# Patient Record
Sex: Male | Born: 1955 | Race: White | Hispanic: No | Marital: Married | Smoking: Never smoker
Health system: Southern US, Community
[De-identification: ages and names within clinical notes are randomized; demographics above are authoritative.]

## PROBLEM LIST (undated history)

## (undated) ENCOUNTER — Encounter: Attending: Adult Health | Primary: Adult Health

## (undated) ENCOUNTER — Ambulatory Visit

## (undated) ENCOUNTER — Ambulatory Visit: Payer: Medicare (Managed Care) | Attending: Adult Health | Primary: Adult Health

## (undated) ENCOUNTER — Ambulatory Visit: Payer: Medicare (Managed Care)

## (undated) ENCOUNTER — Encounter

## (undated) ENCOUNTER — Other Ambulatory Visit

## (undated) ENCOUNTER — Telehealth

## (undated) ENCOUNTER — Encounter: Attending: Physician Assistant | Primary: Physician Assistant

## (undated) ENCOUNTER — Ambulatory Visit: Payer: Medicare (Managed Care) | Attending: Hematology & Oncology | Primary: Hematology & Oncology

## (undated) ENCOUNTER — Encounter: Attending: Hematology & Oncology | Primary: Hematology & Oncology

## (undated) ENCOUNTER — Ambulatory Visit: Attending: Adult Health | Primary: Adult Health

## (undated) ENCOUNTER — Ambulatory Visit: Payer: PRIVATE HEALTH INSURANCE

## (undated) ENCOUNTER — Telehealth: Attending: Emergency Medicine | Primary: Emergency Medicine

## (undated) ENCOUNTER — Telehealth: Attending: Adult Health | Primary: Adult Health

## (undated) ENCOUNTER — Ambulatory Visit: Attending: Family Medicine | Primary: Family Medicine

## (undated) ENCOUNTER — Ambulatory Visit: Payer: PRIVATE HEALTH INSURANCE | Attending: Adult Health | Primary: Adult Health

## (undated) ENCOUNTER — Telehealth: Attending: Family Medicine | Primary: Family Medicine

## (undated) ENCOUNTER — Ambulatory Visit
Payer: Medicare (Managed Care) | Attending: Student in an Organized Health Care Education/Training Program | Primary: Student in an Organized Health Care Education/Training Program

## (undated) ENCOUNTER — Other Ambulatory Visit: Attending: Pharmacist | Primary: Pharmacist

## (undated) ENCOUNTER — Other Ambulatory Visit: Attending: Hematology & Oncology | Primary: Hematology & Oncology

## (undated) ENCOUNTER — Ambulatory Visit: Payer: MEDICARE | Attending: Adult Health | Primary: Adult Health

## (undated) ENCOUNTER — Ambulatory Visit: Payer: BLUE CROSS/BLUE SHIELD | Attending: Hematology & Oncology | Primary: Hematology & Oncology

## (undated) DIAGNOSIS — M1990 Unspecified osteoarthritis, unspecified site: Secondary | ICD-10-CM

## (undated) DIAGNOSIS — I10 Essential (primary) hypertension: Secondary | ICD-10-CM

## (undated) DIAGNOSIS — R7989 Other specified abnormal findings of blood chemistry: Secondary | ICD-10-CM

## (undated) DIAGNOSIS — E119 Type 2 diabetes mellitus without complications: Secondary | ICD-10-CM

## (undated) DIAGNOSIS — Z87442 Personal history of urinary calculi: Secondary | ICD-10-CM

## (undated) DIAGNOSIS — Z9989 Dependence on other enabling machines and devices: Secondary | ICD-10-CM

## (undated) DIAGNOSIS — G4733 Obstructive sleep apnea (adult) (pediatric): Secondary | ICD-10-CM

## (undated) HISTORY — DX: Dependence on other enabling machines and devices: Z99.89

## (undated) HISTORY — PX: COLONOSCOPY: SHX174

## (undated) HISTORY — PX: NOSE SURGERY: SHX723

## (undated) HISTORY — DX: Type 2 diabetes mellitus without complications: E11.9

## (undated) HISTORY — DX: Obstructive sleep apnea (adult) (pediatric): G47.33

## (undated) HISTORY — PX: LITHOTRIPSY: SUR834

## (undated) HISTORY — PX: HERNIA REPAIR: SHX51

---

## 2004-07-06 ENCOUNTER — Ambulatory Visit: Payer: Self-pay | Admitting: Internal Medicine

## 2004-07-06 ENCOUNTER — Ambulatory Visit (HOSPITAL_BASED_OUTPATIENT_CLINIC_OR_DEPARTMENT_OTHER): Admission: RE | Admit: 2004-07-06 | Discharge: 2004-07-06 | Payer: Self-pay | Admitting: Family Medicine

## 2004-07-07 ENCOUNTER — Ambulatory Visit (HOSPITAL_COMMUNITY): Admission: RE | Admit: 2004-07-07 | Discharge: 2004-07-07 | Payer: Self-pay | Admitting: Family Medicine

## 2004-12-19 ENCOUNTER — Other Ambulatory Visit: Payer: Self-pay

## 2004-12-19 ENCOUNTER — Emergency Department: Payer: Self-pay | Admitting: Internal Medicine

## 2004-12-22 ENCOUNTER — Ambulatory Visit: Payer: Self-pay | Admitting: Internal Medicine

## 2006-03-07 HISTORY — PX: KNEE ARTHROCENTESIS: SUR44

## 2007-10-04 ENCOUNTER — Ambulatory Visit (HOSPITAL_BASED_OUTPATIENT_CLINIC_OR_DEPARTMENT_OTHER): Admission: RE | Admit: 2007-10-04 | Discharge: 2007-10-04 | Payer: Self-pay | Admitting: Orthopedic Surgery

## 2007-11-30 ENCOUNTER — Ambulatory Visit: Payer: Self-pay | Admitting: Urology

## 2007-12-21 ENCOUNTER — Ambulatory Visit: Payer: Self-pay | Admitting: Urology

## 2007-12-27 ENCOUNTER — Ambulatory Visit: Payer: Self-pay | Admitting: Urology

## 2008-01-02 ENCOUNTER — Ambulatory Visit: Payer: Self-pay | Admitting: Urology

## 2008-01-09 ENCOUNTER — Ambulatory Visit: Payer: Self-pay | Admitting: Urology

## 2008-04-11 ENCOUNTER — Ambulatory Visit: Payer: Self-pay | Admitting: Urology

## 2009-01-14 ENCOUNTER — Ambulatory Visit: Payer: Self-pay | Admitting: Urology

## 2009-02-19 ENCOUNTER — Ambulatory Visit: Payer: Self-pay | Admitting: Urology

## 2009-07-01 IMAGING — CR DG ABDOMEN 1V
1 series · 2 of 2 positions shown · non-contrast
Comparison: none

REASON FOR EXAM: nephrolithiasis pt need films
COMMENTS:

[Series 1: view not recorded · 0.17mm/px · 2 of 2 slices shown]
[im 1/2]
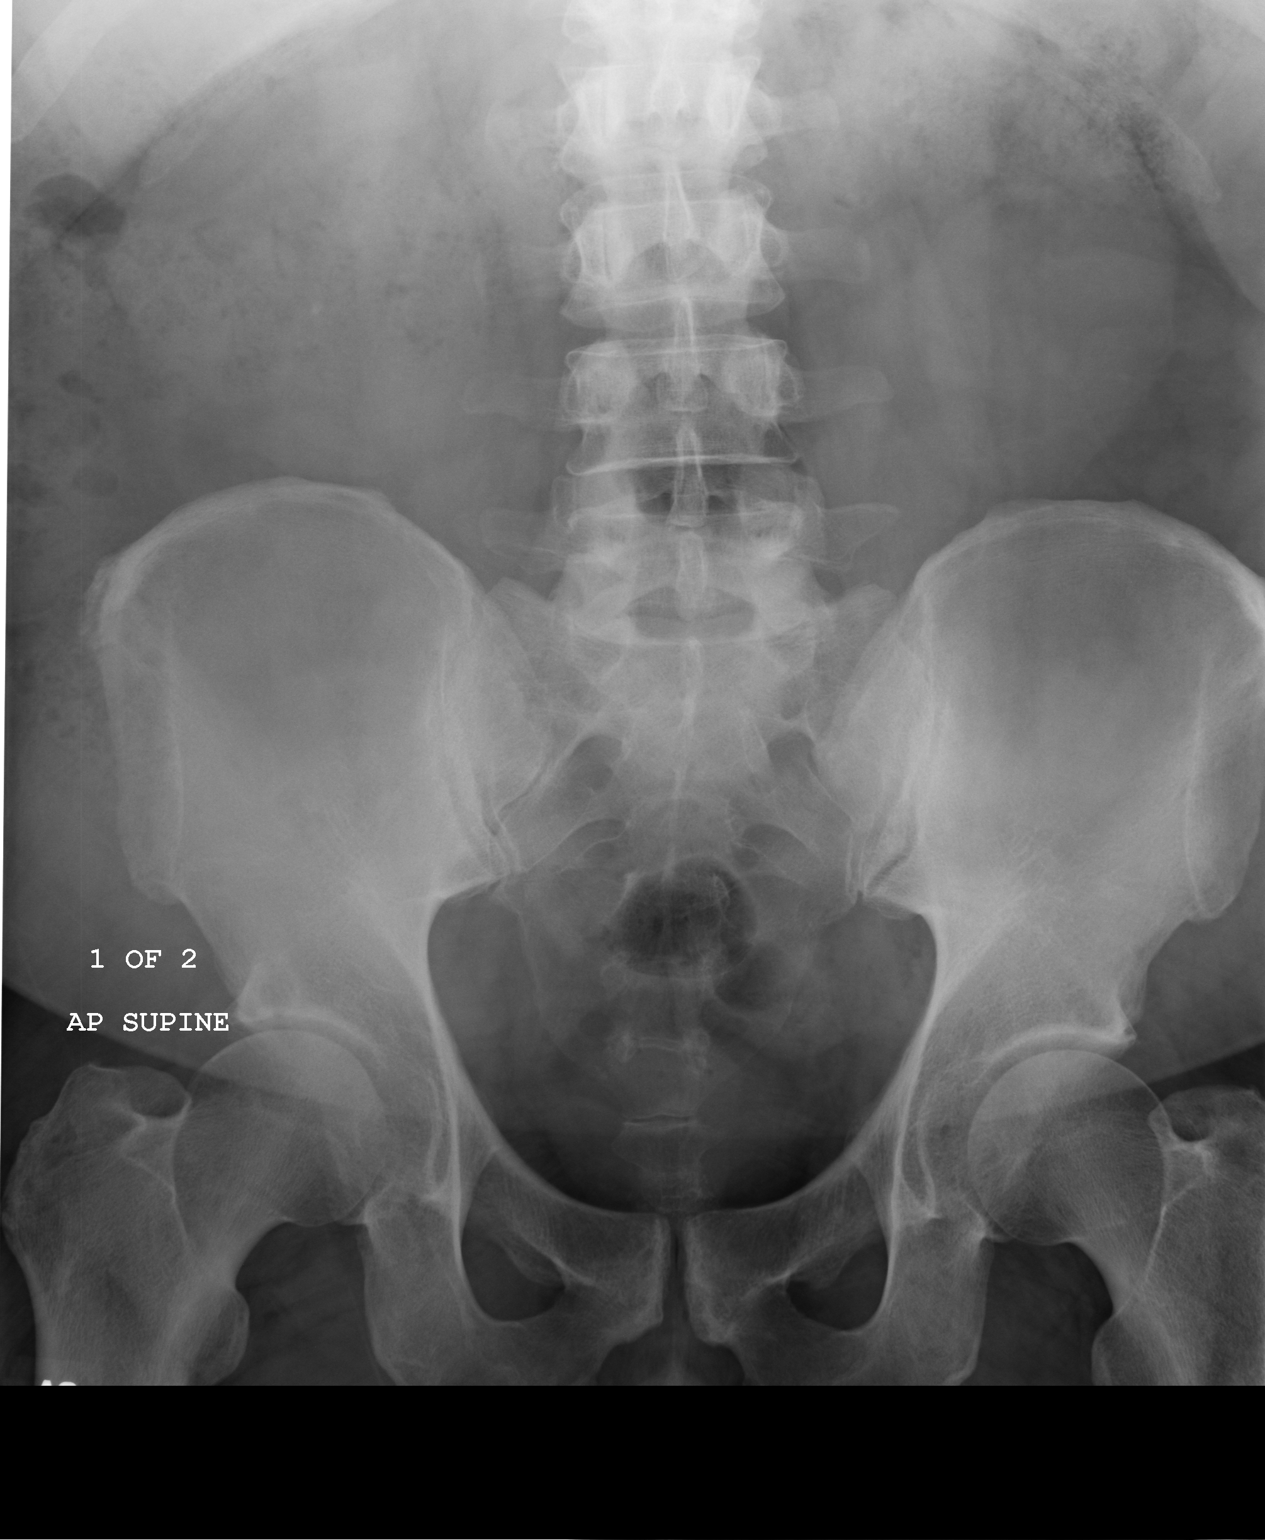
[im 2/2]
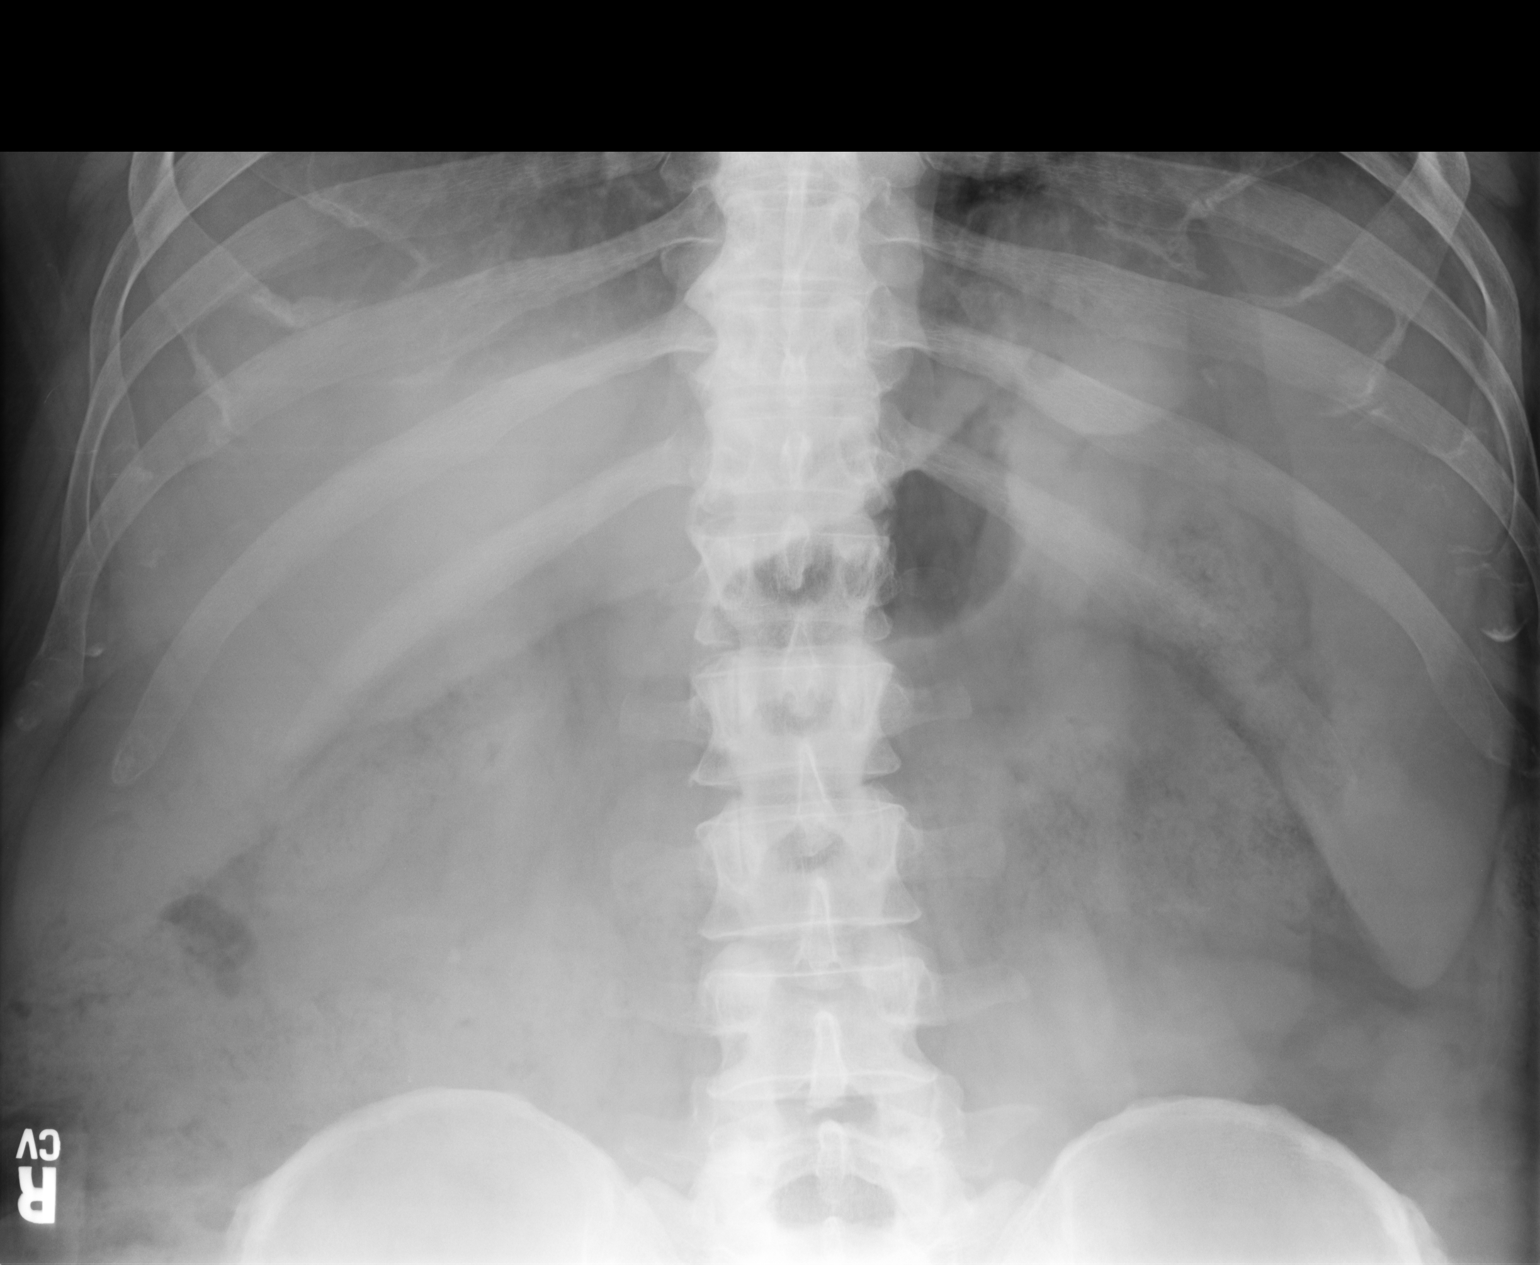

[2 of 2 positions shown; findings below may reference images not displayed]

PROCEDURE:     DXR - DXR KIDNEY URETER BLADDER  - January 02, 2008 [DATE]

RESULT:     There is a 5 mm calcification projected over the lower mid pole
region of the RIGHT kidney and consistent with a RIGHT renal stone. The
calcification observed medial to the RIGHT kidney on the exam of 12-27-2007
is no longer seen. No LEFT renal calcifications are identified. On the
current exam no definite ureteral calcifications are seen.
IMPRESSION: 1.     RIGHT nephrolithiasis.

## 2009-07-08 IMAGING — CR DG ABDOMEN 1V
1 series · 2 of 2 positions shown · non-contrast
Comparison: none

REASON FOR EXAM: NEPHROLITHIASIS PT NEED FILMS
COMMENTS:

[Series 1: view not recorded · 0.17mm/px · 2 of 2 slices shown]
[im 1/2]
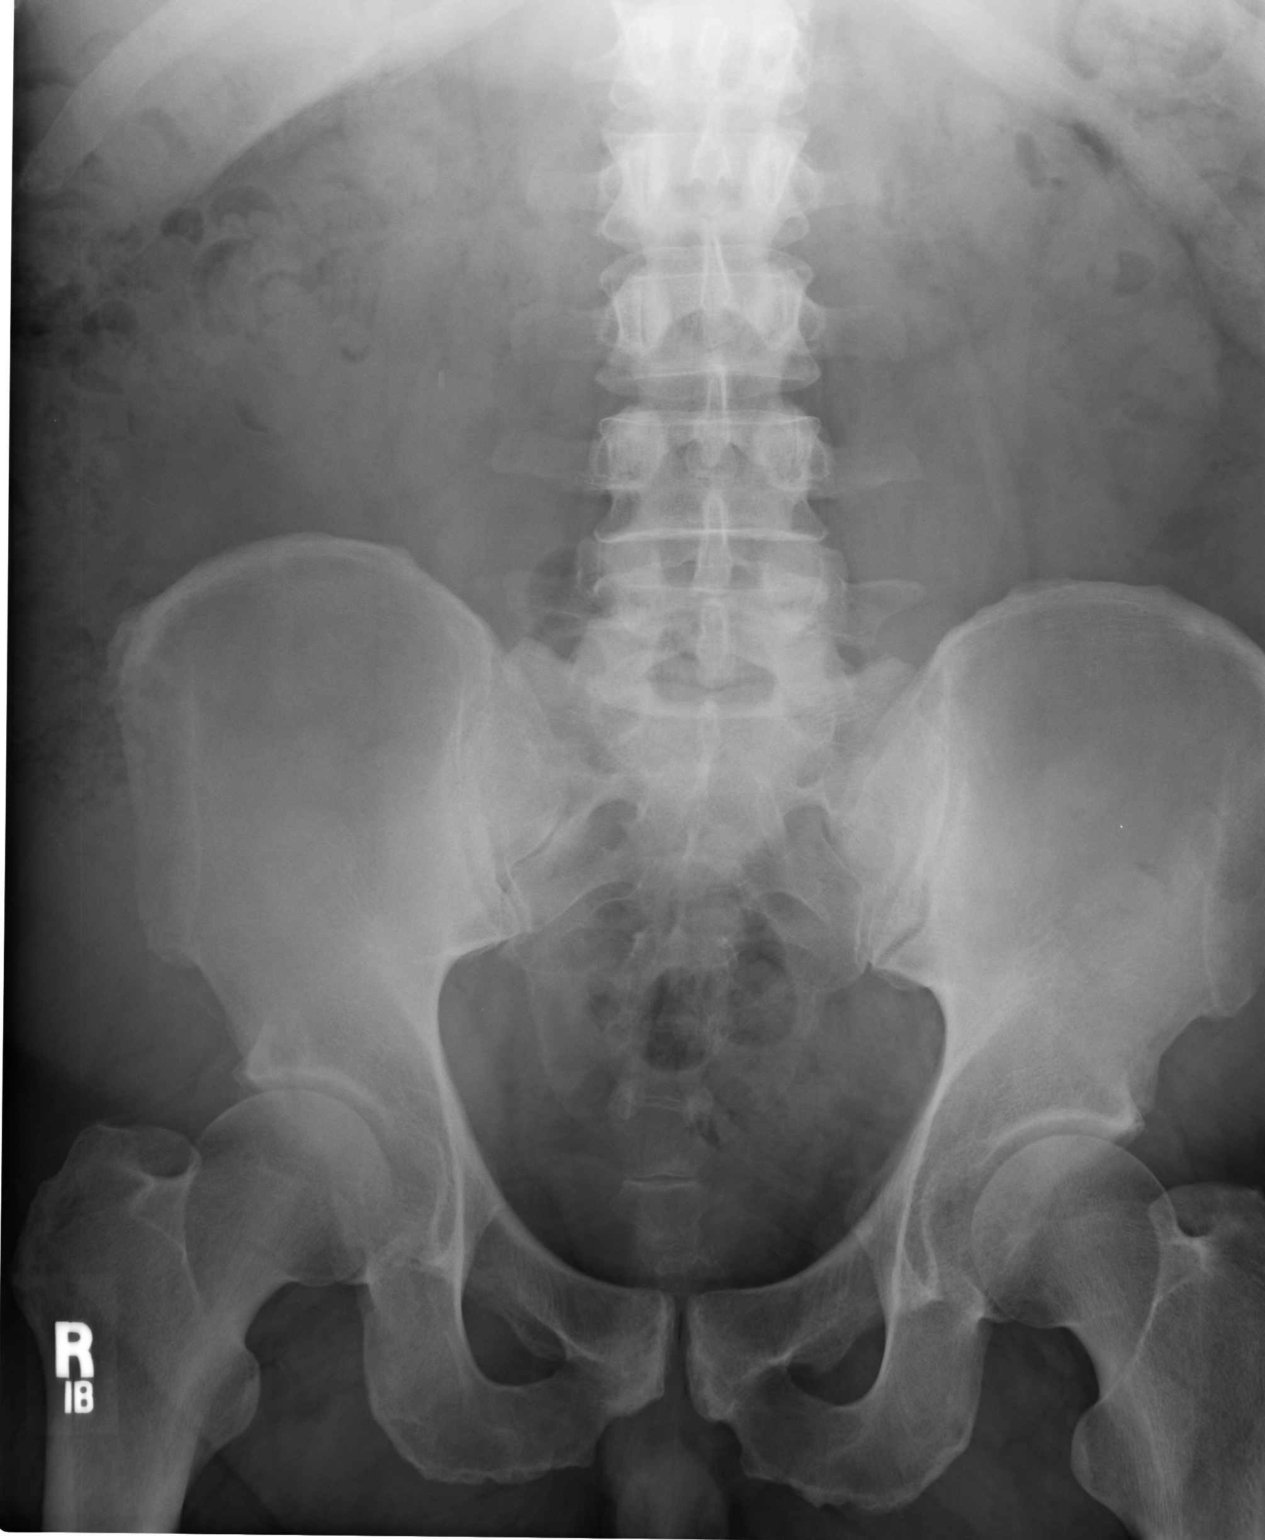
[im 2/2]
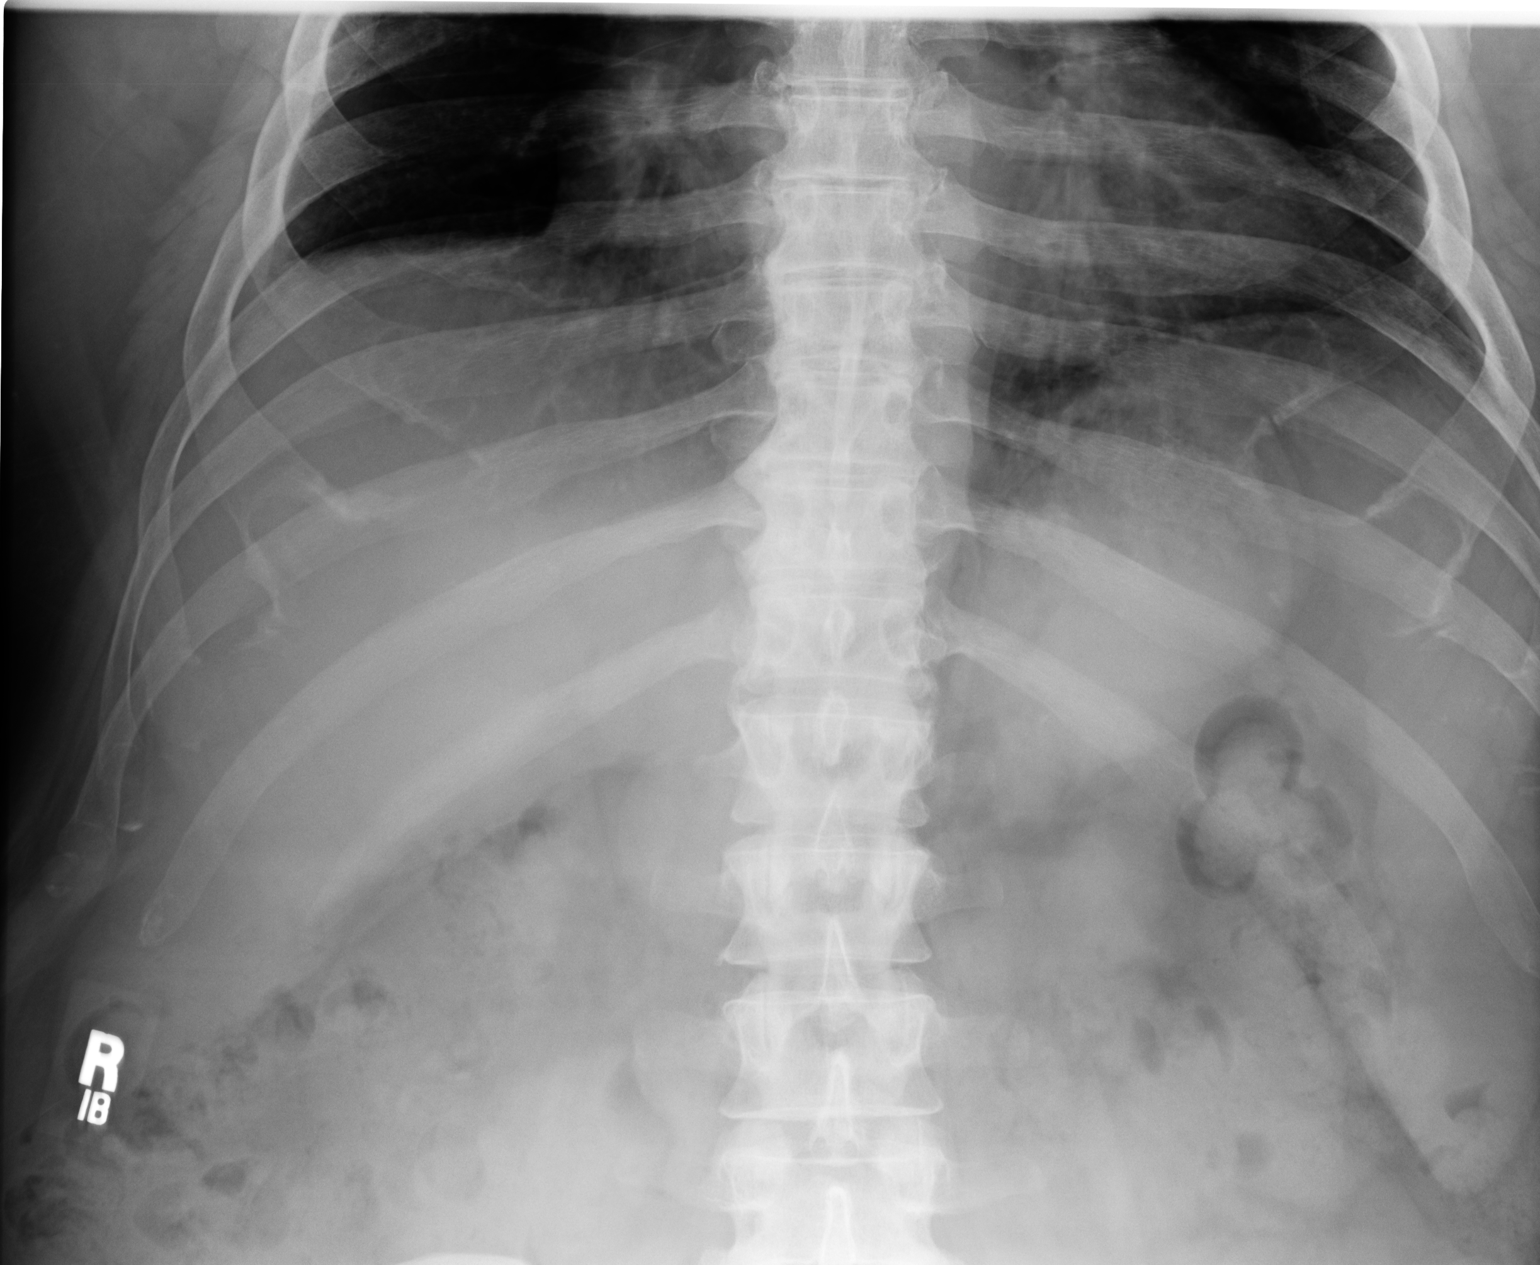

[2 of 2 positions shown; findings below may reference images not displayed]

PROCEDURE:     DXR - DXR KIDNEY URETER BLADDER  - January 09, 2008  [DATE]

RESULT:     Comparison is made to the previous examination of 12/27/2007 and
to a study dated 01/02/2008. Images demonstrate mild degenerative changes in
the spine. There is some linear calcific density projected to the RIGHT at
the L3-L4 level which is nonspecific. No definite ureteral calculus is seen
distally. No definite nephrolithiasis is evident on the LEFT.
IMPRESSION: Cannot exclude a RIGHT renal calculus. No definite ureteral calculi are
evident at this time.

## 2010-01-20 ENCOUNTER — Ambulatory Visit: Payer: Self-pay | Admitting: Urology

## 2010-06-18 ENCOUNTER — Other Ambulatory Visit: Payer: Self-pay | Admitting: Family Medicine

## 2010-06-18 DIAGNOSIS — N5089 Other specified disorders of the male genital organs: Secondary | ICD-10-CM

## 2010-06-23 ENCOUNTER — Ambulatory Visit
Admission: RE | Admit: 2010-06-23 | Discharge: 2010-06-23 | Disposition: A | Discharge status: Home / Self Care | Payer: BC Managed Care – PPO | Source: Ambulatory Visit | Attending: Family Medicine | Admitting: Family Medicine

## 2010-06-23 DIAGNOSIS — N5089 Other specified disorders of the male genital organs: Secondary | ICD-10-CM

## 2010-07-19 ENCOUNTER — Ambulatory Visit
Admission: RE | Admit: 2010-07-19 | Discharge: 2010-07-19 | Disposition: A | Discharge status: Home / Self Care | Payer: BC Managed Care – PPO | Source: Ambulatory Visit | Attending: Family Medicine | Admitting: Family Medicine

## 2010-07-19 ENCOUNTER — Other Ambulatory Visit: Payer: Self-pay | Admitting: Family Medicine

## 2010-07-20 NOTE — Op Note (Signed)
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   OPERATIVE PROCEDURE:  Left knee arthroscopy with partial medial  meniscectomy and patellofemoral chondroplasty.   ANESTHESIA:  General.   ESTIMATED BLOOD LOSS:  Minimal.   PREOPERATIVE INDICATIONS:  Mr. Lee Adkins is a 55 year old man who  complained of left medial-sided knee pain as well as some generalized  knee pain.  He elected to undergo the above-named procedures.  He failed  conservative treatment including injection therapy.  The risks,  benefits, and alternatives were discussed with him, and he is willing to  proceed.   OPERATIVE FINDINGS:  The patellofemoral joint had grade 2 changes on the  femoral trochlea, especially medially.  Laterally, it was more normal.  There was also some grade 2 changes on the undersurface of the patella.  The lateral compartment was essentially normal.  The lateral meniscus  had some slight weakening of the meniscus on the undersurface, but no  true full-thickness tear.  The ACL and PCL were intact.  The fat pad was  extremely hypertrophic.  The medial compartment had grade 2 changes on  the femur as well as grade 2 changes on the tibia with some small great  areas of grade 3 changes.  The posterior horn of the medial meniscus was  actually relatively preserved, and there was a very complex large tear  in the midportion of the body that extended around slightly  posteriorly.  The anterior horn was intact.   OPERATIVE PROCEDURE:  The patient was brought to the operating room and  placed in a supine position.  General anesthesia was administered.  We  tried to do his operation under regional, but he did not tolerate this.  Diagnostic arthroscopy was carried out with the above-named findings,  and the medial meniscus was debrided using a shaver.  The shaver was  also utilized to debride the patellofemoral joint.  The portals were  switched and in order to allow access to the more anterior portion of  the tear within the body.  Once complete debridement was carried out  and all free pieces of cartilage were removed, we irrigated the knee and  closed the wounds with Monocryl and Steri-Strips.  The patient was  awakened and returned to PACU in stable and satisfactory condition.  There were no complications.  The patient tolerated the procedure well.      Eulas Post, MD  Electronically Signed     JPL/MEDQ  D:  10/04/2007  T:  10/04/2007  Job:  [id number]

## 2010-07-23 NOTE — Procedures (Signed)
NAME:  Lee Adkins, Lee Adkins             ACCOUNT NO.:  [id number]   MEDICAL RECORD NO.:  [id number]          PATIENT TYPE:  OUT   LOCATION:  SLEEP CENTER                 FACILITY:  [facility]   PHYSICIAN:  Clinton D. Maple Hudson, M.D. DATE OF BIRTH:  [birth year]   DATE OF STUDY:  07/06/2004                              NOCTURNAL POLYSOMNOGRAM   STUDY DATE:  Jul 06, 2004   REFERRING PHYSICIAN:  Nolene Ebbs   INDICATION FOR STUDY:  Hypersomnia with obstructive sleep apnea.  Epworth  Sleepiness Score 22/24, BMI 36, weight 240 pounds.   SLEEP ARCHITECTURE:  Total sleep time 335 minutes with sleep efficiency 77%.  Stage I was 8%, stage II 76%, stages III and IV were absent, REM was 16% of  total sleep time.  Sleep latency 48 minutes, REM latency 64 minutes, awake  after sleep onset 50 minutes, arousal index increased at 66.  No sleep  medications were taken.   RESPIRATORY DATA:  Split-study protocol.  Respiratory disturbance index  (RDI, AHI) 101.2 obstructive events per hour indicating very severe  obstructive sleep apnea/hypopnea syndrome before CPAP.  This included 226  obstructive apneas, 3 mixed apneas and 13 hypopneas before CPAP.  Most  events, with most sleep, were recorded while supine but significant numbers  were also recorded while sleeping on left side.  REM RDI 42.  CPAP was  titrated to 19 CWP, RDI 5.3 per hour.  A medium Respironics Comfort2 Full  Face Mask was used with heated humidifier.   OXYGEN DATA:  Loud snoring with oxygen desaturation to a nadir of 69% before  CPAP.  After CPAP control saturation held 96-98% on room air.   CARDIAC DATA:  Occasional PAC otherwise, normal sinus rhythm.   MOVEMENT/PARASOMNIA:  A total of 64 limb jerks were recorded of which 22  were associated with arousal or awakening for a periodic limb movement with  arousal index of 3.9 per hour which is mildly increased.   IMPRESSION/RECOMMENDATION:  1.  Severe obstructive sleep apnea/hypopnea  syndrome, respiratory      disturbance index 101.2 per hour with loud snoring and oxygen      desaturation to 69%.  2.  Successful continuous positive airway pressure titration to 19 CWP,      respiratory disturbance index 5.3 per      hour, using a medium Respironics Comfort2 Full Face Mask with heated      humidifier.  3.  Mild periodic limb movement with arousal, 3.9 per hour.      CDY/MEDQ  D:  07/11/2004 09:10:23  T:  07/11/2004 09:41:47  Job:  [id number]

## 2010-09-14 ENCOUNTER — Ambulatory Visit: Payer: Self-pay | Admitting: Urology

## 2010-12-03 LAB — BASIC METABOLIC PANEL
CO2: 29
Chloride: 104
GFR calc Af Amer: >60
Glucose, Bld: 127 — ABNORMAL HIGH
Potassium: 3.8

## 2011-02-02 ENCOUNTER — Ambulatory Visit: Payer: Self-pay | Admitting: Urology

## 2011-06-23 ENCOUNTER — Other Ambulatory Visit: Payer: Self-pay | Admitting: Family Medicine

## 2011-06-23 DIAGNOSIS — R911 Solitary pulmonary nodule: Secondary | ICD-10-CM

## 2011-06-28 ENCOUNTER — Ambulatory Visit
Admission: RE | Admit: 2011-06-28 | Discharge: 2011-06-28 | Disposition: A | Discharge status: Home / Self Care | Payer: BC Managed Care – PPO | Source: Ambulatory Visit | Attending: Family Medicine | Admitting: Family Medicine

## 2011-06-28 DIAGNOSIS — R911 Solitary pulmonary nodule: Secondary | ICD-10-CM

## 2012-09-05 ENCOUNTER — Ambulatory Visit (INDEPENDENT_AMBULATORY_CARE_PROVIDER_SITE_OTHER): Payer: BC Managed Care – PPO | Admitting: Neurology

## 2012-09-05 ENCOUNTER — Ambulatory Visit (INDEPENDENT_AMBULATORY_CARE_PROVIDER_SITE_OTHER): Payer: BC Managed Care – PPO

## 2012-09-05 DIAGNOSIS — G544 Lumbosacral root disorders, not elsewhere classified: Secondary | ICD-10-CM

## 2012-09-05 DIAGNOSIS — Z0289 Encounter for other administrative examinations: Secondary | ICD-10-CM

## 2012-09-05 DIAGNOSIS — R209 Unspecified disturbances of skin sensation: Secondary | ICD-10-CM

## 2012-09-05 NOTE — Procedures (Signed)
  HISTORY:  Mr. Lee Adkins is a 57 year old gentleman with a history of obesity and diabetes who reports a one-year history of intermittent pain in the right hip with radiation down the right leg to the foot. The patient indicates that most of the time he is asymptomatic, but he may get a bout of pain down the leg once a week on average. The patient is being evaluated for a possible neuropathy or a lumbosacral radiculopathy.  NERVE CONDUCTION STUDIES:  Nerve conduction studies were performed on both lower extremities. The distal motor latencies and motor amplitudes for the peroneal and posterior tibial nerves were within normal limits. The nerve conduction velocities for these nerves were also normal. The H reflex latencies were normal. The sensory latencies for the peroneal nerves were within normal limits.    EMG STUDIES:  EMG study was performed on the right lower extremity:  The tibialis anterior muscle reveals 2 to 4K motor units with full recruitment. No fibrillations or positive waves were seen. The peroneus tertius muscle reveals 2 to 4K motor units with full recruitment. No fibrillations or positive waves were seen. The medial gastrocnemius muscle reveals 1 to 3K motor units with full recruitment. No fibrillations or positive waves were seen. The vastus lateralis muscle reveals 2 to 4K motor units with full recruitment. No fibrillations or positive waves were seen. The iliopsoas muscle reveals 2 to 4K motor units with full recruitment. No fibrillations or positive waves were seen. The biceps femoris muscle (long head) reveals 2 to 4K motor units with full recruitment. One plus fibrillations and positive waves were seen. The lumbosacral paraspinal muscles were tested at 3 levels, and revealed no abnormalities of insertional activity at the middle and lower levels tested. One plus fibrillations and positive waves were seen at the upper level There was fair  relaxation.    IMPRESSION:  Nerve conduction studies done on both lower extremities were within normal limits. There is no evidence of a peripheral neuropathy. EMG evaluation of the right lower extremity was essentially normal, but very minimal nonreproducible denervation was seen in the upper lumbosacral paraspinal muscles, and in the right biceps femoris muscle. These findings could be consistent with a very low-grade L5 or S1 radiculopathy, but this diagnosis cannot be confirmed on this study. Clinical correlation is required.  Lee Palau MD 09/05/2012 10:33 AM  [facility] [address] [place], [place] [address]  Phone [phone] Fax [phone]

## 2012-11-01 ENCOUNTER — Other Ambulatory Visit: Payer: Self-pay | Admitting: Podiatrist

## 2012-11-01 DIAGNOSIS — M79671 Pain in right foot: Secondary | ICD-10-CM

## 2012-11-20 ENCOUNTER — Ambulatory Visit
Admission: RE | Admit: 2012-11-20 | Discharge: 2012-11-20 | Disposition: A | Discharge status: Home / Self Care | Payer: BC Managed Care – PPO | Source: Ambulatory Visit | Attending: Podiatrist | Admitting: Podiatrist

## 2012-11-20 DIAGNOSIS — M79671 Pain in right foot: Secondary | ICD-10-CM

## 2012-11-20 MED ORDER — GADOBENATE DIMEGLUMINE 529 MG/ML IV SOLN
20.0000 mL | Freq: Once | INTRAVENOUS | Status: AC | PRN
  Administered 2012-11-20: 20 mL via INTRAVENOUS

## 2013-07-11 ENCOUNTER — Encounter (INDEPENDENT_AMBULATORY_CARE_PROVIDER_SITE_OTHER): Payer: Self-pay | Admitting: General Surgery

## 2013-07-11 ENCOUNTER — Ambulatory Visit (INDEPENDENT_AMBULATORY_CARE_PROVIDER_SITE_OTHER): Payer: BC Managed Care – PPO | Admitting: General Surgery

## 2013-07-11 VITALS — BP 122/76 | HR 82 | Temp 97.6°F | Resp 16 | Ht 68.0 in | Wt 238.6 lb

## 2013-07-11 DIAGNOSIS — K429 Umbilical hernia without obstruction or gangrene: Secondary | ICD-10-CM

## 2013-07-11 NOTE — Patient Instructions (Signed)
Hernia Repair with Laparoscope A hernia occurs when an internal organ pushes out through a weak spot in the belly (abdominal) wall muscles. Hernias most commonly occur in the groin and around the navel. Hernias can also occur through a cut by the surgeon (incision) after an abdominal operation. A hernia may be caused by:  Lifting heavy objects.  Prolonged coughing.  Straining to move your bowels. Hernias can often be pushed back into place (reduced). Most hernias tend to get worse over time. Problems occur when abdominal contents get stuck in the opening and the blood supply is blocked or impaired (incarcerated hernia). Because of these risks, you require surgery to repair the hernia. Your hernia will be repaired using a laparoscope. Laparoscopic surgery is a type of minimally invasive surgery. It does not involve making a typical surgical cut (incision) in the skin. A laparoscope is a telescope-like rod and lens system. It is usually connected to a video camera and a light source so your caregiver can clearly see the operative area. The instruments are inserted through  to  inch (5 mm or 10 mm) openings in the skin at specific locations. A working and viewing space is created by blowing a small amount of carbon dioxide gas into the abdominal cavity. The abdomen is essentially blown up like a balloon (insufflated). This elevates the abdominal wall above the internal organs like a dome. The carbon dioxide gas is common to the human body and can be absorbed by tissue and removed by the respiratory system. Once the repair is completed, the small incisions will be closed with either stitches (sutures) or staples (just like a paper stapler only this staple holds the skin together). LET YOUR CAREGIVERS KNOW ABOUT:  Allergies.  Medications taken including herbs, eye drops, over the counter medications, and creams.  Use of steroids (by mouth or creams).  Previous problems with anesthetics or  Novocaine.  Possibility of pregnancy, if this applies.  History of blood clots (thrombophlebitis).  History of bleeding or blood problems.  Previous surgery.  Other health problems. BEFORE THE PROCEDURE  Laparoscopy can be done either in a hospital or out-patient clinic. You may be given a mild sedative to help you relax before the procedure. Once in the operating room, you will be given a general anesthesia to make you sleep (unless you and your caregiver choose a different anesthetic).  AFTER THE PROCEDURE  After the procedure you will be watched in a recovery area. Depending on what type of hernia was repaired, you might be admitted to the hospital or you might go home the same day. With this procedure you may have less pain and scarring. This usually results in a quicker recovery and less risk of infection. HOME CARE INSTRUCTIONS   Bed rest is not required. You may continue your normal activities but avoid heavy lifting (more than 10 pounds) or straining.  Cough gently. If you are a smoker it is best to stop, as even the best hernia repair can break down with the continual strain of coughing.  Avoid driving until given the OK by your surgeon.  There are no dietary restrictions unless given otherwise.  TAKE ALL MEDICATIONS AS DIRECTED.  Only take over-the-counter or prescription medicines for pain, discomfort, or fever as directed by your caregiver. SEEK MEDICAL CARE IF:   There is increasing abdominal pain or pain in your incisions.  There is more bleeding from incisions, other than minimal spotting.  You feel light headed or faint.  You   develop an unexplained fever, chills, and/or an oral temperature above 102 F (38.9 C).  You have redness, swelling, or increasing pain in the wound.  Pus coming from wound.  A foul smell coming from the wound or dressings. SEEK IMMEDIATE MEDICAL CARE IF:   You develop a rash.  You have difficulty breathing.  You have any  allergic problems. MAKE SURE YOU:   Understand these instructions.  Will watch your condition.  Will get help right away if you are not doing well or get worse. Document Released: 02/21/2005 Document Revised: 05/16/2011 Document Reviewed: 01/21/2009 Exit[employer] Patient Information 2014 [employer].  

## 2013-07-11 NOTE — Progress Notes (Signed)
Patient ID: Lee Adkins, male   DOB: April 09, 1955, 58 y.o.   MRN: [id number]  Chief Complaint  Patient presents with  . Umbilical Hernia    HPI Lee Adkins is a 58 y.o. male.   HPI 58 year old Caucasian male comes in to discuss his umbilical hernia. He initially saw Dr. Michaell CowingGross in 2012. At that time it wasn't bothering him so he deferred surgery. He states over the past year or 2 he thinks the areas that were larger. His knowledge didn't surgery. He denies any pain or discomfort for the most part. He has bowel movements every 2-3 days which is his normal baseline. He denies any fevers or chills. He denies any prior abdominal surgery. He has some mild discomfort in the umbilical area if he gets bumped. He does have sleep apnea and uses CPAP. He has lost about 20 pounds since he was seen in 2012 Past Medical History  Diagnosis Date  . Diabetes mellitus without complication   . OSA on CPAP     Past Surgical History  Procedure Laterality Date  . Knee arthrocentesis  2008  . Nose surgery  1978 and 1984  . Colonoscopy      History reviewed. No pertinent family history.  Social History History  Substance Use Topics  . Smoking status: Never Smoker   . Smokeless tobacco: Not on file  . Alcohol Use: Yes    Allergies  Allergen Reactions  . Amoxapine And Related     Current Outpatient Prescriptions  Medication Sig Dispense Refill  . metFORMIN (GLUCOPHAGE) 500 MG tablet Take by mouth 2 (two) times daily with a meal.      . tamsulosin (FLOMAX) 0.4 MG CAPS capsule Take 0.4 mg by mouth.      . triamterene-hydrochlorothiazide (DYAZIDE) 37.5-25 MG per capsule Take 1 capsule by mouth daily.       No current facility-administered medications for this visit.    Review of Systems Review of Systems  Constitutional: Negative for fever, chills, appetite change and unexpected weight change.  HENT: Negative for congestion and trouble swallowing.   Eyes: Negative for visual disturbance.    Respiratory: Negative for chest tightness and shortness of breath.        +OSA on CPAP  Cardiovascular: Negative for chest pain and leg swelling.       No PND, no orthopnea, no DOE  Gastrointestinal:       See HPI  Genitourinary: Negative for dysuria and hematuria.  Musculoskeletal: Negative.   Skin: Negative for rash.  Neurological: Negative for seizures and speech difficulty.  Hematological: Does not bruise/bleed easily.  Psychiatric/Behavioral: Negative for behavioral problems and confusion.    Blood pressure 122/76, pulse 82, temperature 97.6 F (36.4 C), resp. rate 16, height 5\' 8"  (1.727 m), weight 238 lb 9.6 oz (108.228 kg).  Physical Exam Physical Exam  Vitals reviewed. Constitutional: He is oriented to person, place, and time. He appears well-developed and well-nourished. No distress.  obese  HENT:  Head: Normocephalic and atraumatic.  Right Ear: External ear normal.  Left Ear: External ear normal.  Eyes: Conjunctivae are normal. No scleral icterus.  Neck: Normal range of motion. Neck supple. No tracheal deviation present. No thyromegaly present.  Cardiovascular: Normal rate, normal heart sounds and intact distal pulses.   Pulmonary/Chest: Effort normal and breath sounds normal. No respiratory distress. He has no wheezes.  Abdominal: Soft. He exhibits no distension. There is no tenderness. There is no rebound and no guarding.    Partially reducible  umbilical hernia. Skin thinned over umbilicus. Nontender. Fascial defect about 2 cm. Upper midline diastasis  Musculoskeletal: Normal range of motion. He exhibits no edema and no tenderness.  Lymphadenopathy:    He has no cervical adenopathy.  Neurological: He is alert and oriented to person, place, and time. He exhibits normal muscle tone.  Skin: Skin is warm and dry. No rash noted. He is not diaphoretic. No erythema. No pallor.  Psychiatric: He has a normal mood and affect. His behavior is normal. Judgment and thought  content normal.    Data Reviewed Dr Michaell CowingGross' note  Assessment    Umbilical hernia Obesity Diastasis recti DM2 OSA on CPAP     Plan    We discussed the etiology of ventral hernias. We discussed the signs and symptoms of incarceration and strangulation. The patient was given educational material. I also drew diagrams.  We discussed nonoperative and operative management. With respect to operative management, we discussed both open repair and laparoscopic repair. We discussed the pros and cons of each approach. I discussed the typical aftercare with each procedure and how each procedure differs.  The patient has elected to laparoscopic assisted umbilical hernia repair with mesh. I explained that I would try to bring the fascia muscle back together laparoscopically and place a mesh underlay  We discussed the risk and benefits of surgery including but not limited to bleeding, infection, injury to surrounding structures, hernia recurrence, mesh complications, hematoma/seroma formation, need to convert to an open procedure, blood clot formation, urinary retention, post operative ileus, general anesthesia risk, long-term abdominal pain. We discussed that this procedure can be quite uncomfortable and difficult to recover from based on how the mesh is secured to the abdominal wall. We discussed the importance of avoiding heavy lifting and straining for a period of 4- 6 weeks.  He is going to meet with the surgery scheduler today and schedule surgery  Mary Sellaric M. Andrey CampanileWilson, MD, FACS General, Bariatric, & Minimally Invasive Surgery [facility] [place]          Atilano Inaric M Hanya Guerin 07/11/2013, 3:05 PM

## 2013-08-01 ENCOUNTER — Ambulatory Visit: Payer: Self-pay | Admitting: Urology

## 2013-09-20 ENCOUNTER — Ambulatory Visit: Admit: 2013-09-20 | Payer: Self-pay | Admitting: General Surgery

## 2013-09-20 SURGERY — REPAIR, HERNIA, UMBILICAL, LAPAROSCOPIC
Anesthesia: General

## 2013-09-27 ENCOUNTER — Other Ambulatory Visit: Payer: Self-pay | Admitting: Family Medicine

## 2013-09-27 ENCOUNTER — Ambulatory Visit
Admission: RE | Admit: 2013-09-27 | Discharge: 2013-09-27 | Disposition: A | Discharge status: Home / Self Care | Payer: BC Managed Care – PPO | Source: Ambulatory Visit | Attending: Family Medicine | Admitting: Family Medicine

## 2013-09-27 DIAGNOSIS — M542 Cervicalgia: Secondary | ICD-10-CM

## 2013-10-02 ENCOUNTER — Encounter (INDEPENDENT_AMBULATORY_CARE_PROVIDER_SITE_OTHER): Payer: Self-pay | Admitting: General Surgery

## 2013-10-02 ENCOUNTER — Ambulatory Visit (INDEPENDENT_AMBULATORY_CARE_PROVIDER_SITE_OTHER): Payer: BC Managed Care – PPO | Admitting: General Surgery

## 2013-10-02 VITALS — BP 130/76 | HR 85 | Temp 97.3°F | Resp 16 | Ht 68.0 in | Wt 232.0 lb

## 2013-10-02 DIAGNOSIS — K429 Umbilical hernia without obstruction or gangrene: Secondary | ICD-10-CM

## 2013-10-02 NOTE — Patient Instructions (Signed)
Continue to work on diet and exercise

## 2013-10-04 NOTE — Progress Notes (Signed)
Patient ID: Lee Adkins, male   DOB: 12-13-1955, 58 y.o.   MRN: [id number]  Chief Complaint  Patient presents with  . Follow-up    HPI Lee Adkins is a 58 y.o. male.   HPI 58 year old Caucasian male comes in for followup. I initially saw him back in May for an umbilical hernia. Currently he has been scheduled for laparoscopic-assisted umbilical hernia repair with mesh in mid August. He denies any changes since he was seen in May. He has continued to try to loose weight. He states he had a comprehensive physical last week and was given a good report other than encouragement for ongoing weight loss. Past Medical History  Diagnosis Date  . Diabetes mellitus without complication   . OSA on CPAP     Past Surgical History  Procedure Laterality Date  . Knee arthrocentesis  2008  . Nose surgery  1978 and 1984  . Colonoscopy      History reviewed. No pertinent family history.  Social History History  Substance Use Topics  . Smoking status: Never Smoker   . Smokeless tobacco: Not on file  . Alcohol Use: Yes    Allergies  Allergen Reactions  . Amoxapine And Related     Current Outpatient Prescriptions  Medication Sig Dispense Refill  . metFORMIN (GLUCOPHAGE) 500 MG tablet Take by mouth 2 (two) times daily with a meal.      . tamsulosin (FLOMAX) 0.4 MG CAPS capsule Take 0.4 mg by mouth.      . triamterene-hydrochlorothiazide (DYAZIDE) 37.5-25 MG per capsule Take 1 capsule by mouth daily.       No current facility-administered medications for this visit.    Review of Systems Review of Systems  Constitutional: Negative for fever, chills, appetite change and unexpected weight change.  HENT: Negative for congestion and trouble swallowing.   Eyes: Negative for visual disturbance.  Respiratory: Negative for chest tightness and shortness of breath.   Cardiovascular: Negative for chest pain and leg swelling.       No PND, no orthopnea, no DOE  Gastrointestinal:   See HPI  Genitourinary: Negative for dysuria and hematuria.  Musculoskeletal: Negative.   Skin: Negative for rash.  Neurological: Negative for seizures and speech difficulty.  Hematological: Does not bruise/bleed easily.  Psychiatric/Behavioral: Negative for behavioral problems and confusion.    Blood pressure 130/76, pulse 85, temperature 97.3 F (36.3 C), temperature source Temporal, resp. rate 16, height 5\' 8"  (1.727 m), weight 232 lb (105.235 kg).  Physical Exam Physical Exam  Vitals reviewed. Constitutional: He is oriented to person, place, and time. He appears well-developed and well-nourished. No distress.  obese  HENT:  Head: Normocephalic and atraumatic.  Right Ear: External ear normal.  Left Ear: External ear normal.  Eyes: Conjunctivae are normal. No scleral icterus.  Neck: Normal range of motion. Neck supple. No tracheal deviation present. No thyromegaly present.  Cardiovascular: Normal rate, normal heart sounds and intact distal pulses.   Pulmonary/Chest: Effort normal and breath sounds normal. No respiratory distress. He has no wheezes.  Abdominal:    Upper midline diastasis; +umbilical hernia with fat - not reducible. Defect about 2 cm. Soft, nt, nd, thin umbilical skin  Musculoskeletal: Normal range of motion. He exhibits no edema and no tenderness.  Lymphadenopathy:    He has no cervical adenopathy.  Neurological: He is alert and oriented to person, place, and time. He exhibits normal muscle tone.  Skin: Skin is warm and dry. No rash noted. He is  not diaphoretic. No erythema. No pallor.  Psychiatric: He has a normal mood and affect. His behavior is normal. Judgment and thought content normal.    Data Reviewed My note  Assessment    Umbilical hernia  Obesity  Diastasis recti  DM2  OSA on CPAP    Plan    He is currently scheduled for laparoscopic-assisted umbilical hernia repair with mesh on August 20. We rediscussed the operative procedure. We  rediscussed a typical postoperative course as well as the restrictions. All of his questions were asked and answered.  Note: This dictation was prepared with Dragon/digital dictation along with [employer] technology. Any transcriptional errors that result from this process are unintentional.  Mary SellaEric M. Andrey CampanileWilson, MD, FACS General, Bariatric, & Minimally Invasive Surgery [facility] [place]         [facility] Center[facility]ILSON,Tamberlyn Midgley M 10/04/2013, 12:03 AM

## 2013-10-16 ENCOUNTER — Encounter (HOSPITAL_COMMUNITY): Payer: Self-pay | Admitting: Pharmacy Technician

## 2013-10-18 NOTE — Progress Notes (Signed)
Dr. Andrey CampanileWilson - Gershon MusselMichael Hammers [facility]  Monday  [facility]/17 for preop - please enter preop orders in epic.  Thanks.

## 2013-10-21 ENCOUNTER — Encounter (HOSPITAL_COMMUNITY): Payer: Self-pay

## 2013-10-21 ENCOUNTER — Encounter (HOSPITAL_COMMUNITY)
Admission: RE | Admit: 2013-10-21 | Discharge: 2013-10-21 | Disposition: A | Discharge status: Home / Self Care | Payer: BC Managed Care – PPO | Source: Ambulatory Visit | Attending: General Surgery | Admitting: General Surgery

## 2013-10-21 ENCOUNTER — Other Ambulatory Visit (INDEPENDENT_AMBULATORY_CARE_PROVIDER_SITE_OTHER): Payer: Self-pay | Admitting: General Surgery

## 2013-10-21 ENCOUNTER — Ambulatory Visit (HOSPITAL_COMMUNITY)
Admission: RE | Admit: 2013-10-21 | Discharge: 2013-10-21 | Disposition: A | Discharge status: Home / Self Care | Payer: BC Managed Care – PPO | Source: Ambulatory Visit | Attending: Anesthesiology | Admitting: Anesthesiology

## 2013-10-21 DIAGNOSIS — K449 Diaphragmatic hernia without obstruction or gangrene: Secondary | ICD-10-CM | POA: Diagnosis not present

## 2013-10-21 DIAGNOSIS — Z01818 Encounter for other preprocedural examination: Secondary | ICD-10-CM | POA: Insufficient documentation

## 2013-10-21 HISTORY — DX: Personal history of urinary calculi: Z87.442

## 2013-10-21 HISTORY — DX: Unspecified osteoarthritis, unspecified site: M19.90

## 2013-10-21 LAB — BASIC METABOLIC PANEL
ANION GAP: 13 (ref 5–15)
BUN: 13 mg/dL (ref 6–23)
CALCIUM: 9.6 mg/dL (ref 8.4–10.5)
CO2: 27 mEq/L (ref 19–32)
CREATININE: 0.77 mg/dL (ref 0.50–1.35)
Chloride: 97 mEq/L (ref 96–112)
Glucose, Bld: 355 mg/dL — ABNORMAL HIGH (ref 70–99)
Potassium: 4.2 mEq/L (ref 3.7–5.3)
Sodium: 137 mEq/L (ref 137–147)

## 2013-10-21 LAB — CBC WITH DIFFERENTIAL/PLATELET
BASOS ABS: 0 10*3/uL (ref 0.0–0.1)
BASOS PCT: 1 % (ref 0–1)
Eosinophils Absolute: 0.1 10*3/uL (ref 0.0–0.7)
Eosinophils Relative: 3 % (ref 0–5)
HCT: 42.2 % (ref 39.0–52.0)
Hemoglobin: 14.5 g/dL (ref 13.0–17.0)
Lymphocytes Relative: 24 % (ref 12–46)
Lymphs Abs: 1.2 10*3/uL (ref 0.7–4.0)
MCH: 28.4 pg (ref 26.0–34.0)
MCHC: 34.4 g/dL (ref 30.0–36.0)
MCV: 82.7 fL (ref 78.0–100.0)
Monocytes Absolute: 0.4 10*3/uL (ref 0.1–1.0)
Monocytes Relative: 7 % (ref 3–12)
NEUTROS ABS: 3.2 10*3/uL (ref 1.7–7.7)
NEUTROS PCT: 65 % (ref 43–77)
PLATELETS: 124 10*3/uL — AB (ref 150–400)
RBC: 5.1 MIL/uL (ref 4.22–5.81)
RDW: 13.1 % (ref 11.5–15.5)
WBC: 4.9 10*3/uL (ref 4.0–10.5)

## 2013-10-21 NOTE — Pre-Procedure Instructions (Signed)
EKG AND CXR WERE DONE TODAY [facility] AT [facility]. PREOP CBC, DIFF, BMET REPORTS ROUTED TO DR. Tawana ScaleWILSON'S IN BASKET FOR REVIEW - BLOOD SUGAR 355 AND PLATELETS 124,000.

## 2013-10-21 NOTE — Patient Instructions (Signed)
YOUR SURGERY IS SCHEDUL[facility] AT [facility]  ON:  Thursday  8/20  REPORT TO  SHORT STAY CENTER AT: 5:30 AM   PLEASE COME IN [facility] ENTRANCE AND FOLLOW SIGNS TO SHORT STAY CENTER.  DO NOT EAT OR DRINK ANYTHING AFTER MIDNIGHT THE NIGHT BEFORE YOUR SURGERY.  YOU MAY BRUSH YOUR TEETH, RINSE OUT YOUR MOUTH--BUT NO WATER, NO FOOD, NO CHEWING GUM, NO MINTS, NO CANDIES, NO CHEWING TOBACCO.  PLEASE TAKE THE FOLLOWING MEDICATIONS THE AM OF YOUR SURGERY WITH A FEW SIPS OF WATER:  NO MEDICATIONS TO TAKE   IF YOU ARE DIABETIC:  DO NOT TAKE ANY DIABETIC MEDICATIONS THE AM OF YOUR SURGERY.     IF YOU HAVE SLEEP APNEA AND USE CPAP OR BIPAP--PLEASE BRING THE MASK AND THE TUBING.  DO NOT BRING YOUR MACHINE.  DO NOT BRING VALUABLES, MONEY, CREDIT CARDS.  DO NOT WEAR JEWELRY, MAKE-UP, NAIL POLISH AND NO METAL PINS OR CLIPS IN YOUR HAIR. CONTACT LENS, DENTURES / PARTIALS, GLASSES SHOULD NOT BE WORN TO SURGERY AND IN MOST CASES-HEARING AIDS WILL NEED TO BE REMOVED.  BRING YOUR GLASSES CASE, ANY EQUIPMENT NEEDED FOR YOUR CONTACT LENS. FOR PATIENTS ADMITTED TO THE HOSPITAL--CHECK OUT TIME THE DAY OF DISCHARGE IS 11:00 AM.  ALL INPATIENT ROOMS ARE PRIVATE - WITH BATHROOM, TELEPHONE, TELEVISION AND WIFI INTERNET.  IF YOU ARE BEING DISCHARGED THE SAME DAY OF YOUR SURGERY--YOU CAN NOT DRIVE YOURSELF HOME--AND SHOULD NOT GO HOME ALONE BY TAXI OR BUS.  NO DRIVING OR OPERATING MACHINERY, OR MAKING LEGAL DECISIONS FOR 24 HOURS FOLLOWING ANESTHESIA / PAIN MEDICATIONS.  PLEASE MAKE ARRANGEMENTS FOR SOMEONE TO BE WITH YOU AT HOME THE FIRST 24 HOURS AFTER SURGERY. RESPONSIBLE DRIVER'S NAME / PHONE    WIFE   HEIDI  Sao  [phone]                                                   PLEASE READ OVER ANY  FACT SHEETS THAT YOU WERE GIVEN: MRSA INFORMATION, BLOOD TRANSFUSION INFORMATION, INCENTIVE SPIROMETER INFORMATION.  PLEASE BE AWARE THAT YOU MAY NEED ADDITIONAL BLOOD DRAWN DAY OF YOUR  SURGERY  _______________________________________________________________________   [facility] - Preparing for [facility] Before surgery, you can play an important role.  Because skin is not sterile, your skin needs to be as free of germs as possible.  You can reduce the number of germs on your skin by washing with CHG (chlorahexidine gluconate) soap before surgery.  CHG is an antiseptic cleaner which kills germs and bonds with the skin to continue killing germs even after washing. Please DO NOT use if you have an allergy to CHG or antibacterial soaps.  If your skin becomes reddened/irritated stop using the CHG and inform your nurse when you arrive at Short Stay. Do not shave (including legs and underarms) for at least 48 hours prior to the first CHG shower.  You may shave your face/neck. Please follow these instructions carefully:  1.  Shower with CHG Soap the night before surgery and the  morning of Surgery.  2.  If you choose to wash your hair, wash your hair first as usual with your  normal  shampoo.  3.  After you shampoo, rinse your hair and body thoroughly to remove the  shampoo.  4.  Use CHG as you would any other liquid soap.  You can apply chg directly  to the skin and wash                       Gently with a scrungie or clean washcloth.  5.  Apply the CHG Soap to your body ONLY FROM THE NECK DOWN.   Do not use on face/ open                           Wound or open sores. Avoid contact with eyes, ears mouth and genitals (private parts).                       Wash face,  Genitals (private parts) with your normal soap.             6.  Wash thoroughly, paying special attention to the area where your surgery  will be performed.  7.  Thoroughly rinse your body with warm water from the neck down.  8.  DO NOT shower/wash with your normal soap after using and rinsing off  the CHG Soap.                9.  Pat yourself dry with a clean towel.            10.  Wear clean  pajamas.            11.  Place clean sheets on your bed the night of your first shower and do not  sleep with pets. Day of Surgery : Do not apply any lotions/deodorants the morning of surgery.  Please wear clean clothes to the hospital/surgery center.  FAILURE TO FOLLOW THESE INSTRUCTIONS MAY RESULT IN THE CANCELLATION OF YOUR SURGERY PATIENT SIGNATURE_________________________________  NURSE SIGNATURE__________________________________  ________________________________________________________________________

## 2013-10-22 NOTE — Pre-Procedure Instructions (Signed)
DR. Andrey CampanileWILSON HAS REVIEWED IN EPIC PT'S ABNORMAL LABS

## 2013-10-24 ENCOUNTER — Encounter (HOSPITAL_COMMUNITY): Payer: Self-pay | Admitting: *Deleted

## 2013-10-24 ENCOUNTER — Encounter (HOSPITAL_COMMUNITY)
Admission: RE | Disposition: A | Discharge status: Home / Self Care | Payer: Self-pay | Source: Ambulatory Visit | Attending: General Surgery

## 2013-10-24 ENCOUNTER — Ambulatory Visit (HOSPITAL_COMMUNITY): Payer: BC Managed Care – PPO | Admitting: Certified Registered Nurse Anesthetist

## 2013-10-24 ENCOUNTER — Encounter (HOSPITAL_COMMUNITY): Payer: BC Managed Care – PPO | Admitting: Certified Registered Nurse Anesthetist

## 2013-10-24 ENCOUNTER — Ambulatory Visit (HOSPITAL_COMMUNITY)
Admission: RE | Admit: 2013-10-24 | Discharge: 2013-10-25 | Disposition: A | Discharge status: Home / Self Care | Payer: BC Managed Care – PPO | Source: Ambulatory Visit | Attending: General Surgery | Admitting: General Surgery

## 2013-10-24 DIAGNOSIS — IMO0001 Reserved for inherently not codable concepts without codable children: Secondary | ICD-10-CM | POA: Diagnosis present

## 2013-10-24 DIAGNOSIS — E1165 Type 2 diabetes mellitus with hyperglycemia: Secondary | ICD-10-CM

## 2013-10-24 DIAGNOSIS — G4733 Obstructive sleep apnea (adult) (pediatric): Secondary | ICD-10-CM | POA: Diagnosis present

## 2013-10-24 DIAGNOSIS — Z79899 Other long term (current) drug therapy: Secondary | ICD-10-CM | POA: Diagnosis not present

## 2013-10-24 DIAGNOSIS — E119 Type 2 diabetes mellitus without complications: Secondary | ICD-10-CM | POA: Insufficient documentation

## 2013-10-24 DIAGNOSIS — Z9989 Dependence on other enabling machines and devices: Secondary | ICD-10-CM

## 2013-10-24 DIAGNOSIS — K429 Umbilical hernia without obstruction or gangrene: Secondary | ICD-10-CM | POA: Insufficient documentation

## 2013-10-24 DIAGNOSIS — E669 Obesity, unspecified: Secondary | ICD-10-CM | POA: Diagnosis not present

## 2013-10-24 HISTORY — PX: UMBILICAL HERNIA REPAIR: SHX196

## 2013-10-24 HISTORY — PX: INSERTION OF MESH: SHX5868

## 2013-10-24 LAB — GLUCOSE, CAPILLARY
Glucose-Capillary: 179 mg/dL — ABNORMAL HIGH (ref 70–99)
Glucose-Capillary: 222 mg/dL — ABNORMAL HIGH (ref 70–99)
Glucose-Capillary: 250 mg/dL — ABNORMAL HIGH (ref 70–99)
Glucose-Capillary: 297 mg/dL — ABNORMAL HIGH (ref 70–99)
Glucose-Capillary: 318 mg/dL — ABNORMAL HIGH (ref 70–99)
Glucose-Capillary: 345 mg/dL — ABNORMAL HIGH (ref 70–99)

## 2013-10-24 SURGERY — REPAIR, HERNIA, UMBILICAL, LAPAROSCOPIC
Anesthesia: General

## 2013-10-24 MED ORDER — INSULIN ASPART 100 UNIT/ML ~~LOC~~ SOLN
0.0000 [IU] | SUBCUTANEOUS | Status: DC
  Administered 2013-10-24: 15 [IU] via SUBCUTANEOUS
  Administered 2013-10-24: 7 [IU] via SUBCUTANEOUS
  Administered 2013-10-24: 11 [IU] via SUBCUTANEOUS
  Administered 2013-10-25: 7 [IU] via SUBCUTANEOUS
  Administered 2013-10-25: 11 [IU] via SUBCUTANEOUS
  Administered 2013-10-25: 7 [IU] via SUBCUTANEOUS

## 2013-10-24 MED ORDER — DEXAMETHASONE SODIUM PHOSPHATE 10 MG/ML IJ SOLN: INTRAMUSCULAR | Status: DC | PRN

## 2013-10-24 MED ORDER — PANTOPRAZOLE SODIUM 40 MG IV SOLR
40.0000 mg | INTRAVENOUS | Status: DC
  Administered 2013-10-24: 40 mg via INTRAVENOUS
  Filled 2013-10-24 (×2): qty 40

## 2013-10-24 MED ORDER — MEPERIDINE HCL 50 MG/ML IJ SOLN: 6.2500 mg | INTRAMUSCULAR | Status: DC | PRN

## 2013-10-24 MED ORDER — FENTANYL CITRATE 0.05 MG/ML IJ SOLN
INTRAMUSCULAR | Status: DC | PRN
  Administered 2013-10-24 (×2): 50 ug via INTRAVENOUS
  Administered 2013-10-24: 100 ug via INTRAVENOUS
  Administered 2013-10-24: 50 ug via INTRAVENOUS

## 2013-10-24 MED ORDER — MORPHINE SULFATE 2 MG/ML IJ SOLN
1.0000 mg | INTRAMUSCULAR | Status: DC | PRN
  Administered 2013-10-24: 1 mg via INTRAVENOUS
  Filled 2013-10-24 (×2): qty 1

## 2013-10-24 MED ORDER — INSULIN ASPART 100 UNIT/ML ~~LOC~~ SOLN
20.0000 [IU] | Freq: Once | SUBCUTANEOUS | Status: AC
  Administered 2013-10-24: 20 [IU] via SUBCUTANEOUS
  Filled 2013-10-24: qty 1

## 2013-10-24 MED ORDER — SUCCINYLCHOLINE CHLORIDE 20 MG/ML IJ SOLN
INTRAMUSCULAR | Status: DC | PRN
  Administered 2013-10-24: 100 mg via INTRAVENOUS

## 2013-10-24 MED ORDER — ACETAMINOPHEN 325 MG PO TABS: 650.0000 mg | ORAL_TABLET | ORAL | Status: DC | PRN

## 2013-10-24 MED ORDER — ONDANSETRON HCL 4 MG PO TABS: 4.0000 mg | ORAL_TABLET | Freq: Four times a day (QID) | ORAL | Status: DC | PRN

## 2013-10-24 MED ORDER — PROPOFOL 10 MG/ML IV BOLUS
INTRAVENOUS | Status: DC | PRN
  Administered 2013-10-24: 200 mg via INTRAVENOUS

## 2013-10-24 MED ORDER — INSULIN ASPART 100 UNIT/ML ~~LOC~~ SOLN
SUBCUTANEOUS | Status: AC
  Filled 2013-10-24: qty 1

## 2013-10-24 MED ORDER — POTASSIUM CHLORIDE IN NACL 20-0.45 MEQ/L-% IV SOLN
INTRAVENOUS | Status: DC
  Administered 2013-10-24: 11:00:00 via INTRAVENOUS
  Administered 2013-10-25: 50 mL via INTRAVENOUS
  Filled 2013-10-24 (×4): qty 1000

## 2013-10-24 MED ORDER — MIDAZOLAM HCL 5 MG/5ML IJ SOLN
INTRAMUSCULAR | Status: DC | PRN
  Administered 2013-10-24: 2 mg via INTRAVENOUS

## 2013-10-24 MED ORDER — LIDOCAINE HCL (CARDIAC) 20 MG/ML IV SOLN
INTRAVENOUS | Status: DC | PRN
  Administered 2013-10-24: 100 mg via INTRAVENOUS

## 2013-10-24 MED ORDER — LACTATED RINGERS IV SOLN
INTRAVENOUS | Status: DC | PRN
  Administered 2013-10-24 (×2): via INTRAVENOUS

## 2013-10-24 MED ORDER — ENOXAPARIN SODIUM 40 MG/0.4ML ~~LOC~~ SOLN
40.0000 mg | SUBCUTANEOUS | Status: DC
  Administered 2013-10-25: 40 mg via SUBCUTANEOUS
  Filled 2013-10-24 (×2): qty 0.4

## 2013-10-24 MED ORDER — PROMETHAZINE HCL 25 MG/ML IJ SOLN: 6.2500 mg | INTRAMUSCULAR | Status: DC | PRN

## 2013-10-24 MED ORDER — BUPIVACAINE-EPINEPHRINE 0.25% -1:200000 IJ SOLN
INTRAMUSCULAR | Status: AC
  Filled 2013-10-24: qty 1

## 2013-10-24 MED ORDER — LACTATED RINGERS IV SOLN: INTRAVENOUS | Status: DC

## 2013-10-24 MED ORDER — ONDANSETRON HCL 4 MG/2ML IJ SOLN: 4.0000 mg | Freq: Four times a day (QID) | INTRAMUSCULAR | Status: DC | PRN

## 2013-10-24 MED ORDER — GLYCOPYRROLATE 0.2 MG/ML IJ SOLN
INTRAMUSCULAR | Status: DC | PRN
  Administered 2013-10-24: 0.6 mg via INTRAVENOUS

## 2013-10-24 MED ORDER — TRIAMTERENE-HCTZ 37.5-25 MG PO CAPS
1.0000 | ORAL_CAPSULE | Freq: Every day | ORAL | Status: DC | PRN
  Filled 2013-10-24: qty 1

## 2013-10-24 MED ORDER — FENTANYL CITRATE 0.05 MG/ML IJ SOLN: 25.0000 ug | INTRAMUSCULAR | Status: DC | PRN

## 2013-10-24 MED ORDER — TRIAMTERENE-HCTZ 37.5-25 MG PO TABS
1.0000 | ORAL_TABLET | Freq: Every day | ORAL | Status: DC | PRN
  Filled 2013-10-24: qty 1

## 2013-10-24 MED ORDER — FENTANYL CITRATE 0.05 MG/ML IJ SOLN
INTRAMUSCULAR | Status: AC
  Filled 2013-10-24: qty 5

## 2013-10-24 MED ORDER — CIPROFLOXACIN IN D5W 400 MG/200ML IV SOLN
400.0000 mg | INTRAVENOUS | Status: AC
  Administered 2013-10-24: 400 mg via INTRAVENOUS

## 2013-10-24 MED ORDER — GLYCOPYRROLATE 0.2 MG/ML IJ SOLN
INTRAMUSCULAR | Status: AC
  Filled 2013-10-24: qty 3

## 2013-10-24 MED ORDER — PHENYLEPHRINE HCL 10 MG/ML IJ SOLN
INTRAMUSCULAR | Status: DC | PRN
  Administered 2013-10-24 (×2): 80 ug via INTRAVENOUS

## 2013-10-24 MED ORDER — ONDANSETRON HCL 4 MG/2ML IJ SOLN
INTRAMUSCULAR | Status: AC
  Filled 2013-10-24: qty 2

## 2013-10-24 MED ORDER — NEOSTIGMINE METHYLSULFATE 10 MG/10ML IV SOLN
INTRAVENOUS | Status: DC | PRN
  Administered 2013-10-24: 5 mg via INTRAVENOUS

## 2013-10-24 MED ORDER — ROCURONIUM BROMIDE 100 MG/10ML IV SOLN
INTRAVENOUS | Status: DC | PRN
  Administered 2013-10-24: 40 mg via INTRAVENOUS
  Administered 2013-10-24: 5 mg via INTRAVENOUS

## 2013-10-24 MED ORDER — ROCURONIUM BROMIDE 100 MG/10ML IV SOLN
INTRAVENOUS | Status: AC
  Filled 2013-10-24: qty 1

## 2013-10-24 MED ORDER — MIDAZOLAM HCL 2 MG/2ML IJ SOLN
INTRAMUSCULAR | Status: AC
  Filled 2013-10-24: qty 2

## 2013-10-24 MED ORDER — INSULIN ASPART 100 UNIT/ML ~~LOC~~ SOLN
SUBCUTANEOUS | Status: DC | PRN
  Administered 2013-10-24: 5 [IU] via INTRAVENOUS

## 2013-10-24 MED ORDER — OXYCODONE-ACETAMINOPHEN 5-325 MG PO TABS
1.0000 | ORAL_TABLET | ORAL | Status: DC | PRN
  Administered 2013-10-24: 1 via ORAL
  Filled 2013-10-24: qty 1

## 2013-10-24 MED ORDER — METFORMIN HCL ER 750 MG PO TB24
1500.0000 mg | ORAL_TABLET | Freq: Every day | ORAL | Status: DC
  Administered 2013-10-25: 1500 mg via ORAL
  Filled 2013-10-24 (×2): qty 2

## 2013-10-24 MED ORDER — PROPOFOL 10 MG/ML IV BOLUS
INTRAVENOUS | Status: AC
  Filled 2013-10-24: qty 20

## 2013-10-24 MED ORDER — BUPIVACAINE-EPINEPHRINE 0.25% -1:200000 IJ SOLN
INTRAMUSCULAR | Status: DC | PRN
  Administered 2013-10-24: 50 mL

## 2013-10-24 MED ORDER — LIDOCAINE HCL (CARDIAC) 20 MG/ML IV SOLN
INTRAVENOUS | Status: AC
Start: 2013-10-24 — End: 2013-10-24
  Filled 2013-10-24: qty 5

## 2013-10-24 MED ORDER — 0.9 % SODIUM CHLORIDE (POUR BTL) OPTIME
TOPICAL | Status: DC | PRN
  Administered 2013-10-24: 1000 mL

## 2013-10-24 MED ORDER — DEXAMETHASONE SODIUM PHOSPHATE 10 MG/ML IJ SOLN
INTRAMUSCULAR | Status: AC
  Filled 2013-10-24: qty 1

## 2013-10-24 MED ORDER — PHENYLEPHRINE 40 MCG/ML (10ML) SYRINGE FOR IV PUSH (FOR BLOOD PRESSURE SUPPORT)
PREFILLED_SYRINGE | INTRAVENOUS | Status: AC
  Filled 2013-10-24: qty 10

## 2013-10-24 MED ORDER — NEOSTIGMINE METHYLSULFATE 10 MG/10ML IV SOLN
INTRAVENOUS | Status: AC
  Filled 2013-10-24: qty 1

## 2013-10-24 MED ORDER — ONDANSETRON HCL 4 MG/2ML IJ SOLN
INTRAMUSCULAR | Status: DC | PRN
  Administered 2013-10-24: 4 mg via INTRAVENOUS

## 2013-10-24 MED ORDER — KETOROLAC TROMETHAMINE 30 MG/ML IJ SOLN
30.0000 mg | Freq: Four times a day (QID) | INTRAMUSCULAR | Status: DC
  Administered 2013-10-24 – 2013-10-25 (×4): 30 mg via INTRAVENOUS
  Filled 2013-10-24 (×8): qty 1

## 2013-10-24 MED ORDER — CIPROFLOXACIN IN D5W 400 MG/200ML IV SOLN
INTRAVENOUS | Status: AC
  Filled 2013-10-24: qty 200

## 2013-10-24 SURGICAL SUPPLY — 50 items
ADH SKN CLS APL DERMABOND .7 (GAUZE/BANDAGES/DRESSINGS) ×1
APPLIER CLIP LOGIC TI 5 (MISCELLANEOUS) ×1 IMPLANT
APR CLP MED LRG 33X5 (MISCELLANEOUS)
BINDER ABDOMINAL 12 ML 46-62 (SOFTGOODS) ×2 IMPLANT
BLADE HEX COATED 2.75 (ELECTRODE) ×1 IMPLANT
CABLE HIGH FREQUENCY MONO STRZ (ELECTRODE) IMPLANT
CANISTER SUCTION 2500CC (MISCELLANEOUS) ×1 IMPLANT
CHLORAPREP W/TINT 26ML (MISCELLANEOUS) ×3 IMPLANT
DECANTER SPIKE VIAL GLASS SM (MISCELLANEOUS) ×2 IMPLANT
DERMABOND ADVANCED (GAUZE/BANDAGES/DRESSINGS) ×1
DERMABOND ADVANCED .7 DNX12 (GAUZE/BANDAGES/DRESSINGS) IMPLANT
DEVICE RELIATACK FIXATION (MISCELLANEOUS) ×1 IMPLANT
DEVICE SECURE STRAP 25 ABSORB (INSTRUMENTS) IMPLANT
DEVICE TROCAR PUNCTURE CLOSURE (ENDOMECHANICALS) ×2 IMPLANT
DRAPE INCISE IOBAN 66X45 STRL (DRAPES) ×1 IMPLANT
DRAPE LAPAROSCOPIC ABDOMINAL (DRAPES) ×2 IMPLANT
DRAPE UTILITY XL STRL (DRAPES) ×2 IMPLANT
ELECT CAUTERY BLADE 6.4 (BLADE) ×1 IMPLANT
ELECT REM PT RETURN 9FT ADLT (ELECTROSURGICAL) ×2
ELECTRODE REM PT RTRN 9FT ADLT (ELECTROSURGICAL) ×1 IMPLANT
GLOVE BIOGEL M STRL SZ7.5 (GLOVE) IMPLANT
GOWN STRL REUS W/TWL LRG LVL3 (GOWN DISPOSABLE) ×1 IMPLANT
GOWN STRL REUS W/TWL XL LVL3 (GOWN DISPOSABLE) ×5 IMPLANT
HOLDER FOLEY CATH W/STRAP (MISCELLANEOUS) ×1 IMPLANT
KIT BASIN OR (CUSTOM PROCEDURE TRAY) ×2 IMPLANT
MARKER SKIN DUAL TIP RULER LAB (MISCELLANEOUS) ×2 IMPLANT
MESH VENTRALIGHT ST 4.5IN (Mesh General) ×1 IMPLANT
NDL SPNL 22GX3.5 QUINCKE BK (NEEDLE) ×1 IMPLANT
NEEDLE SPNL 22GX3.5 QUINCKE BK (NEEDLE) ×2 IMPLANT
NS IRRIG 1000ML POUR BTL (IV SOLUTION) ×2 IMPLANT
PENCIL BUTTON HOLSTER BLD 10FT (ELECTRODE) ×1 IMPLANT
SCISSORS LAP 5X35 DISP (ENDOMECHANICALS) IMPLANT
SET IRRIG TUBING LAPAROSCOPIC (IRRIGATION / IRRIGATOR) IMPLANT
SHEARS HARMONIC ACE PLUS 36CM (ENDOMECHANICALS) IMPLANT
SLEEVE ENDOPATH XCEL 5M (ENDOMECHANICALS) ×1 IMPLANT
SLEEVE XCEL OPT CAN 5 100 (ENDOMECHANICALS) IMPLANT
SOLUTION ANTI FOG 6CC (MISCELLANEOUS) ×2 IMPLANT
STAPLER VISISTAT 35W (STAPLE) IMPLANT
SUT MNCRL AB 4-0 PS2 18 (SUTURE) ×2 IMPLANT
SUT NOVA NAB GS-21 0 18 T12 DT (SUTURE) ×3 IMPLANT
SUT VIC AB 3-0 SH 18 (SUTURE) ×1 IMPLANT
TOWEL OR 17X26 10 PK STRL BLUE (TOWEL DISPOSABLE) ×2 IMPLANT
TRAY FOLEY CATH 14FRSI W/METER (CATHETERS) ×1 IMPLANT
TRAY FOLEY CATH 16FRSI W/METER (SET/KITS/TRAYS/PACK) ×1 IMPLANT
TRAY LAP CHOLE (CUSTOM PROCEDURE TRAY) ×2 IMPLANT
TROCAR BLADELESS OPT 5 75 (ENDOMECHANICALS) ×1 IMPLANT
TROCAR XCEL BLUNT TIP 100MML (ENDOMECHANICALS) IMPLANT
TROCAR XCEL NON-BLD 11X100MML (ENDOMECHANICALS) ×2 IMPLANT
TROCAR XCEL NON-BLD 5MMX100MML (ENDOMECHANICALS) ×1 IMPLANT
TUBING INSUFFLATION 10FT LAP (TUBING) ×2 IMPLANT

## 2013-10-24 NOTE — Interval H&P Note (Signed)
History and Physical Interval Note:  10/24/2013 7:24 AM  Veryl SpeakMichael T Urquiza  has presented today for surgery, with the diagnosis of umbilical hernia  The various methods of treatment have been discussed with the patient and family. After consideration of risks, benefits and other options for treatment, the patient has consented to  Procedure(s): LAPAROSCOPIC ASSISTED UMBILICAL HERNIA (N/A) INSERTION OF MESH (N/A) as a surgical intervention .  The patient's history has been reviewed, patient examined, no change in status, stable for surgery.  I have reviewed the patient's chart and labs.  Questions were answered to the patient's satisfaction.   Mary SellaEric M. Andrey CampanileWilson, MD, FACS General, Bariatric, & Minimally Invasive Surgery [facility] [place]    [facility],Scarlettrose Costilow M

## 2013-10-24 NOTE — Op Note (Signed)
Sami T Tursi [id number] [birth year]Veryl Speak61 10/24/2013  Laparoscopic Assisted Umbilical Hernia Repair Procedure Note  Indications: Symptomatic umbilical hernia. See h&p for additional details  Pre-operative Diagnosis: umbilical hernia  Post-operative Diagnosis: umbilical hernia  Surgeon: Atilano InaWILSON,Kaysen Deal M   Assistants: none  Anesthesia: General endotracheal anesthesia  ASA Class: 3  Procedure Details  The patient was seen in the Holding Room. The risks, benefits, complications, treatment options, and expected outcomes were discussed with the patient. The possibilities of reaction to medication, pulmonary aspiration, perforation of viscus, bleeding, recurrent infection, the need for additional procedures, failure to diagnose a condition, and creating a complication requiring transfusion or operation were discussed with the patient. The patient concurred with the proposed plan, giving informed consent.  The site of surgery properly noted/marked. The patient was taken to the operating room, identified as Veryl SpeakMichael T Faught and the procedure verified as laparoscopic assisted umbilical hernia repair with mesh. A Time Out was held and the above information confirmed.    The patient was placed supine.  After establishing general anesthesia, a Foley catheter was placed.  The abdomen was prepped with Chloraprep and draped in standard fashion.  A 5 mm Optiview was used the cannulate the peritoneal cavity in the left upper quadrant below the costal margin.  Pneumoperitoneum was obtained by insufflating CO2, maintaining a maximum pressure of 15 mmHg.  The 5 mm 30-degree laparoscopic was inserted.  There were no significant omental adhesions to the anterior abdominal wall in and around the hernia defect.  There was a plug of omental fat thru the umbilical defect. An 11-mm port was placed in the left anterior axillary line at the level of the umbilicus.  Jola BabinskiMarilyn dissector with and without electrocautery and gentle  traction were used to dissect the omental adhesions away from the umbilical defect.  We cleared the entire abdominal wall and were able to visualize 1 fascial defects which was approximately 2.5cm. Pneumoperitoneum was released.  A curvilinear infraumbilical incision was created. Dissection was carried down to the hernia sac located above the fascia and was mobilized from surrounding structures. Intact fascia was identified circumferentially around the defect. Skin and soft tissue was mobilized from the surface of the fascia in a circumferential manner. A finger sweep was performed underneath the fascia to ensure there were no adhesions to the anterior abdominal wall. The defect was 2.5 x 2.5cms. The fascia was then closed primarily over the mesh with 4 interrupted 0-novafil sutures. The cavity was irrigated and additional local was infiltrated in the subcutaneous tissue and fascia. The umbilical stalk was then tacked back down to the fascia with two 3-0 vicryl sutures. Hemostasis was confirmed. The soft tissue was irrigated and closed in layers with inverted interrupted 3-0 vicryl sutures for the deep dermis. The skin incision was closed with a 4-0 monocryl subcuticular closure.  We then re-established pneumoperitoneum. We selected a 4.5 inch round piece of Bard VentralightST mesh.  We placed 4 stay sutures of 0 Novofil around the edges of the mesh.  The mesh was then rolled up and inserted through the 11 mm port site.  The mesh was then unrolled.  The stay sutures were then pulled up through small stab incisions using the Endo-close device.  This deployed the mesh widely over the fascial defects.  The stay sutures were then tied down.  The Covidien absorbatack device was then used to tack down the edges of the mesh at 1 cm intervals circumferentially.  We placed a few tacks inside the outer ring  of tacks.  We inspected for hemostasis. I inspected the area where I went in with the Optiview and there was no evidence  of injury.  The fascial defect at the 11 mm port site was closed with a 0 Vicryl using the Endoclose device.  Pneumoperitoneum was then released as we removed the remainder of the trocars.  The port sites were closed with 4-0 Monocryl.  All of the incisions and stay suture sites were then sealed with Dermabond.  An abdominal binder was placed around the patient's abdomen.  The patient was extubated and brought to the recovery room in stable condition.  All sponge, instrument, and needle counts were correct prior to closure and at the conclusion of the case.   Findings: Type of repair - primary suture with mesh underlay  (choices - primary suture, mesh, or component)  Name of mesh - Bard VentralightST  Size of mesh - 4.5 inch round  Mesh overlap - 5 cm  Placement of mesh - beneath fascia and into peritoneal cavity  (choices - beneath fascia and into peritoneal cavity, beneath fascia but external to peritoneal cavity, between the muscle and fascia, above or external to fascia)  Estimated Blood Loss:  Minimal         Complications:  None; patient tolerated the procedure well.         Disposition: PACU - hemodynamically stable.         Condition: stable  Mary Sella. Andrey Campanile, MD, FACS General, Bariatric, & Minimally Invasive Surgery [facility], [place]

## 2013-10-24 NOTE — Anesthesia Preprocedure Evaluation (Addendum)
Anesthesia Evaluation  Patient identified by MRN, date of birth, ID band Patient awake    Reviewed: Allergy & Precautions, H&P , NPO status , Patient's Chart, lab work & pertinent test results  Airway Mallampati: III TM Distance: >3 FB Neck ROM: Full    Dental no notable dental hx.    Pulmonary sleep apnea and Continuous Positive Airway Pressure Ventilation ,  breath sounds clear to auscultation  Pulmonary exam normal       Cardiovascular negative cardio ROS  Rhythm:Regular Rate:Normal     Neuro/Psych negative neurological ROS  negative psych ROS   GI/Hepatic negative GI ROS, Neg liver ROS,   Endo/Other  diabetes, Type 2, Oral Hypoglycemic Agents  Renal/GU negative Renal ROS  negative genitourinary   Musculoskeletal negative musculoskeletal ROS (+)   Abdominal   Peds negative pediatric ROS (+)  Hematology negative hematology ROS (+)   Anesthesia Other Findings   Reproductive/Obstetrics negative OB ROS                          Anesthesia Physical Anesthesia Plan  ASA: III  Anesthesia Plan: General   Post-op Pain Management:    Induction: Intravenous  Airway Management Planned: Oral ETT  Additional Equipment:   Intra-op Plan:   Post-operative Plan: Extubation in OR  Informed Consent: I have reviewed the patients History and Physical, chart, labs and discussed the procedure including the risks, benefits and alternatives for the proposed anesthesia with the patient or authorized representative who has indicated his/her understanding and acceptance.   Dental advisory given  Plan Discussed with: CRNA  Anesthesia Plan Comments:         Anesthesia Quick Evaluation

## 2013-10-24 NOTE — H&P (View-Only) (Signed)
Patient ID: Lee Adkins, male   DOB: 02/18/1956, 58 y.o.   MRN: [id number]  Chief Complaint  Patient presents with  . Follow-up    HPI Lee Adkins is a 58 y.o. male.   HPI 58-year-old Caucasian male comes in for followup. I initially saw him back in May for an umbilical hernia. Currently he has been scheduled for laparoscopic-assisted umbilical hernia repair with mesh in mid August. He denies any changes since he was seen in May. He has continued to try to loose weight. He states he had a comprehensive physical last week and was given a good report other than encouragement for ongoing weight loss. Past Medical History  Diagnosis Date  . Diabetes mellitus without complication   . OSA on CPAP     Past Surgical History  Procedure Laterality Date  . Knee arthrocentesis  2008  . Nose surgery  1978 and 1984  . Colonoscopy      History reviewed. No pertinent family history.  Social History History  Substance Use Topics  . Smoking status: Never Smoker   . Smokeless tobacco: Not on file  . Alcohol Use: Yes    Allergies  Allergen Reactions  . Amoxapine And Related     Current Outpatient Prescriptions  Medication Sig Dispense Refill  . metFORMIN (GLUCOPHAGE) 500 MG tablet Take by mouth 2 (two) times daily with a meal.      . tamsulosin (FLOMAX) 0.4 MG CAPS capsule Take 0.4 mg by mouth.      . triamterene-hydrochlorothiazide (DYAZIDE) 37.5-25 MG per capsule Take 1 capsule by mouth daily.       No current facility-administered medications for this visit.    Review of Systems Review of Systems  Constitutional: Negative for fever, chills, appetite change and unexpected weight change.  HENT: Negative for congestion and trouble swallowing.   Eyes: Negative for visual disturbance.  Respiratory: Negative for chest tightness and shortness of breath.   Cardiovascular: Negative for chest pain and leg swelling.       No PND, no orthopnea, no DOE  Gastrointestinal:   See HPI  Genitourinary: Negative for dysuria and hematuria.  Musculoskeletal: Negative.   Skin: Negative for rash.  Neurological: Negative for seizures and speech difficulty.  Hematological: Does not bruise/bleed easily.  Psychiatric/Behavioral: Negative for behavioral problems and confusion.    Blood pressure 130/76, pulse 85, temperature 97.3 F (36.3 C), temperature source Temporal, resp. rate 16, height 5' 8" (1.727 m), weight 232 lb (105.235 kg).  Physical Exam Physical Exam  Vitals reviewed. Constitutional: He is oriented to person, place, and time. He appears well-developed and well-nourished. No distress.  obese  HENT:  Head: Normocephalic and atraumatic.  Right Ear: External ear normal.  Left Ear: External ear normal.  Eyes: Conjunctivae are normal. No scleral icterus.  Neck: Normal range of motion. Neck supple. No tracheal deviation present. No thyromegaly present.  Cardiovascular: Normal rate, normal heart sounds and intact distal pulses.   Pulmonary/Chest: Effort normal and breath sounds normal. No respiratory distress. He has no wheezes.  Abdominal:    Upper midline diastasis; +umbilical hernia with fat - not reducible. Defect about 2 cm. Soft, nt, nd, thin umbilical skin  Musculoskeletal: Normal range of motion. He exhibits no edema and no tenderness.  Lymphadenopathy:    He has no cervical adenopathy.  Neurological: He is alert and oriented to person, place, and time. He exhibits normal muscle tone.  Skin: Skin is warm and dry. No rash noted. He is   not diaphoretic. No erythema. No pallor.  Psychiatric: He has a normal mood and affect. His behavior is normal. Judgment and thought content normal.    Data Reviewed My note  Assessment    Umbilical hernia  Obesity  Diastasis recti  DM2  OSA on CPAP    Plan    He is currently scheduled for laparoscopic-assisted umbilical hernia repair with mesh on August 20. We rediscussed the operative procedure. We  rediscussed a typical postoperative course as well as the restrictions. All of his questions were asked and answered.  Note: This dictation was prepared with Dragon/digital dictation along with Smartphrase technology. Any transcriptional errors that result from this process are unintentional.  Brenyn Petrey M. Vitaly Wanat, MD, FACS General, Bariatric, & Minimally Invasive Surgery [facility] [place]         Devynn Scheff M 10/04/2013, 12:03 AM    

## 2013-10-24 NOTE — Progress Notes (Signed)
RT placed pt on auto titrate CPAP 5-20cmH2O via ffm. Sterile water was added for humidification. Pt looks comfortable and is tolerating CPAP well at this time. RT will continue to monitor as needed.  

## 2013-10-24 NOTE — Discharge Instructions (Signed)
[facility]  UMBILICAL OR INGUINAL HERNIA REPAIR: POST OP INSTRUCTIONS  Always review your discharge instruction sheet given to you by the facility where your surgery was performed. IF YOU HAVE DISABILITY OR FAMILY LEAVE FORMS, YOU MUST BRING THEM TO THE OFFICE FOR PROCESSING.   DO NOT GIVE THEM TO YOUR DOCTOR.  1. A  prescription for pain medication may be given to you upon discharge.  Take your pain medication as prescribed, if needed.  If narcotic pain medicine is not needed, then you may take acetaminophen (Tylenol) or ibuprofen (Advil) as needed. 2. Take your usually prescribed medications unless otherwise directed. 3. If you need a refill on your pain medication, please contact your pharmacy.  They will contact our office to request authorization. Prescriptions will not be filled after 5 pm or on week-ends. 4. You should follow a light diet the first 24 hours after arrival home, such as soup and crackers, etc.  Be sure to include lots of fluids daily.  Resume your normal diet the day after surgery. 5. Most patients will experience some swelling and bruising around the umbilicus or in the groin and scrotum.  Ice packs and reclining will help.  Swelling and bruising can take several days to resolve.  6. It is common to experience some constipation if taking pain medication after surgery.  Increasing fluid intake and taking a stool softener (such as Colace) will usually help or prevent this problem from occurring.  A mild laxative (Milk of Magnesia or Miralax) should be taken according to package directions if there are no bowel movements after 48 hours. 7. If your surgeon used skin glue on the incision, you may shower in 24 hours.  The glue will flake off over the next 2-3 weeks.  Any sutures or staples will be removed at the office during your follow-up visit. 8. ACTIVITIES:  You may resume regular (light) daily activities beginning the next day--such as daily self-care,  walking, climbing stairs--gradually increasing activities as tolerated.  You may have sexual intercourse when it is comfortable.  Refrain from any heavy lifting or straining until approved by your doctor. a. You may drive when you are no longer taking prescription pain medication, you can comfortably wear a seatbelt, and you can safely maneuver your car and apply brakes. b. RETURN TO WORK: 1-2 weeks with RESTRICTIONS 9. You should see your doctor in the office for a follow-up appointment approximately 2-3 weeks after your surgery.  Make sure that you call for this appointment within a day or two after you arrive home to insure a convenient appointment time. 10. OTHER INSTRUCTIONS: DO NOT LIFT, PUSH, OR PULL ANYTHING GREATER THAN 10 POUNDS FOR 6 WEEKS  11. WEAR ABDOMINAL BINDER WHILE UP AND AROUND   WHEN TO CALL YOUR DOCTOR: 1. Fever over 101.0 2. Inability to urinate 3. Nausea and/or vomiting 4. Extreme swelling or bruising 5. Continued bleeding from incision. 6. Increased pain, redness, or drainage from the incision  The clinic staff is available to answer your questions during regular business hours.  Please dont hesitate to call and ask to speak to one of the nurses for clinical concerns.  If you have a medical emergency, go to the nearest emergency room or call 911.  A surgeon from [facility] is always on call at the hospital   [address], [address], [place], [place]  [id number] ?  [address] [address], [place], [place]   [id number] [phone] ? [phone] ?  FAX [phone] Web site: [email or web address]

## 2013-10-24 NOTE — Transfer of Care (Signed)
Immediate Anesthesia Transfer of Care Note  Patient: Lee Adkins  Procedure(s) Performed: Procedure(s) (LRB): LAPAROSCOPIC ASSISTED UMBILICAL HERNIA (N/A) INSERTION OF MESH (N/A)  Patient Location: PACU  Anesthesia Type: General  Level of Consciousness: sedated, patient cooperative and responds to stimulation  Airway & Oxygen Therapy: Patient Spontanous Breathing and Patient connected to face mask oxgen  Post-op Assessment: Report given to PACU RN and Post -op Vital signs reviewed and stable  Post vital signs: Reviewed and stable  Complications: No apparent anesthesia complications

## 2013-10-24 NOTE — Anesthesia Postprocedure Evaluation (Signed)
  Anesthesia Post-op Note  Patient: Lee Adkins  Procedure(s) Performed: Procedure(s) (LRB): LAPAROSCOPIC ASSISTED UMBILICAL HERNIA (N/A) INSERTION OF MESH (N/A)  Patient Location: PACU  Anesthesia Type: General  Level of Consciousness: awake and alert   Airway and Oxygen Therapy: Patient Spontanous Breathing  Post-op Pain: mild  Post-op Assessment: Post-op Vital signs reviewed, Patient's Cardiovascular Status Stable, Respiratory Function Stable, Patent Airway and No signs of Nausea or vomiting  Last Vitals:  Filed Vitals:   10/24/13 1115  BP: 155/87  Pulse: 86  Temp: 36.4 C  Resp: 16    Post-op Vital Signs: stable   Complications: No apparent anesthesia complications

## 2013-10-25 ENCOUNTER — Encounter (HOSPITAL_COMMUNITY): Payer: Self-pay | Admitting: General Surgery

## 2013-10-25 DIAGNOSIS — Z9989 Dependence on other enabling machines and devices: Secondary | ICD-10-CM

## 2013-10-25 DIAGNOSIS — K429 Umbilical hernia without obstruction or gangrene: Secondary | ICD-10-CM | POA: Diagnosis not present

## 2013-10-25 DIAGNOSIS — G4733 Obstructive sleep apnea (adult) (pediatric): Secondary | ICD-10-CM | POA: Diagnosis present

## 2013-10-25 DIAGNOSIS — E1165 Type 2 diabetes mellitus with hyperglycemia: Secondary | ICD-10-CM

## 2013-10-25 DIAGNOSIS — IMO0001 Reserved for inherently not codable concepts without codable children: Secondary | ICD-10-CM | POA: Diagnosis present

## 2013-10-25 LAB — GLUCOSE, CAPILLARY
GLUCOSE-CAPILLARY: 211 mg/dL — AB (ref 70–99)
GLUCOSE-CAPILLARY: 272 mg/dL — AB (ref 70–99)
Glucose-Capillary: 234 mg/dL — ABNORMAL HIGH (ref 70–99)
Glucose-Capillary: 279 mg/dL — ABNORMAL HIGH (ref 70–99)

## 2013-10-25 MED ORDER — OXYCODONE-ACETAMINOPHEN 5-325 MG PO TABS: 1.0000 | ORAL_TABLET | ORAL | Status: DC | PRN

## 2013-10-25 NOTE — Discharge Summary (Signed)
Physician Discharge Summary  Veryl SpeakMichael T Shoemaker YQM:[id number] DOB: Apr 30, 1955 DOA: 10/24/2013  PCP: Johny BlamerHARRIS, WILLIAM, MD  Admit date: 10/24/2013 Discharge date: 10/25/2013  Recommendations for Outpatient Follow-up:  PCP to discuss your diabetes   Follow-up Information   Follow up with Atilano InaWILSON,Darrall Strey M, MD. Schedule an appointment as soon as possible for a visit in 4 weeks. (For wound re-check)    Specialty:  General Surgery   Contact information:   [address] [place] [address] [phone]      Discharge Diagnoses:  Active Problems:   Umbilical hernia   Type II or unspecified type diabetes mellitus without mention of complication, uncontrolled   OSA on CPAP  Surgical Procedure: Laparoscopic assisted umbilical hernia repair with mesh  Discharge Condition: good Disposition: home  Diet recommendation: diabetic  Filed Weights   10/24/13 0544  Weight: 235 lb (106.595 kg)    Hospital Course:  Patient underwent planned lap assisted umbilical hernia repair with mesh. He was kept overnight for pain control and observation. His preop fasting blood sugar was >300. He was maintained on perioperative sliding scale insulin and VTE prophylaxis. On pod 1 he was tolerating a diet, his pain was controlled, vitals stable, ambulating and was deemed stable for discharge. We discussed d/c instructions and the need for him to f/u with his PCPregarding his blood sugars.   BP 125/73  Pulse 69  Temp(Src) 97.7 F (36.5 C) (Oral)  Resp 18  Ht 5\' 8"  (1.727 m)  Wt 235 lb (106.595 kg)  BMI 35.74 kg/m2  SpO2 97% Alert, nad, sitting on edge of bed cta b/l Reg Obese, soft, bruising and swelling above/around umbilicus, no infection. Incisions c/d/i; approp TTP No edema   Discharge Instructions  Discharge Instructions   Diet Carb Modified    Complete by:  As directed      Discharge instructions    Complete by:  As directed   See [facility] discharge instructions     Increase  activity slowly    Complete by:  As directed             Medication List         metFORMIN 500 MG 24 hr tablet  Commonly known as:  GLUCOPHAGE-XR  Take 1,500 mg by mouth daily with breakfast.     OVER THE COUNTER MEDICATION  GENERIC BRAND OF EXCEDRIN HEADACHE AS NEEDED     oxyCODONE-acetaminophen 5-325 MG per tablet  Commonly known as:  PERCOCET/ROXICET  Take 1-2 tablets by mouth every 4 (four) hours as needed for moderate pain.     testosterone 50 MG/5GM (1%) Gel  Commonly known as:  ANDROGEL  Place 5 g onto the skin daily. 4 pumps     ANDROGEL PUMP TD  Place onto the skin daily. 4 pumps     triamterene-hydrochlorothiazide 37.5-25 MG per capsule  Commonly known as:  DYAZIDE  Take 1 capsule by mouth daily as needed (Swelling).           Follow-up Information   Follow up with Atilano InaWILSON,Tolbert Matheson M, MD. Schedule an appointment as soon as possible for a visit in 4 weeks. (For wound re-check)    Specialty:  General Surgery   Contact information:   [address] [place] [address] [phone]        The results of significant diagnostics from this hospitalization (including imaging, microbiology, ancillary and laboratory) are listed below for reference.    Significant Diagnostic Studies: Dg Chest 2 View  10/21/2013   CLINICAL DATA:  Preop for laparoscopic umbilical hernia repair sleep apnea.  EXAM: CHEST  2 VIEW  COMPARISON:  None.  FINDINGS: Heart, mediastinum and hila are unremarkable. The lungs are clear. No pleural effusion or pneumothorax.  Bony thorax is intact.  IMPRESSION: No active cardiopulmonary disease.   Electronically Signed   By: Amie Portland M.D.   On: 10/21/2013 13:36   Dg Cervical Spine 2 Or 3 Views  09/27/2013   CLINICAL DATA:  Neck pain.  EXAM: CERVICAL SPINE - 2-3 VIEW  COMPARISON:  None.  FINDINGS: No fracture or significant spondylolisthesis is noted. Mild anterior osteophyte formation is noted at C6-7. Hypertrophy of left-sided  posterior facet joint is seen at C5-6 secondary to degenerative joint disease. Disc spaces appear to be well maintained.  IMPRESSION: Mild degenerative changes as described above. No acute abnormality seen in the cervical spine.   Electronically Signed   By: Roque Lias M.D.   On: 09/27/2013 12:47    Microbiology: No results found for this or any previous visit (from the past 240 hour(s)).   Labs: Basic Metabolic Panel:  Recent Labs Lab 10/21/13 1105  NA 137  K 4.2  CL 97  CO2 27  GLUCOSE 355*  BUN 13  CREATININE 0.77  CALCIUM 9.6   Liver Function Tests: No results found for this basename: AST, ALT, ALKPHOS, BILITOT, PROT, ALBUMIN,  in the last 168 hours No results found for this basename: LIPASE, AMYLASE,  in the last 168 hours No results found for this basename: AMMONIA,  in the last 168 hours CBC:  Recent Labs Lab 10/21/13 1105  WBC 4.9  NEUTROABS 3.2  HGB 14.5  HCT 42.2  MCV 82.7  PLT 124*   Cardiac Enzymes: No results found for this basename: CKTOTAL, CKMB, CKMBINDEX, TROPONINI,  in the last 168 hours BNP: BNP (last 3 results) No results found for this basename: PROBNP,  in the last 8760 hours CBG:  Recent Labs Lab 10/24/13 1706 10/24/13 2014 10/24/13 2350 10/25/13 0422 10/25/13 0750  GLUCAP 345* 297* 250* 234* 279*    Active Problems:   Umbilical hernia   Type II or unspecified type diabetes mellitus without mention of complication, uncontrolled   OSA on CPAP   Time coordinating discharge: 15 minutes  Signed:  Atilano Ina, MD [facility], [place] [phone] 10/25/2013, 8:16 AM

## 2013-10-25 NOTE — Progress Notes (Signed)
Nurse reviewed discharge instructions with pt. Pt verbalized understanding of discharge instructions, follow up appointment and new medications.  No concerns at time of discharge. 

## 2013-10-30 ENCOUNTER — Encounter (HOSPITAL_COMMUNITY): Payer: Self-pay | Admitting: General Surgery

## 2015-04-20 IMAGING — CR DG CHEST 2V
2 series · 2 of 2 positions shown · non-contrast
Comparison: None.

CLINICAL DATA: Preop for laparoscopic umbilical hernia repair sleep
apnea.

EXAM:
CHEST  2 VIEW

[w chest pa]
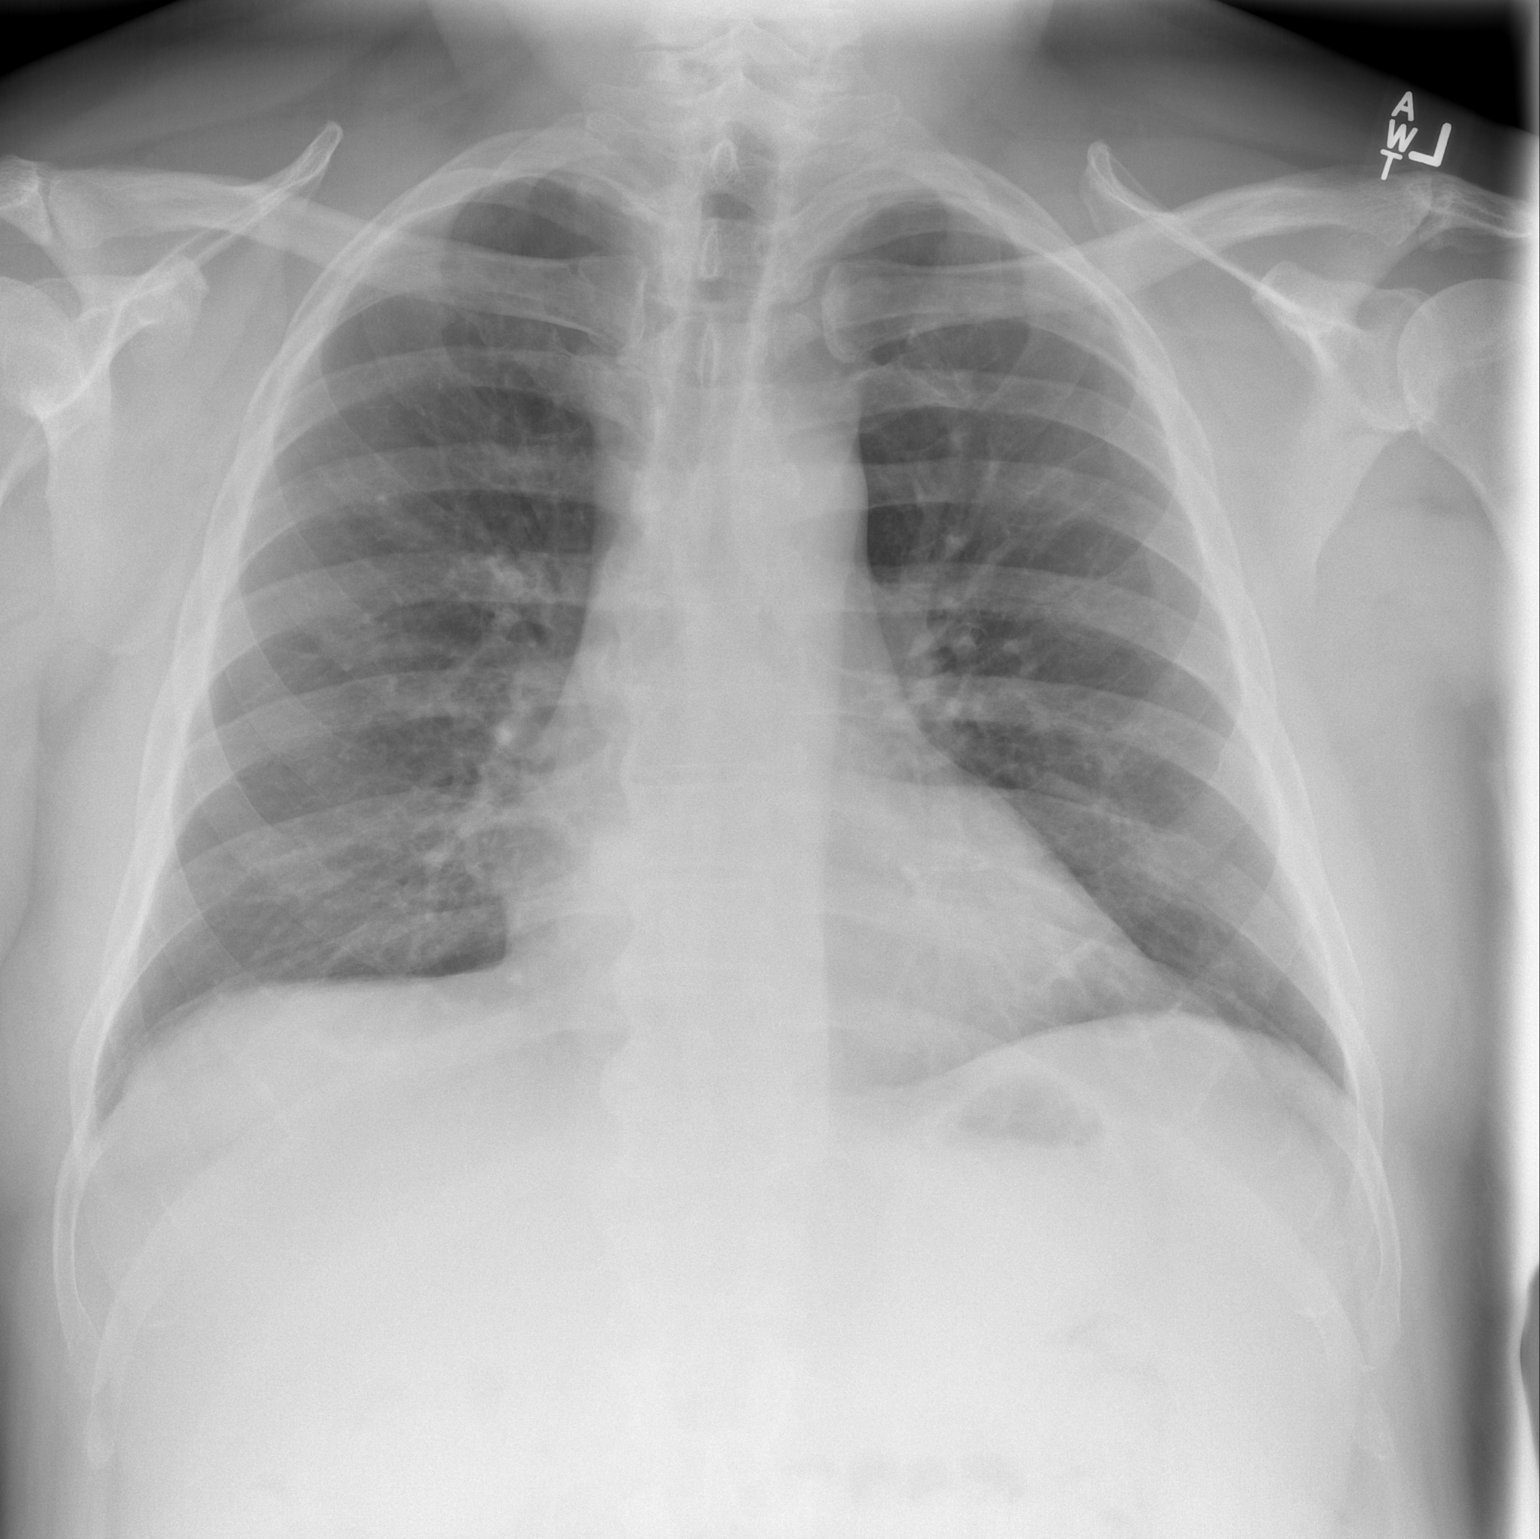

[w chest lat]
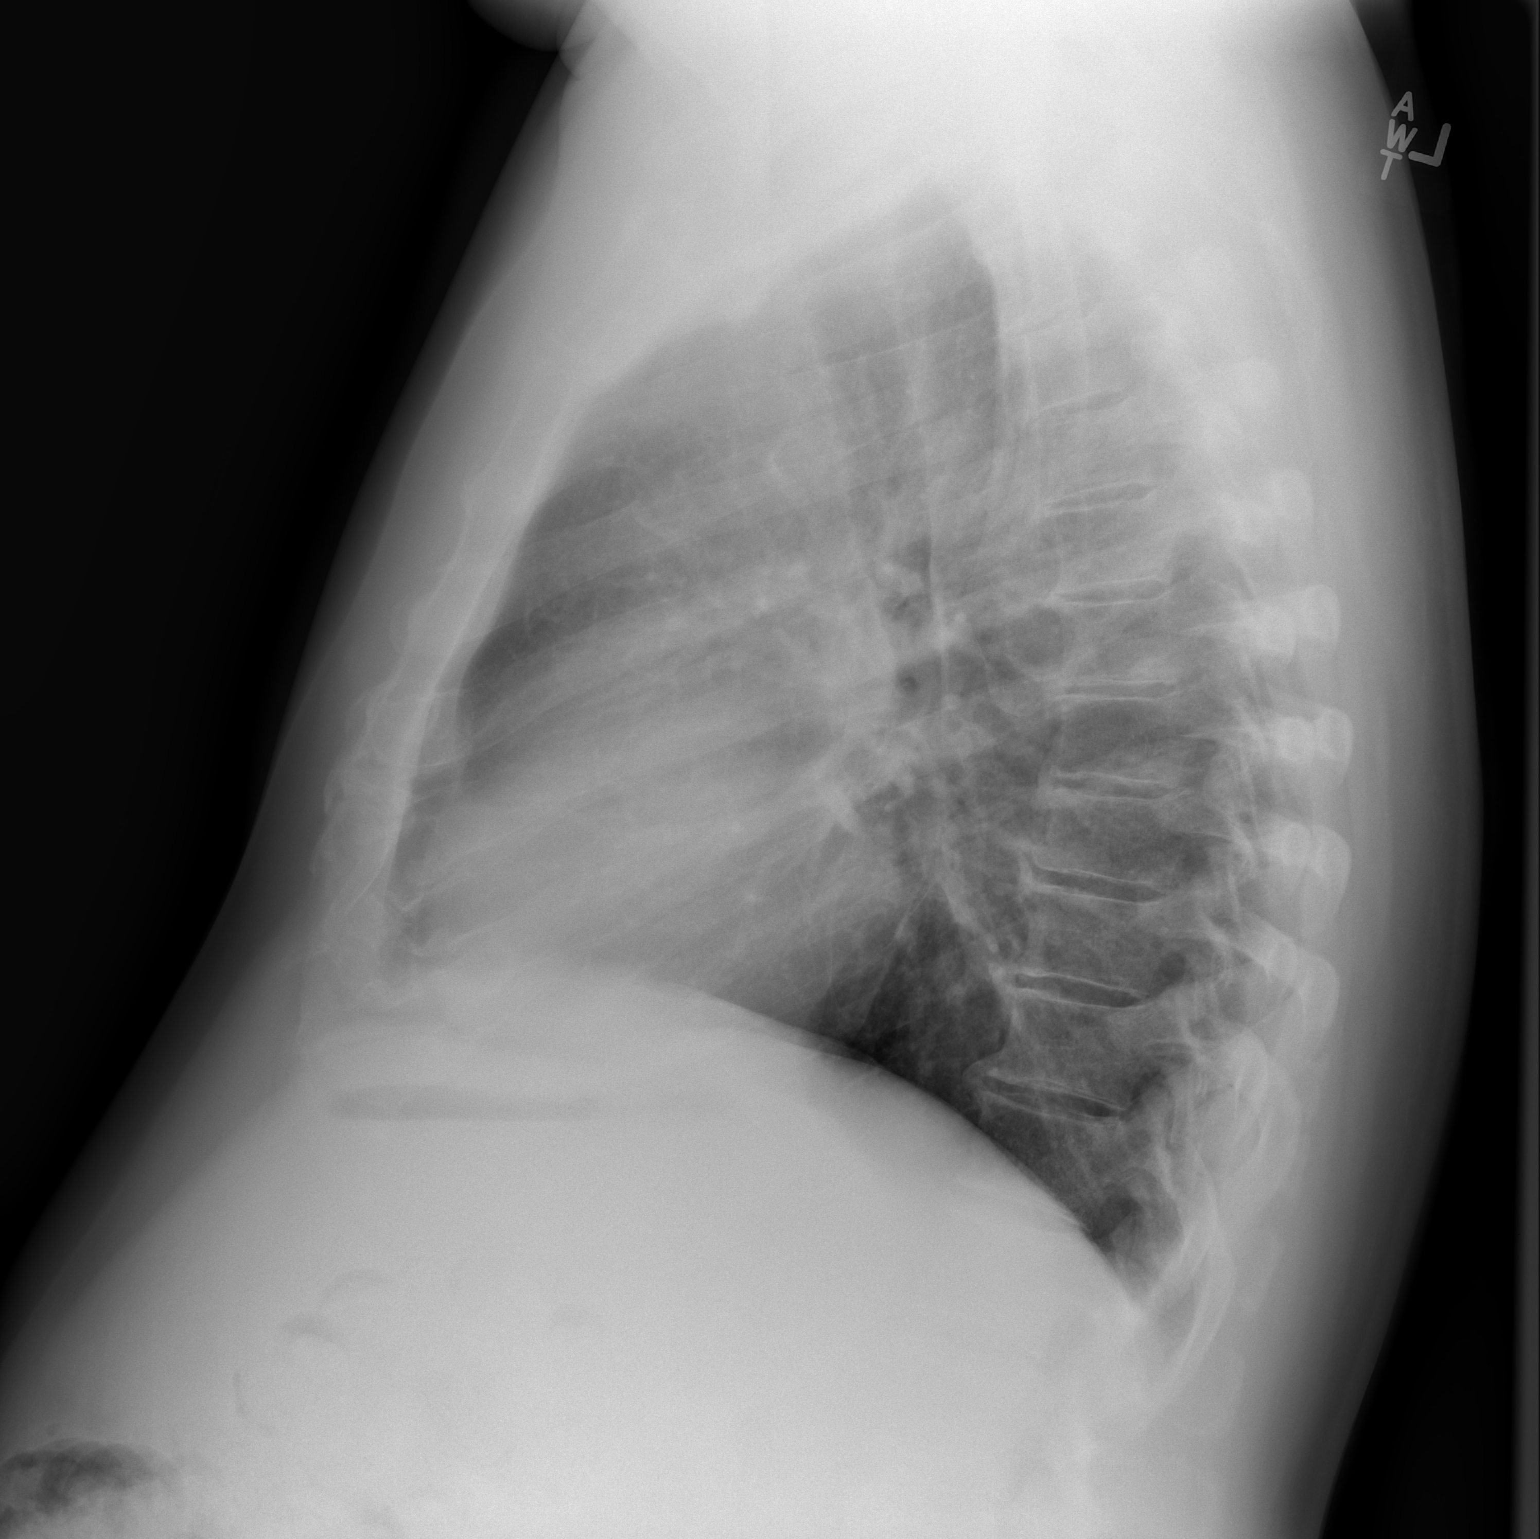

[2 of 2 positions shown; findings below may reference images not displayed]

FINDINGS: Heart, mediastinum and hila are unremarkable. The lungs are clear.
No pleural effusion or pneumothorax.

Bony thorax is intact.
IMPRESSION: No active cardiopulmonary disease.

## 2015-06-17 ENCOUNTER — Ambulatory Visit
Admission: EM | Admit: 2015-06-17 | Discharge: 2015-06-17 | Disposition: A | Discharge status: Home / Self Care | Payer: BLUE CROSS/BLUE SHIELD | Attending: Family Medicine | Admitting: Family Medicine

## 2015-06-17 ENCOUNTER — Encounter: Payer: Self-pay | Admitting: Emergency Medicine

## 2015-06-17 DIAGNOSIS — L259 Unspecified contact dermatitis, unspecified cause: Secondary | ICD-10-CM

## 2015-06-17 HISTORY — DX: Other specified abnormal findings of blood chemistry: R79.89

## 2015-06-17 MED ORDER — METHYLPREDNISOLONE SODIUM SUCC 125 MG IJ SOLR
125.0000 mg | Freq: Once | INTRAMUSCULAR | Status: AC
  Administered 2015-06-17: 125 mg via INTRAMUSCULAR

## 2015-06-17 MED ORDER — PREDNISONE 10 MG (21) PO TBPK: ORAL_TABLET | ORAL | Status: DC

## 2015-06-17 MED ORDER — MUPIROCIN 2 % EX OINT: 1.0000 "application " | TOPICAL_OINTMENT | Freq: Three times a day (TID) | CUTANEOUS | Status: DC

## 2015-06-17 MED ORDER — RANITIDINE HCL 150 MG PO CAPS: 150.0000 mg | ORAL_CAPSULE | Freq: Two times a day (BID) | ORAL | Status: DC | PRN

## 2015-06-17 MED ORDER — LORATADINE 10 MG PO TABS: 10.0000 mg | ORAL_TABLET | Freq: Every day | ORAL | Status: DC | PRN

## 2015-06-17 MED ORDER — PREDNISONE 10 MG (21) PO TBPK
ORAL_TABLET | ORAL | Status: DC
Start: 2015-06-17 — End: 2015-06-17

## 2015-06-17 NOTE — ED Notes (Signed)
Pt has been cleaning up [place] "shop" [place]. Possible exposure to leaves, chemicals, not certain what he touched.  Rash started Thursday or Friday, and has been taking benadryl and calamine lotion, cortisone spray. Generalized rash on bilateral arms, legs and torso.

## 2015-06-17 NOTE — ED Provider Notes (Signed)
CSN: [id number]     Arrival date & time 06/17/15  1012    Nurses notes were reviewed. History   First MD Initiated Contact with Patient 06/17/15 1037      Patient reports rash about 7 days duration. He states rash started about a day after he had to clean out a shed work does not stuff growing in the shed and loss of chemicals and other debris in the shed since then he started having rash arms is now spread to his legs back and chest. He states his itching all over and scratching my crazy. Benadryl does not help back with the Benadryl hasn't done it made him sleepy he is so miserable last night states he could not sleep. Despite taking the Benadryl.   He has a history of borderline diabetes and this had to take medication. Last summer he had surgery states that with the surgery he had time to work on his diet since his changes.he's been off this medication since the screws were then remaining level and normal. He has appointment with his PCP next month he was going to discuss with him whether he needs to go back on any type of medication. Patient does not smoke.  No significant family medical history pertinent today's visit.  Chief Complaint  Patient presents with  . Rash   (Consider location/radiation/quality/duration/timing/severity/associated sxs/prior Treatment) Patient is a 60 y.o. male presenting with rash. The history is provided by the patient. No language interpreter was used.  Rash Location:  Torso, leg and shoulder/arm Shoulder/arm rash location:  R arm, R forearm, L forearm and L arm Torso rash location:  L chest, R chest, abd LUQ and abd LLQ Leg rash location:  L lower leg and R lower leg Quality: blistering, itchiness, redness and scaling   Severity:  Moderate Duration:  7 days Timing:  Constant Progression:  Worsening Chronicity:  New Context: chemical exposure   Context comment:  States about 7 days ago he had clean out a shed had lots chemicals and other stuff growing  and in the shed and other things in the shed Relieved by:  Antihistamines and anti-itch cream Ineffective treatments:  Anti-fungal cream and antihistamines Associated symptoms: no fatigue, no fever, no headaches, no hoarse voice, no shortness of breath, no throat swelling and not wheezing     Past Medical History  Diagnosis Date  . OSA on CPAP   . Diabetes mellitus without complication (HCC)     ORAL MEDICATION - NO INSULIN  . History of kidney stones   . 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 Low testosterone    Past Surgical History  Procedure Laterality Date  . Knee arthrocentesis  2008  . Nose surgery  1978 and 1984  . Colonoscopy    . Lithotripsy    . Umbilical hernia repair N/A 10/24/2013    Procedure: LAPAROSCOPIC ASSISTED UMBILICAL HERNIA;  Surgeon: Atilano Ina, MD;  Location: [facility];  Service: General;  Laterality: N/A;  . Insertion of mesh N/A 10/24/2013    Procedure: INSERTION OF MESH;  Surgeon: Atilano Ina, MD;  Location: [facility];  Service: General;  Laterality: N/A;   History reviewed. No pertinent family history. Social History  Substance Use Topics  . Smoking status: Never Smoker   . Smokeless tobacco: Former Neurosurgeon  . Alcohol Use: Yes     Comment: RARELY DOES ALCOHOL    Review of Systems  Constitutional: Negative for  fever and fatigue.  HENT: Negative for hoarse voice.   Respiratory: Negative for shortness of breath and wheezing.   Skin: Positive for rash.  Neurological: Negative for headaches.  All other systems reviewed and are negative.   Allergies  Amoxicillin  Home Medications   Prior to Admission medications   Medication Sig Start Date End Date Taking? Authorizing Provider  OVER THE COUNTER MEDICATION GENERIC BRAND OF EXCEDRIN HEADACHE AS NEEDED   Yes Historical Provider, MD  loratadine (CLARITIN) 10 MG tablet Take 1 tablet (10 mg total) by mouth daily as needed for allergies. 06/17/15   Hassan Rowan, MD  metFORMIN  (GLUCOPHAGE-XR) 500 MG 24 hr tablet Take 1,500 mg by mouth daily with breakfast.    Historical Provider, MD  mupirocin ointment (BACTROBAN) 2 % Apply 1 application topically 3 (three) times daily. 06/17/15   Hassan Rowan, MD  oxyCODONE-acetaminophen (PERCOCET/ROXICET) 5-325 MG per tablet Take 1-2 tablets by mouth every 4 (four) hours as needed for moderate pain. 10/25/13   Gaynelle Adu, MD  predniSONE (STERAPRED UNI-PAK 21 TAB) 10 MG (21) TBPK tablet Sig 6 tablet day 1, 5 tablets day 2, 4 tablets day 3,,3tablets day 4, 2 tablets day 5, 1 tablet day 6 take all tablets orally 06/17/15   Hassan Rowan, MD  ranitidine (ZANTAC) 150 MG capsule Take 1 capsule (150 mg total) by mouth 2 (two) times daily as needed for heartburn. 06/17/15   Hassan Rowan, MD  Testosterone (ANDROGEL PUMP TD) Place onto the skin daily. 4 pumps    Historical Provider, MD  testosterone (ANDROGEL) 50 MG/5GM (1%) GEL Place 5 g onto the skin daily. 4 pumps    Historical Provider, MD  triamterene-hydrochlorothiazide (DYAZIDE) 37.5-25 MG per capsule Take 1 capsule by mouth daily as needed (Swelling).     Historical Provider, MD   Meds Ordered and Administered this Visit   Medications  methylPREDNISolone sodium succinate (SOLU-MEDROL) 125 mg/2 mL injection 125 mg (125 mg Intramuscular Given 06/17/15 1057)    BP 184/77 mmHg  Pulse 69  Temp(Src) 97.9 F (36.6 C) (Oral)  Resp 18  Ht  (1.727 m)  Wt 222 lb (100.699 kg)  BMI 33.76 kg/m2  SpO2 100% No data found.   Physical Exam  Constitutional: He is oriented to person, place, and time. He appears well-developed and well-nourished.  HENT:  Head: Normocephalic and atraumatic.  Eyes: Conjunctivae are normal. Pupils are equal, round, and reactive to light.  Musculoskeletal: Normal range of motion.  Neurological: He is alert and oriented to person, place, and time.  Skin: Rash noted.     He's got a red rash on his chest abdomen arms and legs. Most the rash looks like is a contact  dermatitis he does have a few on the hands and the legs icy lesions with excoriations that he's been scratching.  Psychiatric: He has a normal mood and affect.  Vitals reviewed.   ED Course  Procedures (including critical care time)  Labs Review Labs Reviewed - No data to display  Imaging Review No results found.   Visual Acuity Review  Right Eye Distance:   Left Eye Distance:   Bilateral Distance:    Right Eye Near:   Left Eye Near:    Bilateral Near:         MDM   1. Contact dermatitis and eczema    Discussed patient not clear what caused this rash but I think he should be treated aggressively. He is a borderline diabetic. He has  been on diabetic medication before he checks his blood sugar somewhat regular basis. Will recommend he check his blood sugar record basis now. He did request to go ahead with a Medrol injection even though he was warned that he could make his diabetes worse. We'll give him 125 mg Solu-Medrol today, and placement 12 days Solu-Medrol Dosepak. Bactroban ointment tonly to use for those lesions that appear secondarily infected by them remaining open, developing yellow honey coated exudate develop or other signs of infections. Bedroom makes him sleepy stop the Benadryl and use either Claritin 10 mg and Zantac 150 twice a day when necessary for itching. Work note given for today and instructed him to follow-up with his PCP one week if not better. Also strongly recommend he checks his blood sugar on a regular basis to make sure his diabetes stays under control.   Note: This dictation was prepared with Dragon dictation along with smaller phrase technology. Any transcriptional errors that result from this process are unintentional.      Hassan RowanEugene Geraldina Parrott, MD 06/17/15 1112

## 2015-06-17 NOTE — Discharge Instructions (Signed)
Contact Dermatitis °Dermatitis is redness, soreness, and swelling (inflammation) of the skin. Contact dermatitis is a reaction to certain substances that touch the skin. You either touched something that irritated your skin, or you have allergies to something you touched.  °HOME CARE  °Skin Care °· Moisturize your skin as needed. °· Apply cool compresses to the affected areas.   °· Try taking a bath with:   °· Epsom salts. Follow the instructions on the package. You can get these at a pharmacy or grocery store.   °· Baking soda. Pour a small amount into the bath as told by your doctor.   °· Colloidal oatmeal. Follow the instructions on the package. You can get this at a pharmacy or grocery store.   °· Try applying baking soda paste to your skin. Stir water into baking soda until it looks like paste. °· Do not scratch your skin.   °· Bathe less often. °· Bathe in lukewarm water. Avoid using hot water.   °Medicines °· Take or apply over-the-counter and prescription medicines only as told by your doctor.   °· If you were prescribed an antibiotic medicine, take or apply your antibiotic as told by your doctor. Do not stop taking the antibiotic even if your condition starts to get better. °General Instructions °· Keep all follow-up visits as told by your doctor. This is important.   °· Avoid the substance that caused your reaction. If you do not know what caused it, keep a journal to try to track what caused it. Write down:   °· What you eat.   °· What cosmetic products you use.   °· What you drink.   °· What you wear in the affected area. This includes jewelry.   °· If you were given a bandage (dressing), take care of it as told by your doctor. This includes when to change and remove it.   °GET HELP IF:  °· You do not get better with treatment.   °· Your condition gets worse.   °· You have signs of infection such as: °¨ Swelling. °¨ Tenderness. °¨ Redness. °¨ Soreness. °¨ Warmth.   °· You have a fever.   °· You have new  symptoms.   °GET HELP RIGHT AWAY IF:  °· You have a very bad headache. °· You have neck pain. °· Your neck is stiff.    °· You throw up (vomit).   °· You feel very sleepy.   °· You see red streaks coming from the affected area.   °· Your bone or joint underneath the affected area becomes painful after the skin has healed.   °· The affected area turns darker.   °· You have trouble breathing.   °  °This information is not intended to replace advice given to you by your health care provider. Make sure you discuss any questions you have with your health care provider. °  °Document Released: 12/19/2008 Document Revised: 11/12/2014 Document Reviewed: 07/09/2014 °Elsevier Interactive Patient Education ©2016 [employer] ° °Rash °A rash is a change in the color or feel of your skin. There are many different types of rashes. You may have other problems along with your rash. °HOME CARE °· Avoid the thing that caused your rash. °· Do not scratch your rash. °· You may take cools baths to help stop itching. °· Only take medicines as told by your doctor. °· Keep all doctor visits as told. °GET HELP RIGHT AWAY IF:  °· Your pain, puffiness (swelling), or redness gets worse. °· You have a fever. °· You have new or severe problems. °· You have body aches, watery poop (diarrhea), or you throw   up (vomit). °· Your rash is not better after 3 days. °MAKE SURE YOU:  °· Understand these instructions. °· Will watch your condition. °· Will get help right away if you are not doing well or get worse. °  °This information is not intended to replace advice given to you by your health care provider. Make sure you discuss any questions you have with your health care provider. °  °Document Released: 08/10/2007 Document Revised: 05/16/2011 Document Reviewed: 07/09/2014 °Elsevier Interactive Patient Education ©2016 [employer] ° °

## 2015-07-08 ENCOUNTER — Ambulatory Visit
Admission: EM | Admit: 2015-07-08 | Discharge: 2015-07-08 | Disposition: A | Discharge status: Home / Self Care | Payer: BLUE CROSS/BLUE SHIELD | Attending: Family Medicine | Admitting: Family Medicine

## 2015-07-08 ENCOUNTER — Encounter: Payer: Self-pay | Admitting: *Deleted

## 2015-07-08 DIAGNOSIS — L03818 Cellulitis of other sites: Secondary | ICD-10-CM | POA: Diagnosis not present

## 2015-07-08 DIAGNOSIS — Z8719 Personal history of other diseases of the digestive system: Secondary | ICD-10-CM | POA: Diagnosis not present

## 2015-07-08 DIAGNOSIS — Z9889 Other specified postprocedural states: Secondary | ICD-10-CM

## 2015-07-08 MED ORDER — SULFAMETHOXAZOLE-TRIMETHOPRIM 800-160 MG PO TABS: 1.0000 | ORAL_TABLET | Freq: Two times a day (BID) | ORAL | Status: DC

## 2015-07-08 MED ORDER — MUPIROCIN 2 % EX OINT: 1.0000 "application " | TOPICAL_OINTMENT | Freq: Three times a day (TID) | CUTANEOUS | Status: DC

## 2015-07-08 NOTE — ED Provider Notes (Signed)
CSN: [id number]     Arrival date & time 07/08/15  [id number] History   First MD Initiated Contact with Patient 07/08/15 0920       Nurses notes were reviewed. Chief Complaint  Patient presents with  . Umbilical Hernia  Patient's here because of possible umbilical hernia. He states he had an umbilical hernia repaired about 3 years ago. Also about the time he was a type II diabetic and elevated A1c's. He does some things should change his eating habits blood sugars went down but he doesn't state whether he is kept his weight off but apparently has not followed up with his PCP in over 2 years. He states this month he does have an appointment for follow-up with his PCP. Reports not having trouble with the umbilical hernia until recently. He also has a lesion on the site where does cause some pruritus and he does admit scratch Corrie DandyMary he denies any insect bites or any other injury to the umbilicus and the abdominal site area.   He also reports having a bad rash which we treated recently prednisone and he has been scratching the area because of the irritation inflammation of the rash as well. He does not smoke there is no significant family history pertinent to today's visit he does have history low testosterone history kidney stones and arthritis. He's had knee surgery and no surgery before in the past.   He is allergic to amoxicillin as well. (Consider location/radiation/quality/duration/timing/severity/associated sxs/prior Treatment) Patient is a 60 y.o. male presenting with abscess. The history is provided by the patient. No language interpreter was used.  Abscess Abscess quality: induration, itching and redness   Progression:  Worsening Chronicity:  New Worsened by:  Nothing tried Associated symptoms: no anorexia   Risk factors: no family hx of MRSA, no hx of MRSA and no prior abscess     Past Medical History  Diagnosis Date  . OSA on CPAP   . Diabetes mellitus without complication (HCC)     ORAL  MEDICATION - NO INSULIN  . History of kidney stones   . 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 Low testosterone    Past Surgical History  Procedure Laterality Date  . Knee arthrocentesis  2008  . Nose surgery  1978 and 1984  . Colonoscopy    . Lithotripsy    . Umbilical hernia repair N/A 10/24/2013    Procedure: LAPAROSCOPIC ASSISTED UMBILICAL HERNIA;  Surgeon: Atilano InaEric M Wilson, MD;  Location: [facility];  Service: General;  Laterality: N/A;  . Insertion of mesh N/A 10/24/2013    Procedure: INSERTION OF MESH;  Surgeon: Atilano InaEric M Wilson, MD;  Location: [facility];  Service: General;  Laterality: N/A;   History reviewed. No pertinent family history. Social History  Substance Use Topics  . Smoking status: Never Smoker   . Smokeless tobacco: Former NeurosurgeonUser  . Alcohol Use: Yes     Comment: RARELY DOES ALCOHOL    Review of Systems  Gastrointestinal: Negative for anorexia.    Allergies  Amoxicillin  Home Medications   Prior to Admission medications   Medication Sig Start Date End Date Taking? Authorizing Provider  loratadine (CLARITIN) 10 MG tablet Take 1 tablet (10 mg total) by mouth daily as needed for allergies. 06/17/15  Yes Hassan RowanEugene Tiffancy Moger, MD  OVER THE COUNTER MEDICATION GENERIC BRAND OF EXCEDRIN HEADACHE AS NEEDED   Yes Historical Provider, MD  metFORMIN (GLUCOPHAGE-XR) 500 MG 24 hr tablet Take  1,500 mg by mouth daily with breakfast.    Historical Provider, MD  mupirocin ointment (BACTROBAN) 2 % Apply 1 application topically 3 (three) times daily. 06/17/15   Hassan Rowan, MD  oxyCODONE-acetaminophen (PERCOCET/ROXICET) 5-325 MG per tablet Take 1-2 tablets by mouth every 4 (four) hours as needed for moderate pain. 10/25/13   Gaynelle Adu, MD  predniSONE (STERAPRED UNI-PAK 21 TAB) 10 MG (21) TBPK tablet Sig 6 tablet day 1, 5 tablets day 2, 4 tablets day 3,,3tablets day 4, 2 tablets day 5, 1 tablet day 6 take all tablets orally 06/17/15   Hassan Rowan, MD  ranitidine (ZANTAC) 150 MG  capsule Take 1 capsule (150 mg total) by mouth 2 (two) times daily as needed for heartburn. 06/17/15   Hassan Rowan, MD  Testosterone (ANDROGEL PUMP TD) Place onto the skin daily. 4 pumps    Historical Provider, MD  testosterone (ANDROGEL) 50 MG/5GM (1%) GEL Place 5 g onto the skin daily. 4 pumps    Historical Provider, MD  triamterene-hydrochlorothiazide (DYAZIDE) 37.5-25 MG per capsule Take 1 capsule by mouth daily as needed (Swelling).     Historical Provider, MD   Meds Ordered and Administered this Visit  Medications - No data to display  BP 184/91 mmHg  Pulse 73  Temp(Src) 98 F (36.7 C) (Oral)  Resp 16  Ht  (1.727 m)  Wt 225 lb (102.059 kg)  BMI 34.22 kg/m2  SpO2 100% No data found.   Physical Exam  Constitutional: He is oriented to person, place, and time. He appears well-developed and well-nourished.  HENT:  Head: Normocephalic.  Eyes: Pupils are equal, round, and reactive to light.  Neck: Normal range of motion.  Cardiovascular: Normal rate and regular rhythm.   Pulmonary/Chest: Effort normal and breath sounds normal.  Abdominal: Soft. Bowel sounds are normal. There is no hepatosplenomegaly. There is tenderness in the periumbilical area. There is no CVA tenderness. No hernia. Hernia confirmed negative in the ventral area.    Patient has umbilical hernia site. There is some redness around the lumbar hernia this appears be some scar tissue as well. Difficult to palpate this area due to the tenderness and constipation. He has appears to be a abraded area skin almost 6 insect bite. He reports that he does scratch this area that after the umbilical surgery he developed a low area this raised that all that itches him and he scratches it.  Musculoskeletal: Normal range of motion. He exhibits edema. He exhibits no tenderness.  Neurological: He is alert and oriented to person, place, and time.  Skin: Skin is warm and dry. Rash noted.  Psychiatric: He has a normal mood and  affect. His behavior is normal.  Vitals reviewed.   ED Course  Procedures (including critical care time)  Labs Review Labs Reviewed - No data to display  Imaging Review No results found.   Visual Acuity Review  Right Eye Distance:   Left Eye Distance:   Bilateral Distance:    Right Eye Near:   Left Eye Near:    Bilateral Near:         MDM   1. Cellulitis of other specified site   2. Hx of umbilical hernia repair    Patient declined work note. We'll place on Septra DS 1 tablet twice daily for 10 days back pain ointment to apply to the area 3 times a day for up to 10 days if needed. Stress to him that if things not better in 5 days  that we need to have him come back to be seen and reevaluated. Patient has not had a doctor's appointment in over a year and is probably been about 2 years since he stopped his medication on his own for his diabetes. Stressed the need to have follow-up with that. Also point patient that if he's not better in 5 days to return to see me what might need to order a CT scan of the abdomen at that time to recheck on the umbilical hernia site.  Note: This dictation was prepared with Dragon dictation along with smaller phrase technology. Any transcriptional errors that result from this process are unintentional.   Hassan Rowan, MD 07/08/15 1005

## 2015-07-08 NOTE — Discharge Instructions (Signed)
Cellulitis Cellulitis is an infection of the skin and the tissue under the skin. The infected area is usually red and tender. This happens most often in the arms and lower legs. HOME CARE   Take your antibiotic medicine as told. Finish the medicine even if you start to feel better.  Keep the infected arm or leg raised (elevated).  Put a warm cloth on the area up to 4 times per day.  Only take medicines as told by your doctor.  Keep all doctor visits as told. GET HELP IF:  You see red streaks on the skin coming from the infected area.  Your red area gets bigger or turns a dark color.  Your bone or joint under the infected area is painful after the skin heals.  Your infection comes back in the same area or different area.  You have a puffy (swollen) bump in the infected area.  You have new symptoms.  You have a fever. GET HELP RIGHT AWAY IF:   You feel very sleepy.  You throw up (vomit) or have watery poop (diarrhea).  You feel sick and have muscle aches and pains.   This information is not intended to replace advice given to you by your health care provider. Make sure you discuss any questions you have with your health care provider.   Document Released: 08/10/2007 Document Revised: 11/12/2014 Document Reviewed: 05/09/2011 Elsevier Interactive Patient Education 2016 [employer].  MRSA FAQs What is MRSA? Staphylococcus aureus (pronounced staff-ill-oh-KOK-us AW-ree-us), or "Staph" is a very common germ that about 1 out of every 3 people have on their skin or in their nose. This germ does not cause any problems for most people who have it on their skin. But sometimes it can cause serious infections such as skin or wound infections, pneumonia, or infections of the blood.  Antibiotics are given to kill Staph germs when they cause infections. Some Staph are resistant, meaning they cannot be killed by some antibiotics. "Methicillin-resistant Staphylococcus aureus" or "MRSA" is a  type of Staph that is resistant to some of the antibiotics that are often used to treat Staph infections. Who is most likely to get an MRSA infection? In the hospital, people who are more likely to get an MRSA infection are people who:  have other health conditions making them sick  have been in the hospital or a nursing home  have been treated with antibiotics. People who are healthy and who have not been in the hospital or a nursing home can also get MRSA infections. These infections usually involve the skin. More information about this type of MRSA infection, known as "community-associated MRSA" infection, is available from the Centers for Disease Control and Prevention (CDC). ReportNation.uy How do I get an MRSA infection? People who have MRSA germs on their skin or who are infected with MRSA may be able to spread the germ to other people. MRSA can be passed on to bed linens, bed rails, bathroom fixtures, and medical equipment. It can spread to other people on contaminated equipment and on the hands of doctors, nurses, other healthcare providers and visitors. Can MRSA infections be treated? Yes, there are antibiotics that can kill MRSA germs. Some patients with MRSA abscesses may need surgery to drain the infection. Your healthcare provider will determine which treatments are best for you. What are some of the things that hospitals are doing to prevent MRSA infections? To prevent MRSA infections, doctors, nurses and other healthcare providers:  Clean their hands with  soap and water or an alcohol-based hand rub before and after caring for every patient. °· Carefully clean hospital rooms and medical equipment. °· Use Contact Precautions when caring for patients with MRSA. Contact Precautions mean: °¨ Whenever possible, patients with MRSA will have a single room or will share a room only with someone else who also has MRSA. °¨ Healthcare providers will put on gloves and wear a gown over  their clothing while taking care of patients with MRSA. °¨ Visitors may also be asked to wear a gown and gloves. °¨ When leaving the room, hospital providers and visitors remove their gown and gloves and clean their hands. °¨ Patients on Contact Precautions are asked to stay in their hospital rooms as much as possible. They should not go to common areas, such as the gift shop or cafeteria. They may go to other areas of the hospital for treatments and tests. °· May test some patients to see if they have MRSA on their skin. This test involves rubbing a cotton-tipped swab in the patient's nostrils or on the skin. °What can I do to help prevent MRSA infections? °In the hospital °· Make sure that all doctors, nurses, and other healthcare providers clean their hands with soap and water or an alcohol-based hand rub before and after caring for you. °If you do not see your providers clean their hands, please ask them to do so. °When you go home °· If you have wounds or an intravascular device (such as a catheter or dialysis port) make sure that you know how to take care of them. °Can my friends and family get MRSA when they visit me? °The chance of getting MRSA while visiting a person who has MRSA is very low. To decrease the chance of getting MRSA your family and friends should: °· Clean their hands before they enter your room and when they leave. °· Ask a healthcare provider if they need to wear protective gowns and gloves when they visit you. °What do I need to do when I go home from the hospital? °To prevent another MRSA infection and to prevent spreading MRSA to others: °· Keep taking any antibiotics prescribed by your doctor. Don't take half-doses or stop before you complete your prescribed course. °· Clean your hands often, especially before and after changing your wound dressing or bandage. °· People who live with you should clean their hands often as well. °· Keep any wounds clean and change bandages as instructed  until healed. °· Avoid sharing personal items such as towels or razors. °· Wash and dry your clothes and bed linens in the warmest temperatures recommended on the labels. °· Tell your healthcare providers that you have MRSA. This includes home health nurses and aides, therapists, and personnel in doctors' offices. °· Your doctor may have more instructions for you. °If you have questions, please ask your doctor or nurse. °Developed and co-sponsored by The Society for Healthcare Epidemiology of America (SHEA); Infectious Diseases Society of America (IDSA); American Hospital Association; Association for Professionals in Infection Control and Epidemiology (APIC); Centers for Disease Control and Prevention (CDC); and The Joint Commission. °  °This information is not intended to replace advice given to you by your health care provider. Make sure you discuss any questions you have with your health care provider. °  °Document Released: 02/26/2013 Document Reviewed: 05/07/2014 °Elsevier Interactive Patient Education ©2016 Elsevier Inc. ° °

## 2015-07-08 NOTE — ED Notes (Signed)
Umbilical pain, redness, and edema. Previous umbilical hernia surgery 2 yrs ago.

## 2015-07-14 ENCOUNTER — Ambulatory Visit
Admission: EM | Admit: 2015-07-14 | Discharge: 2015-07-14 | Disposition: A | Discharge status: Home / Self Care | Payer: BLUE CROSS/BLUE SHIELD | Attending: Family Medicine | Admitting: Family Medicine

## 2015-07-14 ENCOUNTER — Encounter: Payer: Self-pay | Admitting: Emergency Medicine

## 2015-07-14 DIAGNOSIS — L02216 Cutaneous abscess of umbilicus: Secondary | ICD-10-CM | POA: Diagnosis not present

## 2015-07-14 NOTE — ED Provider Notes (Signed)
CSN: [id number]     Arrival date & time 07/14/15  0845 History   First MD Initiated Contact with Patient 07/14/15 [phone]    Nurses notes were reviewed.  Chief Complaint  Patient presents with  . Wound Check  Patient's here for follow-up of wound/umbilical abscess. He reports over the last few days a significant amount of blood and drainage coming from the abscess. The key thing though is is drainage present. One day he's got pus out of the abscess site on the umbilicus another day may be some bleeding. Some days is a combination of the 2. Reports though the pain and the discomfort that he had over the area has gone away now and he feels much better. He is just concerned about the drainage.   (Consider location/radiation/quality/duration/timing/severity/associated sxs/prior Treatment) Patient is a 60 y.o. male presenting with wound check. The history is provided by the patient. No language interpreter was used.  Wound Check This is a new problem. The current episode started more than 1 week ago. The problem occurs constantly. The problem has been gradually improving. Pertinent negatives include no chest pain, no abdominal pain, no headaches and no shortness of breath. Nothing aggravates the symptoms. Nothing relieves the symptoms. Treatments tried: He is on Bactroban and Septra DS.    Past Medical History  Diagnosis Date  . OSA on CPAP   . Diabetes mellitus without complication (HCC)     ORAL MEDICATION - NO INSULIN  . History of kidney stones   . 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 Low testosterone    Past Surgical History  Procedure Laterality Date  . Knee arthrocentesis  2008  . Nose surgery  1978 and 1984  . Colonoscopy    . Lithotripsy    . Umbilical hernia repair N/A 10/24/2013    Procedure: LAPAROSCOPIC ASSISTED UMBILICAL HERNIA;  Surgeon: Atilano Ina, MD;  Location: [facility];  Service: General;  Laterality: N/A;  . Insertion of mesh N/A 10/24/2013   Procedure: INSERTION OF MESH;  Surgeon: Atilano Ina, MD;  Location: [facility];  Service: General;  Laterality: N/A;   History reviewed. No pertinent family history. Social History  Substance Use Topics  . Smoking status: Never Smoker   . Smokeless tobacco: Former Neurosurgeon  . Alcohol Use: Yes     Comment: RARELY DOES ALCOHOL    Review of Systems  Respiratory: Negative for shortness of breath.   Cardiovascular: Negative for chest pain.  Gastrointestinal: Negative for abdominal pain.  Neurological: Negative for headaches.  All other systems reviewed and are negative.   Allergies  Amoxicillin  Home Medications   Prior to Admission medications   Medication Sig Start Date End Date Taking? Authorizing Provider  loratadine (CLARITIN) 10 MG tablet Take 1 tablet (10 mg total) by mouth daily as needed for allergies. 06/17/15   Hassan Rowan, MD  metFORMIN (GLUCOPHAGE-XR) 500 MG 24 hr tablet Take 1,500 mg by mouth daily with breakfast.    Historical Provider, MD  mupirocin ointment (BACTROBAN) 2 % Apply 1 application topically 3 (three) times daily. 06/17/15   Hassan Rowan, MD  mupirocin ointment (BACTROBAN) 2 % Apply 1 application topically 3 (three) times daily. 07/08/15   Hassan Rowan, MD  OVER THE COUNTER MEDICATION GENERIC BRAND OF EXCEDRIN HEADACHE AS NEEDED    Historical Provider, MD  oxyCODONE-acetaminophen (PERCOCET/ROXICET) 5-325 MG per tablet Take 1-2 tablets by mouth every 4 (four) hours as needed for moderate  pain. 10/25/13   Gaynelle AduEric Wilson, MD  predniSONE (STERAPRED UNI-PAK 21 TAB) 10 MG (21) TBPK tablet Sig 6 tablet day 1, 5 tablets day 2, 4 tablets day 3,,3tablets day 4, 2 tablets day 5, 1 tablet day 6 take all tablets orally 06/17/15   Hassan RowanEugene Anay Walter, MD  ranitidine (ZANTAC) 150 MG capsule Take 1 capsule (150 mg total) by mouth 2 (two) times daily as needed for heartburn. 06/17/15   Hassan RowanEugene Jekhi Bolin, MD  sulfamethoxazole-trimethoprim (BACTRIM DS,SEPTRA DS) 800-160 MG tablet Take 1 tablet by mouth 2 (two)  times daily. 07/08/15   Hassan RowanEugene Kimiko Common, MD  Testosterone (ANDROGEL PUMP TD) Place onto the skin daily. 4 pumps    Historical Provider, MD  testosterone (ANDROGEL) 50 MG/5GM (1%) GEL Place 5 g onto the skin daily. 4 pumps    Historical Provider, MD  triamterene-hydrochlorothiazide (DYAZIDE) 37.5-25 MG per capsule Take 1 capsule by mouth daily as needed (Swelling).     Historical Provider, MD   Meds Ordered and Administered this Visit  Medications - No data to display  BP 190/80 mmHg  Pulse 75  Temp(Src) 98.1 F (36.7 C) (Tympanic)  Resp 16  Ht 5\' 8"  (1.727 m)  Wt 225 lb (102.059 kg)  BMI 34.22 kg/m2  SpO2 100% No data found.   Physical Exam  Constitutional: He is oriented to person, place, and time. He appears well-developed and well-nourished.  HENT:  Head: Normocephalic and atraumatic.  Eyes: Conjunctivae are normal. Pupils are equal, round, and reactive to light.  Abdominal: Soft. Normal appearance. There is no splenomegaly or hepatomegaly. There is tenderness. There is no CVA tenderness. No hernia. Hernia confirmed negative in the ventral area.    There is a little redness around umbilicus and the some drainage neck reproduced from the umbilicus itself but the overall redness was present before has improved the mouth tenderness that he has over the site is also improved.  Neurological: He is alert and oriented to person, place, and time.  Skin: Rash noted. There is erythema.  Vitals reviewed.   ED Course  Procedures (including critical care time)  Labs Review Labs Reviewed - No data to display  Imaging Review No results found.   Visual Acuity Review  Right Eye Distance:   Left Eye Distance:   Bilateral Distance:    Right Eye Near:   Left Eye Near:    Bilateral Near:         MDM   1. Abscess, umbilical    Overall improvement in healing of the umbilicus abscess. Patient does not need any type of I&D procedure or week 5. Continue using the anabiotic ointment  and Bactroban ointment as well. He can cover the site if this friction such as when he is at work bending over to come forth or any sleeping if he does want to stain his sheets otherwise he can leave it uncovered. Continue with the Bactroban ointment 3 times a day and finish out the Septra antibiotics.  Note: This dictation was prepared with Dragon dictation along with smaller phrase technology. Any transcriptional errors that result from this process are unintentional.  Hassan RowanEugene Laquincy Eastridge, MD 07/14/15 1016

## 2015-07-14 NOTE — Discharge Instructions (Signed)
Abscess °An abscess (boil or furuncle) is an infected area on or under the skin. This area is filled with yellowish-white fluid (pus) and other material (debris). °HOME CARE  °· Only take medicines as told by your doctor. °· If you were given antibiotic medicine, take it as directed. Finish the medicine even if you start to feel better. °· If gauze is used, follow your doctor's directions for changing the gauze. °· To avoid spreading the infection: °¨ Keep your abscess covered with a bandage. °¨ Wash your hands well. °¨ Do not share personal care items, towels, or whirlpools with others. °¨ Avoid skin contact with others. °· Keep your skin and clothes clean around the abscess. °· Keep all doctor visits as told. °GET HELP RIGHT AWAY IF:  °· You have more pain, puffiness (swelling), or redness in the wound site. °· You have more fluid or blood coming from the wound site. °· You have muscle aches, chills, or you feel sick. °· You have a fever. °MAKE SURE YOU:  °· Understand these instructions. °· Will watch your condition. °· Will get help right away if you are not doing well or get worse. °  °This information is not intended to replace advice given to you by your health care provider. Make sure you discuss any questions you have with your health care provider. °  °Document Released: 08/10/2007 Document Revised: 08/23/2011 Document Reviewed: 05/07/2011 °Elsevier Interactive Patient Education ©2016 Elsevier Inc. ° °

## 2015-07-14 NOTE — ED Notes (Signed)
Patient here for follow-up with infection in his belly button.  Patient reports ongoing drainage from his belly button and some bleeding.  Patient denies fevers.

## 2017-01-21 ENCOUNTER — Ambulatory Visit
Admission: EM | Admit: 2017-01-21 | Discharge: 2017-01-21 | Disposition: A | Discharge status: Home / Self Care | Payer: BLUE CROSS/BLUE SHIELD | Attending: Emergency Medicine | Admitting: Emergency Medicine

## 2017-01-21 ENCOUNTER — Encounter: Payer: Self-pay | Admitting: Emergency Medicine

## 2017-01-21 ENCOUNTER — Other Ambulatory Visit: Payer: Self-pay

## 2017-01-21 DIAGNOSIS — L03319 Cellulitis of trunk, unspecified: Secondary | ICD-10-CM

## 2017-01-21 DIAGNOSIS — L02219 Cutaneous abscess of trunk, unspecified: Secondary | ICD-10-CM

## 2017-01-21 HISTORY — DX: Essential (primary) hypertension: I10

## 2017-01-21 MED ORDER — DOXYCYCLINE HYCLATE 100 MG PO CAPS: 100.0000 mg | ORAL_CAPSULE | Freq: Two times a day (BID) | ORAL | 0 refills | Status: DC

## 2017-01-21 MED ORDER — MUPIROCIN 2 % EX OINT: 1.0000 "application " | TOPICAL_OINTMENT | Freq: Three times a day (TID) | CUTANEOUS | 0 refills | Status: DC

## 2017-01-21 NOTE — ED Triage Notes (Signed)
Patient c/o redness, tender and swelling in his belly button for a week.  Patient reports some discharge belly button.

## 2017-01-21 NOTE — ED Provider Notes (Signed)
[facility]    CSN: [id number] Arrival date & time: 01/21/17  1520     History   Chief Complaint Chief Complaint  Patient presents with  . Cellulitis    belly button    HPI Lee Adkins is a 61 y.o. male.   HPI  This a 61 year old male who presents with a recurrent cellulitis abscess of his umbilicus. He had undergone a laparoscopic assisted umbilical hernia repair on 10/24/2013. He was seen in this office on 07/08/2015 with a cellulitis the patient was told that he could expect the internal sutures to work their way to the surface which has happened. His noticed a recurrence of his previous cellulitis recently. He has been able to squeeze some purulent material but he is now has a surrounding erythema. A small pointing abscess on the 10:00 position of the umbilicus. Is afebrile.       Past Medical History:  Diagnosis Date  . Arthritis    BONE SPURS LOWER BACK AND ARTHRITIS IN NECK  . Diabetes mellitus without complication (HCC)    ORAL MEDICATION - NO INSULIN  . History of kidney stones   . Hypertension   . Low testosterone   . OSA on CPAP     Patient Active Problem List   Diagnosis Date Noted  . Type II or unspecified type diabetes mellitus without mention of complication, uncontrolled 10/25/2013  . OSA on CPAP 10/25/2013  . Umbilical hernia 10/24/2013    Past Surgical History:  Procedure Laterality Date  . COLONOSCOPY    . HERNIA REPAIR    . INSERTION OF MESH N/A 10/24/2013   Performed by Gaynelle AduWilson, Eric, MD [facility]  . KNEE ARTHROCENTESIS  2008  . LAPAROSCOPIC ASSISTED UMBILICAL HERNIA N/A 10/24/2013   [facility] Gay[facility]elle [facility]Wilson[facility] Eric[facility]  . LITHOTRIPSY    . NOSE SURGERY  1978 and 1984       Home Medications    Prior to Admission medications   Medication Sig Start Date End Date Taking? Authorizing Provider  loratadine (CLARITIN) 10 MG tablet Take 1 tablet (10 mg total) by mouth daily as needed for allergies. 06/17/15  Yes Hassan RowanWade,  Eugene, MD  metFORMIN (GLUCOPHAGE-XR) 500 MG 24 hr tablet Take 1,500 mg by mouth daily with breakfast.   Yes [provider]  triamterene-hydrochlorothiazide (MAXZIDE-25) 37.5-25 MG tablet Take 1 tablet daily by mouth.   Yes [provider]  doxycycline (VIBRAMYCIN) 100 MG capsule Take 1 capsule (100 mg total) 2 (two) times daily by mouth. 01/21/17   Lutricia Feiloemer, William P, PA-C  mupirocin ointment (BACTROBAN) 2 % Apply 1 application 3 (three) times daily topically. 01/21/17   Lutricia Feiloemer, William P, PA-C  OVER THE COUNTER MEDICATION GENERIC BRAND OF EXCEDRIN HEADACHE AS NEEDED    [provider]    Family History History reviewed. No pertinent family history.  Social History Social History   Tobacco Use  . Smoking status: Never Smoker  . Smokeless tobacco: Former Engineer, waterUser  Substance Use Topics  . Alcohol use: Yes    Comment: RARELY DOES ALCOHOL  . Drug use: No     Allergies   Amoxicillin   Review of Systems Review of Systems  Constitutional: Negative for activity change, chills, fatigue and fever.  Skin: Positive for color change and wound.  All other systems reviewed and are negative.    Physical Exam Triage Vital Signs ED Triage Vitals  Enc Vitals Group     BP 01/21/17 1544 Marland Kitchen(!)  176/81     Pulse Rate 01/21/17 1544 88     Resp 01/21/17 1544 16     Temp 01/21/17 1544 98.8 F (37.1 C)     Temp Source 01/21/17 1544 Oral     SpO2 01/21/17 1544 98 %     Weight 01/21/17 1540 225 lb (102.1 kg)     Height 01/21/17 1540 5\' 8"  (1.727 m)     Head Circumference --      Peak Flow --      Pain Score 01/21/17 1540 0     Pain Loc --      Pain Edu? --      Excl. in GC? --    No data found.  Updated Vital Signs BP (!) 176/81 (BP Location: Right Arm)   Pulse 88   Temp 98.8 F (37.1 C) (Oral)   Resp 16   Ht 5\' 8"  (1.727 m)   Wt 225 lb (102.1 kg)   SpO2 98%   BMI 34.21 kg/m   Visual Acuity Right Eye Distance:   Left Eye Distance:   Bilateral  Distance:    Right Eye Near:   Left Eye Near:    Bilateral Near:     Physical Exam  Constitutional: He appears well-developed and well-nourished. No distress.  HENT:  Head: Normocephalic.  Eyes: Pupils are equal, round, and reactive to light. Right eye exhibits no discharge. Left eye exhibits no discharge.  Neck: Normal range of motion.  Abdominal: Soft. There is tenderness.  Examination of the umbilicus shows a small pointing abscess at the 11:00 position. There is surrounding cellulitis that is blanchable. I refer to photograph for details.  Skin: He is not diaphoretic.  Nursing note and vitals reviewed.        UC Treatments / Results  Labs (all labs ordered are listed, but only abnormal results are displayed) Labs Reviewed - No data to display  EKG  EKG Interpretation None       Radiology No results found.  Procedures Incision and Drainage Date/Time: 01/21/2017 4:36 PM Performed by: Lutricia Feil, PA-C Authorized by: Domenick Gong, MD   Consent:    Consent obtained:  Verbal   Consent given by:  Patient   Risks discussed:  Incomplete drainage and infection   Alternatives discussed:  Alternative treatment Location:    Type:  Abscess   Size:  1 cm   Location:  Trunk   Trunk location:  Abdomen Pre-procedure details:    Skin preparation:  Chloraprep Anesthesia (see MAR for exact dosages):    Anesthesia method:  None Procedure type:    Complexity:  Simple Procedure details:    Needle aspiration: yes     Needle size:  18 G   Drainage:  Purulent and serosanguinous   Drainage amount:  Scant   Wound treatment:  Wound left open   Packing materials:  None Post-procedure details:    Patient tolerance of procedure:  Tolerated well, no immediate complications   (including critical care time)  Medications Ordered in UC Medications - No data to display   Initial Impression / Assessment and Plan / UC Course  I have reviewed the triage vital signs  and the nursing notes.  Pertinent labs & imaging results that were available during my care of the patient were reviewed by me and considered in my medical decision making (see chart for details).     Plan: 1. Test/x-ray results and diagnosis reviewed with patient 2. rx as per orders;  risks, benefits, potential side effects reviewed with patient 3. Recommend supportive treatment with warm compresses 3 times daily followed by thorough drying and application of Bactroban ointment. We'll place the patient on doxycycline oral for 7 days. Recommend follow-up with his primary care physician or return to our clinic in 48 hours. 4. F/u prn if symptoms worsen or don't improve   Final Clinical Impressions(s) / UC Diagnoses   Final diagnoses:  Cellulitis and abscess of trunk    ED Discharge Orders        Ordered    mupirocin ointment (BACTROBAN) 2 %  3 times daily     01/21/17 1627    doxycycline (VIBRAMYCIN) 100 MG capsule  2 times daily     01/21/17 1627       Controlled Substance Prescriptions [place] Controlled Substance Registry consulted? Not Applicable   Lutricia FeilRoemer, William P, PA-C 01/21/17 1641

## 2017-01-21 NOTE — Discharge Instructions (Signed)
Use warm compresses on the umbilicus 2 times a day and apply Bactroban ointment following thorough drying. If you develops high fever or if more redness develops or more pus is expressed go to the emergency room or return to our clinic.

## 2018-11-21 ENCOUNTER — Encounter: Payer: Self-pay | Admitting: Family Medicine

## 2018-12-31 ENCOUNTER — Ambulatory Visit: Payer: BLUE CROSS/BLUE SHIELD | Admitting: Gastroenterology

## 2019-01-02 ENCOUNTER — Encounter: Admit: 2019-01-02 | Discharge: 2019-01-03 | Payer: PRIVATE HEALTH INSURANCE

## 2019-01-02 DIAGNOSIS — R197 Diarrhea, unspecified: Principal | ICD-10-CM

## 2019-01-02 DIAGNOSIS — Z1211 Encounter for screening for malignant neoplasm of colon: Principal | ICD-10-CM

## 2019-01-14 DIAGNOSIS — G4733 Obstructive sleep apnea (adult) (pediatric): Principal | ICD-10-CM

## 2019-01-28 ENCOUNTER — Encounter
Admit: 2019-01-28 | Discharge: 2019-01-29 | Payer: PRIVATE HEALTH INSURANCE | Attending: Physician Assistant | Primary: Physician Assistant

## 2019-04-10 ENCOUNTER — Encounter
Admit: 2019-04-10 | Discharge: 2019-04-11 | Payer: PRIVATE HEALTH INSURANCE | Attending: Adult Health | Primary: Adult Health

## 2019-04-10 DIAGNOSIS — E785 Hyperlipidemia, unspecified: Principal | ICD-10-CM

## 2019-04-10 DIAGNOSIS — I10 Essential (primary) hypertension: Principal | ICD-10-CM

## 2019-04-11 MED ORDER — FELODIPINE ER 10 MG TABLET,EXTENDED RELEASE 24 HR
ORAL_TABLET | Freq: Every day | ORAL | 11 refills | 30 days | Status: CP
Start: 2019-04-11 — End: 2020-04-10

## 2019-04-22 ENCOUNTER — Ambulatory Visit: Admit: 2019-04-22 | Discharge: 2019-04-23 | Payer: PRIVATE HEALTH INSURANCE

## 2019-04-25 ENCOUNTER — Encounter
Admit: 2019-04-25 | Discharge: 2019-04-26 | Payer: PRIVATE HEALTH INSURANCE | Attending: Adult Health | Primary: Adult Health

## 2019-04-25 DIAGNOSIS — E785 Hyperlipidemia, unspecified: Principal | ICD-10-CM

## 2019-04-25 DIAGNOSIS — I10 Essential (primary) hypertension: Principal | ICD-10-CM

## 2019-04-25 DIAGNOSIS — Z7189 Other specified counseling: Principal | ICD-10-CM

## 2019-05-03 MED ORDER — CARVEDILOL 6.25 MG TABLET
ORAL_TABLET | Freq: Two times a day (BID) | ORAL | 6 refills | 30 days | Status: CP
Start: 2019-05-03 — End: ?

## 2019-10-04 ENCOUNTER — Ambulatory Visit: Admit: 2019-10-04 | Discharge: 2019-10-05 | Payer: PRIVATE HEALTH INSURANCE

## 2019-10-04 DIAGNOSIS — M25562 Pain in left knee: Principal | ICD-10-CM

## 2019-10-04 DIAGNOSIS — R350 Frequency of micturition: Principal | ICD-10-CM

## 2019-10-04 MED ORDER — NITROFURANTOIN MONOHYDRATE/MACROCRYSTALS 100 MG CAPSULE
ORAL_CAPSULE | Freq: Two times a day (BID) | ORAL | 0 refills | 7.00000 days | Status: CP
Start: 2019-10-04 — End: 2019-10-11

## 2019-10-04 MED ORDER — NAPROXEN 500 MG TABLET
ORAL_TABLET | Freq: Two times a day (BID) | ORAL | 0 refills | 10.00000 days | Status: CP
Start: 2019-10-04 — End: 2019-10-14

## 2019-12-03 ENCOUNTER — Encounter
Admit: 2019-12-03 | Discharge: 2019-12-04 | Payer: PRIVATE HEALTH INSURANCE | Attending: Emergency Medicine | Primary: Emergency Medicine

## 2019-12-03 DIAGNOSIS — S025XXA Fracture of tooth (traumatic), initial encounter for closed fracture: Principal | ICD-10-CM

## 2019-12-03 DIAGNOSIS — R220 Localized swelling, mass and lump, head: Principal | ICD-10-CM

## 2019-12-03 MED ORDER — CLINDAMYCIN HCL 300 MG CAPSULE
ORAL_CAPSULE | Freq: Three times a day (TID) | ORAL | 0 refills | 7.00000 days | Status: CP
Start: 2019-12-03 — End: 2019-12-10

## 2020-02-20 ENCOUNTER — Emergency Department: Payer: BLUE CROSS/BLUE SHIELD

## 2020-02-20 ENCOUNTER — Encounter: Payer: Self-pay | Admitting: Emergency Medicine

## 2020-02-20 ENCOUNTER — Inpatient Hospital Stay
Admission: EM | Admit: 2020-02-20 | Discharge: 2020-02-26 | DRG: 177 | Disposition: A | Discharge status: Home Health | Payer: BLUE CROSS/BLUE SHIELD | Attending: Internal Medicine | Admitting: Internal Medicine

## 2020-02-20 ENCOUNTER — Other Ambulatory Visit: Payer: Self-pay

## 2020-02-20 DIAGNOSIS — T502X5A Adverse effect of carbonic-anhydrase inhibitors, benzothiadiazides and other diuretics, initial encounter: Secondary | ICD-10-CM | POA: Diagnosis present

## 2020-02-20 DIAGNOSIS — J1282 Pneumonia due to coronavirus disease 2019: Secondary | ICD-10-CM | POA: Diagnosis present

## 2020-02-20 DIAGNOSIS — J9600 Acute respiratory failure, unspecified whether with hypoxia or hypercapnia: Secondary | ICD-10-CM | POA: Diagnosis not present

## 2020-02-20 DIAGNOSIS — E1165 Type 2 diabetes mellitus with hyperglycemia: Secondary | ICD-10-CM | POA: Diagnosis present

## 2020-02-20 DIAGNOSIS — U071 COVID-19: Principal | ICD-10-CM | POA: Diagnosis present

## 2020-02-20 DIAGNOSIS — E876 Hypokalemia: Secondary | ICD-10-CM | POA: Diagnosis present

## 2020-02-20 DIAGNOSIS — R0902 Hypoxemia: Secondary | ICD-10-CM

## 2020-02-20 DIAGNOSIS — J9601 Acute respiratory failure with hypoxia: Secondary | ICD-10-CM | POA: Diagnosis present

## 2020-02-20 DIAGNOSIS — D72819 Decreased white blood cell count, unspecified: Secondary | ICD-10-CM | POA: Diagnosis present

## 2020-02-20 DIAGNOSIS — I10 Essential (primary) hypertension: Secondary | ICD-10-CM | POA: Diagnosis present

## 2020-02-20 DIAGNOSIS — Z7984 Long term (current) use of oral hypoglycemic drugs: Secondary | ICD-10-CM

## 2020-02-20 DIAGNOSIS — E871 Hypo-osmolality and hyponatremia: Secondary | ICD-10-CM | POA: Diagnosis present

## 2020-02-20 DIAGNOSIS — Z20822 Contact with and (suspected) exposure to covid-19: Secondary | ICD-10-CM

## 2020-02-20 LAB — BASIC METABOLIC PANEL
Anion gap: 14 (ref 5–15)
BUN: 17 mg/dL (ref 8–23)
CO2: 26 mmol/L (ref 22–32)
Calcium: 8.5 mg/dL — ABNORMAL LOW (ref 8.9–10.3)
Chloride: 88 mmol/L — ABNORMAL LOW (ref 98–111)
Creatinine, Ser: 0.97 mg/dL (ref 0.61–1.24)
GFR, Estimated: >60 mL/min (ref >60)
Glucose, Bld: 318 mg/dL — ABNORMAL HIGH (ref 70–99)
Potassium: 3.2 mmol/L — ABNORMAL LOW (ref 3.5–5.1)
Sodium: 128 mmol/L — ABNORMAL LOW (ref 135–145)

## 2020-02-20 LAB — CBG MONITORING, ED
Glucose-Capillary: 263 mg/dL — ABNORMAL HIGH (ref 70–99)
Glucose-Capillary: 371 mg/dL — ABNORMAL HIGH (ref 70–99)
Glucose-Capillary: 406 mg/dL — ABNORMAL HIGH (ref 70–99)
Glucose-Capillary: 402 mg/dL — ABNORMAL HIGH (ref 70–99)
Glucose-Capillary: 386 mg/dL — ABNORMAL HIGH (ref 70–99)

## 2020-02-20 LAB — CBC
HCT: 42.8 % (ref 39.0–52.0)
Hemoglobin: 14.7 g/dL (ref 13.0–17.0)
MCH: 27.9 pg (ref 26.0–34.0)
MCHC: 34.3 g/dL (ref 30.0–36.0)
MCV: 81.4 fL (ref 80.0–100.0)
Platelets: 204 10*3/uL (ref 150–400)
RBC: 5.26 MIL/uL (ref 4.22–5.81)
RDW: 12.9 % (ref 11.5–15.5)
WBC: 5.4 10*3/uL (ref 4.0–10.5)
nRBC: 0 % (ref 0.0–0.2)

## 2020-02-20 LAB — RESP PANEL BY RT-PCR (FLU A&B, COVID) ARPGX2
Influenza A by PCR: NEGATIVE
Influenza B by PCR: NEGATIVE
SARS Coronavirus 2 by RT PCR: POSITIVE — AB

## 2020-02-20 LAB — HIV ANTIBODY (ROUTINE TESTING W REFLEX): HIV Screen 4th Generation wRfx: NONREACTIVE

## 2020-02-20 LAB — PROCALCITONIN: Procalcitonin: <0.1 ng/mL

## 2020-02-20 MED ORDER — SODIUM CHLORIDE 0.9% FLUSH: 3.0000 mL | INTRAVENOUS | Status: DC | PRN

## 2020-02-20 MED ORDER — GUAIFENESIN-DM 100-10 MG/5ML PO SYRP
10.0000 mL | ORAL_SOLUTION | ORAL | Status: DC | PRN
  Administered 2020-02-21 – 2020-02-23 (×2): 10 mL via ORAL
  Filled 2020-02-20 (×2): qty 10

## 2020-02-20 MED ORDER — DEXAMETHASONE SODIUM PHOSPHATE 10 MG/ML IJ SOLN
6.0000 mg | INTRAMUSCULAR | Status: DC
  Administered 2020-02-20 – 2020-02-22 (×3): 6 mg via INTRAVENOUS
  Filled 2020-02-20 (×3): qty 1

## 2020-02-20 MED ORDER — POTASSIUM CHLORIDE CRYS ER 20 MEQ PO TBCR
40.0000 meq | EXTENDED_RELEASE_TABLET | Freq: Once | ORAL | Status: AC
  Administered 2020-02-20: 40 meq via ORAL
  Filled 2020-02-20: qty 2

## 2020-02-20 MED ORDER — ENOXAPARIN SODIUM 60 MG/0.6ML ~~LOC~~ SOLN
50.0000 mg | SUBCUTANEOUS | Status: DC
  Administered 2020-02-20 – 2020-02-26 (×6): 50 mg via SUBCUTANEOUS
  Filled 2020-02-20 (×7): qty 0.6

## 2020-02-20 MED ORDER — INSULIN ASPART 100 UNIT/ML ~~LOC~~ SOLN
10.0000 [IU] | Freq: Once | SUBCUTANEOUS | Status: AC
  Administered 2020-02-20: 10 [IU] via SUBCUTANEOUS
  Filled 2020-02-20: qty 1

## 2020-02-20 MED ORDER — SODIUM CHLORIDE 0.9 % IV SOLN
100.0000 mg | Freq: Every day | INTRAVENOUS | Status: AC
  Administered 2020-02-21 – 2020-02-24 (×4): 100 mg via INTRAVENOUS
  Filled 2020-02-20 (×4): qty 20
  Filled 2020-02-20: qty 100

## 2020-02-20 MED ORDER — SODIUM CHLORIDE 0.9 % IV SOLN: 250.0000 mL | INTRAVENOUS | Status: DC | PRN

## 2020-02-20 MED ORDER — INSULIN ASPART 100 UNIT/ML ~~LOC~~ SOLN
0.0000 [IU] | Freq: Three times a day (TID) | SUBCUTANEOUS | Status: DC
  Administered 2020-02-20: 15 [IU] via SUBCUTANEOUS
  Administered 2020-02-21: 11 [IU] via SUBCUTANEOUS
  Administered 2020-02-21: 8 [IU] via SUBCUTANEOUS
  Administered 2020-02-21: 11 [IU] via SUBCUTANEOUS
  Administered 2020-02-22: 15 [IU] via SUBCUTANEOUS
  Administered 2020-02-22 (×2): 11 [IU] via SUBCUTANEOUS
  Administered 2020-02-23: 17:00:00 5 [IU] via SUBCUTANEOUS
  Administered 2020-02-24: 13:00:00 3 [IU] via SUBCUTANEOUS
  Administered 2020-02-24 – 2020-02-25 (×4): 5 [IU] via SUBCUTANEOUS
  Administered 2020-02-25: 2 [IU] via SUBCUTANEOUS
  Filled 2020-02-20 (×15): qty 1

## 2020-02-20 MED ORDER — SODIUM CHLORIDE 0.9 % IV BOLUS
500.0000 mL | Freq: Once | INTRAVENOUS | Status: AC
  Administered 2020-02-20: 500 mL via INTRAVENOUS

## 2020-02-20 MED ORDER — ALBUTEROL SULFATE HFA 108 (90 BASE) MCG/ACT IN AERS
2.0000 | INHALATION_SPRAY | Freq: Four times a day (QID) | RESPIRATORY_TRACT | Status: DC
  Administered 2020-02-20 – 2020-02-26 (×22): 2 via RESPIRATORY_TRACT
  Filled 2020-02-20: qty 6.7

## 2020-02-20 MED ORDER — ASCORBIC ACID 500 MG PO TABS
500.0000 mg | ORAL_TABLET | Freq: Every day | ORAL | Status: DC
  Administered 2020-02-20 – 2020-02-26 (×7): 500 mg via ORAL
  Filled 2020-02-20 (×7): qty 1

## 2020-02-20 MED ORDER — SODIUM CHLORIDE 0.9 % IV SOLN
200.0000 mg | Freq: Once | INTRAVENOUS | Status: AC
  Administered 2020-02-20: 200 mg via INTRAVENOUS
  Filled 2020-02-20: qty 40

## 2020-02-20 MED ORDER — SODIUM CHLORIDE 0.9% FLUSH
3.0000 mL | Freq: Two times a day (BID) | INTRAVENOUS | Status: DC
  Administered 2020-02-20 – 2020-02-25 (×9): 3 mL via INTRAVENOUS

## 2020-02-20 MED ORDER — SODIUM CHLORIDE 0.9 % IV SOLN
500.0000 mg | Freq: Once | INTRAVENOUS | Status: AC
  Administered 2020-02-20: 500 mg via INTRAVENOUS
  Filled 2020-02-20: qty 500

## 2020-02-20 MED ORDER — CARVEDILOL 6.25 MG PO TABS
6.2500 mg | ORAL_TABLET | Freq: Two times a day (BID) | ORAL | Status: DC
  Administered 2020-02-20 – 2020-02-26 (×13): 6.25 mg via ORAL
  Filled 2020-02-20 (×9): qty 1
  Filled 2020-02-20: qty 2
  Filled 2020-02-20 (×3): qty 1

## 2020-02-20 MED ORDER — ONDANSETRON HCL 4 MG/2ML IJ SOLN: 4.0000 mg | Freq: Four times a day (QID) | INTRAMUSCULAR | Status: DC | PRN

## 2020-02-20 MED ORDER — POTASSIUM CHLORIDE 20 MEQ PO PACK
40.0000 meq | PACK | Freq: Every day | ORAL | Status: DC
  Administered 2020-02-20 – 2020-02-24 (×4): 40 meq via ORAL
  Filled 2020-02-20 (×5): qty 2

## 2020-02-20 MED ORDER — ACETAMINOPHEN 325 MG PO TABS
650.0000 mg | ORAL_TABLET | Freq: Four times a day (QID) | ORAL | Status: DC | PRN
  Administered 2020-02-22: 650 mg via ORAL
  Filled 2020-02-20: qty 2

## 2020-02-20 MED ORDER — INSULIN DETEMIR 100 UNIT/ML ~~LOC~~ SOLN
0.1500 [IU]/kg | Freq: Two times a day (BID) | SUBCUTANEOUS | Status: DC
  Administered 2020-02-20 – 2020-02-21 (×2): 15 [IU] via SUBCUTANEOUS
  Filled 2020-02-20 (×4): qty 0.15

## 2020-02-20 MED ORDER — LOSARTAN POTASSIUM 50 MG PO TABS
100.0000 mg | ORAL_TABLET | Freq: Every day | ORAL | Status: DC
  Administered 2020-02-20 – 2020-02-26 (×6): 100 mg via ORAL
  Filled 2020-02-20 (×7): qty 2

## 2020-02-20 MED ORDER — ZINC SULFATE 220 (50 ZN) MG PO CAPS
220.0000 mg | ORAL_CAPSULE | Freq: Every day | ORAL | Status: DC
  Administered 2020-02-20 – 2020-02-24 (×4): 220 mg via ORAL
  Filled 2020-02-20 (×5): qty 1

## 2020-02-20 MED ORDER — ONDANSETRON HCL 4 MG PO TABS: 4.0000 mg | ORAL_TABLET | Freq: Four times a day (QID) | ORAL | Status: DC | PRN

## 2020-02-20 MED ORDER — LORATADINE 10 MG PO TABS
10.0000 mg | ORAL_TABLET | Freq: Every day | ORAL | Status: DC | PRN
  Filled 2020-02-20: qty 1

## 2020-02-20 MED ORDER — FELODIPINE ER 10 MG PO TB24
10.0000 mg | ORAL_TABLET | Freq: Every day | ORAL | Status: DC
  Administered 2020-02-20 – 2020-02-25 (×6): 10 mg via ORAL
  Filled 2020-02-20 (×9): qty 1

## 2020-02-20 NOTE — H&P (Signed)
History and Physical    MATTHEU BRODERSEN OVF:[id number] DOB: 1955/07/03 DOA: 02/20/2020  PCP: Abram Sander, MD   Patient coming from: Home  I have personally briefly reviewed patient's old medical records in [facility]  Chief Complaint: Shortness of breath  HPI: DEQUANE STRAHAN is a 64 y.o. male with medical history significant for hypertension and diabetes mellitus who presents to the emergency room for evaluation of worsening shortness of breath.  Patient has had symptoms since 12 /04/21 which he describes as low-grade fever, cough, anorexia and myalgias.  He was tested for the COVID-19 virus on 02/14/20 and his PCR test came back positive on 02/16/20.  His symptoms have progressively worsened and he presented to the ER today via EMS for evaluation of worsening shortness of breath and was noted to have room air pulse oximetry of 88%.  He was tachypneic with respiratory rate in the 30s and pulse oximetry improved following oxygen supplementation at 3 L to 97%. He denies having any nausea, no vomiting, no urinary frequency, nocturia or dysuria, nocturia or dysuria, no chest pain, no palpitations, no dizziness, no lightheadedness, no abdominal pain, no changes in his bowel habits, no headaches or any mental status changes. Labs show sodium 128, potassium 3.2, chloride 88, bicarb 26, glucose 318, BUN 17, creatinine 0.97, calcium 8.5, procalcitonin less than 0.10, white count 5.4, hemoglobin 14.7, hematocrit 42.8, MCV 81.4, RDW 12.9, platelet count 204  Respiratory viral panel is pending Chest x-ray reviewed by me shows new moderate patchy perihilar lung opacities, left greater than right bilaterally compatible with COVID-19 pneumonia   ED Course: Patient is a 64 year old  Caucasian male who is unvaccinated against the COVID-19 virus and presents to the emergency room for evaluation of worsening shortness of breath.  He was hypoxic upon arrival to the ER with room air pulse oximetry of  87%, this improved following oxygen supplementation at 3 L to 97%.  Chest x-ray is consistent with Covid pneumonia.  He will be admitted to the hospital for further evaluation.  Review of Systems: As per HPI otherwise all negative   Past Medical History:  Diagnosis Date  . Arthritis    BONE SPURS LOWER BACK AND ARTHRITIS IN NECK  . Diabetes mellitus without complication (HCC)    ORAL MEDICATION - NO INSULIN  . History of kidney stones   . Hypertension   . Low testosterone   . OSA on CPAP     Past Surgical History:  Procedure Laterality Date  . COLONOSCOPY    . HERNIA REPAIR    . INSERTION OF MESH N/A 10/24/2013   Procedure: INSERTION OF MESH;  Surgeon: Atilano Ina, MD;  Location: [facility];  Service: General;  Laterality: N/A;  . KNEE ARTHROCENTESIS  2008  . LITHOTRIPSY    . NOSE SURGERY  1978 and 1984  . UMBILICAL HERNIA REPAIR N/A 10/24/2013   Procedure: LAPAROSCOPIC ASSISTED UMBILICAL HERNIA;  Surgeon: Atilano Ina, MD;  Location: [facility];  Service: General;  Laterality: N/A;     reports that he has never smoked. He has quit using smokeless tobacco. He reports current alcohol use. He reports that he does not use drugs.  Allergies  Allergen Reactions  . Amoxicillin Rash    No family history on file.   Prior to Admission medications   Medication Sig Start Date End Date Taking? Authorizing Provider  carvedilol (COREG) 6.25 MG tablet Take 6.25 mg by mouth 2 (two) times daily.   Yes  [provider]  empagliflozin (JARDIANCE) 10 MG TABS tablet Take 10 mg by mouth daily.   Yes [provider]  felodipine (PLENDIL) 10 MG 24 hr tablet Take 10 mg by mouth daily. 11/04/19  Yes [provider]  losartan-hydrochlorothiazide (HYZAAR) 100-25 MG tablet Take 1 tablet by mouth daily.   Yes [provider]  metFORMIN (GLUCOPHAGE-XR) 500 MG 24 hr tablet Take 1,500 mg by mouth daily with breakfast.   Yes [provider]  TRULICITY 1.5 MG/0.5ML  SOPN Inject 1.5 mg into the skin once a week. 02/11/20  Yes [provider]    Physical Exam: Vitals:   02/20/20 1301 02/20/20 1302  BP: 138/72   Pulse: 86   Resp: 20   Temp: 100.1 F (37.8 C)   TempSrc: Oral   SpO2: (!) 87%   Weight:  102.1 kg  Height:  5\' 8"  (1.727 m)     Vitals:   02/20/20 1301 02/20/20 1302  BP: 138/72   Pulse: 86   Resp: 20   Temp: 100.1 F (37.8 C)   TempSrc: Oral   SpO2: (!) 87%   Weight:  102.1 kg  Height:  5\' 8"  (1.727 m)    Constitutional: NAD, alert and oriented x 3.  Conversational dyspnea Eyes: PERRL, lids and conjunctivae normal ENMT: Mucous membranes are moist.  Neck: normal, supple, no masses, no thyromegaly Respiratory:  Bilateral air entry, no wheezing, no crackles.  Tachypneic, no accessory muscle use.  Cardiovascular: Regular rate and rhythm, no murmurs / rubs / gallops. No extremity edema. 2+ pedal pulses. No carotid bruits.  Abdomen: no tenderness, no masses palpated. No hepatosplenomegaly. Bowel sounds positive.  Musculoskeletal: no clubbing / cyanosis. No joint deformity upper and lower extremities.  Skin: no rashes, lesions, ulcers.  Neurologic: No gross focal neurologic deficit. Psychiatric: Normal mood and affect.   Labs on Admission: I have personally reviewed following labs and imaging studies  CBC: Recent Labs  Lab 02/20/20 1316  WBC 5.4  HGB 14.7  HCT 42.8  MCV 81.4  PLT 204   Basic Metabolic Panel: Recent Labs  Lab 02/20/20 1316  NA 128*  K 3.2*  CL 88*  CO2 26  GLUCOSE 318*  BUN 17  CREATININE 0.97  CALCIUM 8.5*   GFR: Estimated Creatinine Clearance: 91.5 mL/min (by C-G formula based on SCr of 0.97 mg/dL). Liver Function Tests: No results for input(s): AST, ALT, ALKPHOS, BILITOT, PROT, ALBUMIN in the last 168 hours. No results for input(s): LIPASE, AMYLASE in the last 168 hours. No results for input(s): AMMONIA in the last 168 hours. Coagulation Profile: No results for input(s): INR,  PROTIME in the last 168 hours. Cardiac Enzymes: No results for input(s): CKTOTAL, CKMB, CKMBINDEX, TROPONINI in the last 168 hours. BNP (last 3 results) No results for input(s): PROBNP in the last 8760 hours. HbA1C: No results for input(s): HGBA1C in the last 72 hours. CBG: No results for input(s): GLUCAP in the last 168 hours. Lipid Profile: No results for input(s): CHOL, HDL, LDLCALC, TRIG, CHOLHDL, LDLDIRECT in the last 72 hours. Thyroid Function Tests: No results for input(s): TSH, T4TOTAL, FREET4, T3FREE, THYROIDAB in the last 72 hours. Anemia Panel: No results for input(s): VITAMINB12, FOLATE, FERRITIN, TIBC, IRON, RETICCTPCT in the last 72 hours. Urine analysis: No results found for: COLORURINE, APPEARANCEUR, LABSPEC, PHURINE, GLUCOSEU, HGBUR, BILIRUBINUR, KETONESUR, PROTEINUR, UROBILINOGEN, NITRITE, LEUKOCYTESUR  Radiological Exams on Admission: DG Chest 2 View  Result Date: 02/20/2020 CLINICAL DATA:  COVID for 10 days with  persistent dyspnea and hypoxia EXAM: CHEST - 2 VIEW COMPARISON:  10/21/2013 chest radiograph. FINDINGS: Stable cardiomediastinal silhouette with normal heart size. No pneumothorax. No pleural effusion. Moderate patchy opacities throughout the peripheral lungs bilaterally, left greater than right, new. IMPRESSION: New moderate patchy peripheral lung opacities bilaterally, left greater than right, compatible with COVID-19 pneumonia. Electronically Signed   By: Delbert Phenix M.D.   On: 02/20/2020 13:33    EKG: Independently reviewed.   Assessment/Plan Principal Problem:   Pneumonia due to COVID-19 virus Active Problems:   Acute respiratory failure due to COVID-19 [facility])   Diabetes mellitus with hyperglycemia (HCC)   Hyponatremia   Hypokalemia      Pneumonia due to COVID-19 virus with acute respiratory failure Patient presents to the emergency room for evaluation of worsening shortness of breath and chest x-ray shows bilateral patchy lung opacities  compatible with COVID-19 pneumonia His COVID-19 PCR test was positive on 02/16/20 even though he had symptoms for about a week prior to testing positive He was hypoxic upon arrival to the ER and tachypneic with room air pulse oximetry of 87%, this improved following oxygen supplementation at 3 L to 97% We will start patient on remdesivir per protocol Start patient on Decadron 6 mg IV daily Supportive care with antitussives and bronchodilator therapy   Diabetes mellitus with hyperglycemia Hold all oral hypoglycemic agents We will place patient on long-acting insulin Sliding scale insulin  coverage with Accu-Cheks before meals and at bedtime Maintain consistent carbohydrate diet    Hyponatremia/hypokalemia Most likely secondary to diuretic use Patient was on hydrochlorothiazide which is currently on hold Supplement potassium Encourage oral fluid intake    Hypertension Continue Cozaar, Plendil and  Carvedilol   DVT prophylaxis: Lovenox Code Status: Full code Family Communication: Greater than 50% of time was spent discussing plan of care with patient at the bedside. All questions and concerns have been addressed. He verbalizes understanding and agrees with the plan Disposition Plan: Back to previous home environment Consults called: None    Johniya Durfee MD [facility]     02/20/2020, 3:19 PM

## 2020-02-20 NOTE — ED Provider Notes (Signed)
[facility] Emergency Department Provider Note   ____________________________________________   Event Date/Time   First MD Initiated Contact with Patient 02/20/20 1332     (approximate)  I have reviewed the triage vital signs and the nursing notes.   HISTORY  Chief Complaint Shortness of Breath    HPI Lee Adkins is a 64 y.o. male history of hypertension and kidney stones  Patient reports that he was diagnosed with Covid 19 through his doctor's office at [facility] about 10 days ago he is been having symptoms since about the sixth of this month.  He reports he started feeling fatigue and then started having cough runny nose and has had progressive worsening of shortness of breath with exertion now feeling short of breath even at rest over the last couple days  Went to his doctor's office today with EMS reports he had hypoxia which corrects on nasal cannula.  Patient reports just shortness of breath no real other symptom at this point other than fatigue  He has not had any previous Covid specific treatment such as steroids or antibody infusion.  Denies use of other medications such as ivermectin.  Has taken Mucinex occasionally   Past Medical History:  Diagnosis Date  . Arthritis    BONE SPURS LOWER BACK AND ARTHRITIS IN NECK  . Diabetes mellitus without complication (HCC)    ORAL MEDICATION - NO INSULIN  . History of kidney stones   . Hypertension   . Low testosterone   . OSA on CPAP     Patient Active Problem List   Diagnosis Date Noted  . Pneumonia due to COVID-19 virus 02/20/2020  . Type II or unspecified type diabetes mellitus without mention of complication, uncontrolled 10/25/2013  . OSA on CPAP 10/25/2013  . Umbilical hernia 10/24/2013    Past Surgical History:  Procedure Laterality Date  . COLONOSCOPY    . HERNIA REPAIR    . INSERTION OF MESH N/A 10/24/2013   Procedure: INSERTION OF MESH;  Surgeon: Atilano Ina, MD;   Location: [facility];  Service: General;  Laterality: N/A;  . KNEE ARTHROCENTESIS  2008  . LITHOTRIPSY    . NOSE SURGERY  1978 and 1984  . UMBILICAL HERNIA REPAIR N/A 10/24/2013   Procedure: LAPAROSCOPIC ASSISTED UMBILICAL HERNIA;  Surgeon: Atilano Ina, MD;  Location: [facility];  Service: General;  Laterality: N/A;    Prior to Admission medications   Medication Sig Start Date End Date Taking? Authorizing Provider  metFORMIN (GLUCOPHAGE-XR) 500 MG 24 hr tablet Take 1,500 mg by mouth daily with breakfast.   Yes [provider]    Allergies Amoxicillin  No family history on file.  Social History Social History   Tobacco Use  . Smoking status: Never Smoker  . Smokeless tobacco: Former Clinical biochemist  . Vaping Use: Never used  Substance Use Topics  . Alcohol use: Yes    Comment: RARELY DOES ALCOHOL  . Drug use: No    Review of Systems Constitutional: No chills but is having fatigue and reports he thinks he might still be having some fevers from time to time Eyes: No visual changes.  Had nasal congestion ENT: No sore throat. Cardiovascular: Denies chest pain. Respiratory: See HPI Gastrointestinal: No abdominal pain.   Genitourinary: Negative for dysuria. Musculoskeletal: Negative for back pain.  No leg swelling.  No calf pain.  No history of blood clots Skin: Negative for rash. Neurological: Negative for headaches, areas of focal  weakness or numbness.    ____________________________________________   PHYSICAL EXAM:  VITAL SIGNS: ED Triage Vitals  Enc Vitals Group     BP 02/20/20 1301 138/72     Pulse Rate 02/20/20 1301 86     Resp 02/20/20 1301 20     Temp 02/20/20 1301 100.1 F (37.8 C)     Temp Source 02/20/20 1301 Oral     SpO2 02/20/20 1301 (!) 87 %     Weight 02/20/20 1302 225 lb (102.1 kg)     Height 02/20/20 1302 5\' 8"  (1.727 m)     Head Circumference --      Peak Flow --      Pain Score 02/20/20 1302 0     Pain Loc --      Pain Edu? --       Excl. in GC? --     Constitutional: Alert and oriented.  Mildly ill-appearing but in no distress.  Normal oxygen saturation on 2 L nasal cannula Eyes: Conjunctivae are normal. Head: Atraumatic. Nose: No congestion/rhinnorhea. Mouth/Throat: Mucous membranes are moist. Neck: No stridor.  Cardiovascular: Normal rate, regular rhythm. Grossly normal heart sounds.  Good peripheral circulation. Respiratory: Normal respiratory effort.  No retractions. Lungs CTAB except for mild crackles noted over the left lung base. Gastrointestinal: Soft and nontender. No distention. Musculoskeletal: No lower extremity tenderness nor edema. Neurologic:  Normal speech and language. No gross focal neurologic deficits are appreciated.  Skin:  Skin is warm, dry and intact. No rash noted. Psychiatric: Mood and affect are normal. Speech and behavior are normal.  ____________________________________________   LABS (all labs ordered are listed, but only abnormal results are displayed)  Labs Reviewed  BASIC METABOLIC PANEL - Abnormal; Notable for the following components:      Result Value   Sodium 128 (*)    Potassium 3.2 (*)    Chloride 88 (*)    Glucose, Bld 318 (*)    Calcium 8.5 (*)    All other components within normal limits  CULTURE, BLOOD (ROUTINE X 2)  CULTURE, BLOOD (ROUTINE X 2)  RESP PANEL BY RT-PCR (FLU A&B, COVID) ARPGX2  CBC  PROCALCITONIN  HIV ANTIBODY (ROUTINE TESTING W REFLEX)   ____________________________________________  RADIOLOGY  DG Chest 2 View  Result Date: 02/20/2020 CLINICAL DATA:  COVID for 10 days with persistent dyspnea and hypoxia EXAM: CHEST - 2 VIEW COMPARISON:  10/21/2013 chest radiograph. FINDINGS: Stable cardiomediastinal silhouette with normal heart size. No pneumothorax. No pleural effusion. Moderate patchy opacities throughout the peripheral lungs bilaterally, left greater than right, new. IMPRESSION: New moderate patchy peripheral lung opacities bilaterally, left  greater than right, compatible with COVID-19 pneumonia. Electronically Signed   By: 10/23/2013 M.D.   On: 02/20/2020 13:33    Personally viewed the patient's chest x-ray.  In light of his left lung crackles and review of his x-ray which I think shows left greater than right opacification I do have concern for possible associated bacterial superinfection as well as COVID-19.  Will start patient on azithromycin, denies any recent hospitalizations or antibiotic use ____________________________________________   PROCEDURES  Procedure(s) performed: None  Procedures  Critical Care performed: No  ____________________________________________   INITIAL IMPRESSION / ASSESSMENT AND PLAN / ED COURSE  Pertinent labs & imaging results that were available during my care of the patient were reviewed by me and considered in my medical decision making (see chart for details).   Patient presents for hypoxia in the setting of known COVID-19 per  the patient.  He is not able to produce a positive test documentation, thus we will retest here.  However based on his history is deemed a good historian and reasonable historian I believe he is COVID-19 and possibly associated bacterial superinfection which will start antibiotics and draw cultures for.  Await procalcitonin and further work-up, but given his chest x-ray findings and rales in the left lower base I have suspicion for bacterial superinfection as well.  Labs reassuring with normal CBC.  Procalcitonin less than 0.1.  Glucose is elevated at 318 he is a diabetic.  Mild hyponatremia and hypokalemia, fluid bolus ordered as well as repletion of potassium.  Admission discussed with Dr. Joylene Igo.  Patient understanding agreeable with plan for admission and treatment recommendations as well        ____________________________________________   FINAL CLINICAL IMPRESSION(S) / ED DIAGNOSES  Final diagnoses:  Hypoxia  Suspected COVID-19 virus infection         Note:  This document was prepared using Dragon voice recognition software and may include unintentional dictation errors       Sharyn Creamer, MD 02/20/20 1411

## 2020-02-20 NOTE — Consult Note (Signed)
Remdesivir - Pharmacy Brief Note   O:  Reportedly Covid positive x10d. SOB SpO2: 87% on 3L   A/P:  Remdesivir 200 mg IVPB once followed by 100 mg IVPB daily x 4 days.   .me 02/20/2020 2:09 PM

## 2020-02-20 NOTE — ED Triage Notes (Signed)
Pt comes into the ED via EMS from [facility] services with c/o SOB, Covid +.. 88%RA, on 3L [place] now 97%. No other complaints

## 2020-02-20 NOTE — ED Notes (Signed)
After going through pt's chart, this RN noticed that EKG was not completed in triage. EKG done at this time - shown to MD

## 2020-02-20 NOTE — ED Triage Notes (Signed)
Says covid x 10 days and no better.  Says feels like he can't take a deep breath.  Says his oxygen was in 80s today, but this is the first he's checked it.  Able to speak in full sentences.  Has oxygen on at 3 liters now with sat 87%

## 2020-02-20 NOTE — ED Notes (Signed)
First interaction with pt at this time - pt not placed on monitor when brought to room

## 2020-02-21 LAB — CBC WITH DIFFERENTIAL/PLATELET
Abs Immature Granulocytes: 0.05 10*3/uL (ref 0.00–0.07)
Basophils Absolute: 0 10*3/uL (ref 0.0–0.1)
Basophils Relative: 0 %
Eosinophils Absolute: 0 10*3/uL (ref 0.0–0.5)
Eosinophils Relative: 0 %
HCT: 38.7 % — ABNORMAL LOW (ref 39.0–52.0)
Hemoglobin: 13 g/dL (ref 13.0–17.0)
Immature Granulocytes: 2 %
Lymphocytes Relative: 16 %
Lymphs Abs: 0.5 10*3/uL — ABNORMAL LOW (ref 0.7–4.0)
MCH: 27.7 pg (ref 26.0–34.0)
MCHC: 33.6 g/dL (ref 30.0–36.0)
MCV: 82.3 fL (ref 80.0–100.0)
Monocytes Absolute: 0.3 10*3/uL (ref 0.1–1.0)
Monocytes Relative: 10 %
Neutro Abs: 2.4 10*3/uL (ref 1.7–7.7)
Neutrophils Relative %: 72 %
Platelets: 206 10*3/uL (ref 150–400)
RBC: 4.7 MIL/uL (ref 4.22–5.81)
RDW: 12.4 % (ref 11.5–15.5)
Smear Review: NORMAL
WBC: 3.3 10*3/uL — ABNORMAL LOW (ref 4.0–10.5)
nRBC: 0 % (ref 0.0–0.2)

## 2020-02-21 LAB — COMPREHENSIVE METABOLIC PANEL
ALT: 24 U/L (ref 0–44)
AST: 26 U/L (ref 15–41)
Albumin: 2.7 g/dL — ABNORMAL LOW (ref 3.5–5.0)
Alkaline Phosphatase: 78 U/L (ref 38–126)
Anion gap: 13 (ref 5–15)
BUN: 23 mg/dL (ref 8–23)
CO2: 26 mmol/L (ref 22–32)
Calcium: 8.5 mg/dL — ABNORMAL LOW (ref 8.9–10.3)
Chloride: 92 mmol/L — ABNORMAL LOW (ref 98–111)
Creatinine, Ser: 1.1 mg/dL (ref 0.61–1.24)
GFR, Estimated: >60 mL/min (ref >60)
Glucose, Bld: 446 mg/dL — ABNORMAL HIGH (ref 70–99)
Potassium: 3.7 mmol/L (ref 3.5–5.1)
Sodium: 131 mmol/L — ABNORMAL LOW (ref 135–145)
Total Bilirubin: 0.9 mg/dL (ref 0.3–1.2)
Total Protein: 7.1 g/dL (ref 6.5–8.1)

## 2020-02-21 LAB — CBG MONITORING, ED
Glucose-Capillary: 336 mg/dL — ABNORMAL HIGH (ref 70–99)
Glucose-Capillary: 300 mg/dL — ABNORMAL HIGH (ref 70–99)
Glucose-Capillary: 349 mg/dL — ABNORMAL HIGH (ref 70–99)
Glucose-Capillary: 482 mg/dL — ABNORMAL HIGH (ref 70–99)

## 2020-02-21 LAB — C-REACTIVE PROTEIN: CRP: 9.5 mg/dL — ABNORMAL HIGH (ref <1.0)

## 2020-02-21 LAB — MAGNESIUM: Magnesium: 2.2 mg/dL (ref 1.7–2.4)

## 2020-02-21 LAB — FERRITIN: Ferritin: 556 ng/mL — ABNORMAL HIGH (ref 24–336)

## 2020-02-21 LAB — BRAIN NATRIURETIC PEPTIDE: B Natriuretic Peptide: 96.6 pg/mL (ref 0.0–100.0)

## 2020-02-21 LAB — FIBRIN DERIVATIVES D-DIMER (ARMC ONLY): Fibrin derivatives D-dimer (ARMC): 551.94 ng/mL (FEU) — ABNORMAL HIGH (ref 0.00–499.00)

## 2020-02-21 LAB — PHOSPHORUS: Phosphorus: 3 mg/dL (ref 2.5–4.6)

## 2020-02-21 MED ORDER — INSULIN DETEMIR 100 UNIT/ML ~~LOC~~ SOLN
30.0000 [IU] | Freq: Two times a day (BID) | SUBCUTANEOUS | Status: DC
  Administered 2020-02-21: 30 [IU] via SUBCUTANEOUS
  Filled 2020-02-21 (×3): qty 0.3

## 2020-02-21 MED ORDER — FUROSEMIDE 10 MG/ML IJ SOLN
40.0000 mg | Freq: Once | INTRAMUSCULAR | Status: AC
  Administered 2020-02-21: 40 mg via INTRAVENOUS
  Filled 2020-02-21: qty 4

## 2020-02-21 MED ORDER — BARICITINIB 1 MG PO TABS
4.0000 mg | ORAL_TABLET | Freq: Every day | ORAL | Status: DC
  Administered 2020-02-23 – 2020-02-26 (×3): 4 mg via ORAL
  Filled 2020-02-21 (×3): qty 2
  Filled 2020-02-21 (×2): qty 4
  Filled 2020-02-21: qty 2

## 2020-02-21 NOTE — ED Notes (Signed)
Pt given meal tray.

## 2020-02-21 NOTE — ED Notes (Signed)
Patient transitioned to hospital bed to promote comfort.

## 2020-02-21 NOTE — Progress Notes (Signed)
Inpatient Diabetes Program Recommendations  AACE/ADA: New Consensus Statement on Inpatient Glycemic Control   Target Ranges:  Prepandial:   less than 140 mg/dL      Peak postprandial:   less than 180 mg/dL (1-2 hours)      Critically ill patients:  140 - 180 mg/dL  Results for KORTNEY, SCHOENFELDER (MRN [id number]) as of 02/21/2020 08:53  Ref. Range 02/21/2020 04:22  Glucose Latest Ref Range: 70 - 99 mg/dL 557 (H)   Results for CABOT, CROMARTIE (MRN [id number]) as of 02/21/2020 08:53  Ref. Range 02/20/2020 15:54 02/20/2020 17:26 02/20/2020 18:07 02/20/2020 19:18 02/20/2020 22:30  Glucose-Capillary Latest Ref Range: 70 - 99 mg/dL 062 (H) 376 (H) 283 (H) 402 (H) 386 (H)   Review of Glycemic Control  Diabetes history: DM2 Outpatient Diabetes medications: Jardiance 10 mg daily, Metformin XR 1500 mg QAM, Trulicity 1.5 mg Qweek Current orders for Inpatient glycemic control: Levemir 15 units BID, Novolog 0-15 units TID with meals; Decadron 6 mg Q24H  Inpatient Diabetes Program Recommendations:    Insulin: If steroids are continued as ordered, please consider increasing Levemir to 20 units BID, Novolog correction to 0-20 units AC&HS, and adding Novolog 6 units TID with meals for meal coverage if patient eats at least 50% of meals.  Thanks, Orlando Penner, RN, MSN, CDE Diabetes Coordinator Inpatient Diabetes Program [phone] (Team Pager from 8am to 5pm)

## 2020-02-21 NOTE — Progress Notes (Signed)
PROGRESS NOTE    GUIDO COMP  BHA:[id number] DOB: 05-09-1955 DOA: 02/20/2020 PCP: Abram Sander, MD   Chief complaint.  Shortness of breath Brief Narrative:   Lee Adkins is a 64 y.o. male with medical history significant for hypertension and diabetes mellitus who presents to the emergency room for evaluation of worsening shortness of breath.  Patient has had symptoms since 12 /04/21 which he describes as low-grade fever, cough, anorexia and myalgias.  He was tested for the COVID-19 virus on 02/14/20 and his PCR test came back positive on 02/16/20.  Upon arriving the emergency room, patient had a significant hypoxemia, currently 13 L oxygen.  He is treated with steroids and remdesivir.  Baricitinib added on 12/17 after talking with the patient.  Assessment & Plan:   Principal Problem:   Pneumonia due to COVID-19 virus Active Problems:   Acute respiratory failure due to COVID-19 (HCC)   Diabetes mellitus with hyperglycemia (HCC)   Hyponatremia   Hypokalemia  #1.  Acute hypoxemic respiratory failure secondary to COVID-19 pneumonia. COVID-19 pneumonia. I have personally reviewed the patient's chest x-ray images, bilateral patchy infiltrates, more on the left side. Patient has developed a severe hypoxemia, I will continue steroids and remdesivir.  Patient also has significant elevation in CRP, added baricitinib after discussion with the patient. Patient BNP not significant elevated, but has significant crackles in the base bilaterally.  We will give a dose of IV Lasix. Procalcitonin level less than 0.1, no evidence of bacterial pneumonia. Continue prophylaxis with Lovenox, monitor D-dimer. Patient condition still serious, high risk of deterioration.  2.  Uncontrolled type 2 diabetes with hyperglycemia. Glucose still running high, increase dose of Levemir to 30 units twice a day.  Continue sliding scale insulin.  3.  Hyponatremia and hypokalemia. Potassium normalized.   Continue to follow.  4.  Essential hypertension. Continue home medicines.  5.  Leukopenia. Secondary to Covid.  Follow.     DVT prophylaxis: Lovenox Code Status: Full Family Communication: Not able to reach wife Disposition Plan:     Status is: Inpatient  Remains inpatient appropriate because:Inpatient level of care appropriate due to severity of illness   Dispo: The patient is from: Home              Anticipated d/c is to: Home              Anticipated d/c date is: > 3 days              Patient currently is not medically stable to d/c.        I/O last 3 completed shifts: In: 1047.7 [IV Piggyback:1047.7] Out: 600 [Urine:600] No intake/output data recorded.     Consultants:   None  Procedures: None  Antimicrobials: None  Subjective: Patient currently on 12 L oxygen, short of breath with exertion.  But he feels better than yesterday. Cough, largely nonproductive. He did not have any paroxysmal active dyspnea, no chest pain. No abdominal pain or nausea vomiting.  No diarrhea constipation. No dysuria hematuria. No fever chills Does not have any headache or dizziness.  Objective: Vitals:   02/21/20 1000 02/21/20 1100 02/21/20 1130 02/21/20 1200  BP: (!) 161/80 (!) 145/76 128/72 104/66  Pulse: (!) 57 72 77 65  Resp: (!) 28 (!) 27 (!) 21 (!) 24  Temp:      TempSrc:      SpO2: 100%  90% 95%  Weight:      Height:  Intake/Output Summary (Last 24 hours) at 02/21/2020 1428 Last data filed at 02/20/2020 2047 Gross per 24 hour  Intake 1047.67 ml  Output 600 ml  Net 447.67 ml   Filed Weights   02/20/20 1302  Weight: 102.1 kg    Examination:  General exam: Appears calm and comfortable  Respiratory system: Significant crackles in bilateral lower base, left side more than right.Marland Kitchen Respiratory effort normal. Cardiovascular system: S1 & S2 heard, RRR. No JVD, murmurs, rubs, gallops or clicks. No pedal edema. Gastrointestinal system: Abdomen is  nondistended, soft and nontender. No organomegaly or masses felt. Normal bowel sounds heard. Central nervous system: Alert and oriented. No focal neurological deficits. Extremities: Symmetric  Skin: No rashes, lesions or ulcers Psychiatry:  Mood & affect appropriate.     Data Reviewed: I have personally reviewed following labs and imaging studies  CBC: Recent Labs  Lab 02/20/20 1316 02/21/20 0422  WBC 5.4 3.3*  NEUTROABS  --  2.4  HGB 14.7 13.0  HCT 42.8 38.7*  MCV 81.4 82.3  PLT 204 206   Basic Metabolic Panel: Recent Labs  Lab 02/20/20 1316 02/21/20 0422  NA 128* 131*  K 3.2* 3.7  CL 88* 92*  CO2 26 26  GLUCOSE 318* 446*  BUN 17 23  CREATININE 0.97 1.10  CALCIUM 8.5* 8.5*  MG  --  2.2  PHOS  --  3.0   GFR: Estimated Creatinine Clearance: 80.7 mL/min (by C-G formula based on SCr of 1.1 mg/dL). Liver Function Tests: Recent Labs  Lab 02/21/20 0422  AST 26  ALT 24  ALKPHOS 78  BILITOT 0.9  PROT 7.1  ALBUMIN 2.7*   No results for input(s): LIPASE, AMYLASE in the last 168 hours. No results for input(s): AMMONIA in the last 168 hours. Coagulation Profile: No results for input(s): INR, PROTIME in the last 168 hours. Cardiac Enzymes: No results for input(s): CKTOTAL, CKMB, CKMBINDEX, TROPONINI in the last 168 hours. BNP (last 3 results) No results for input(s): PROBNP in the last 8760 hours. HbA1C: No results for input(s): HGBA1C in the last 72 hours. CBG: Recent Labs  Lab 02/20/20 1807 02/20/20 1918 02/20/20 2230 02/21/20 1005 02/21/20 1352  GLUCAP 406* 402* 386* 336* 300*   Lipid Profile: No results for input(s): CHOL, HDL, LDLCALC, TRIG, CHOLHDL, LDLDIRECT in the last 72 hours. Thyroid Function Tests: No results for input(s): TSH, T4TOTAL, FREET4, T3FREE, THYROIDAB in the last 72 hours. Anemia Panel: Recent Labs    02/21/20 0422  FERRITIN 556*   Sepsis Labs: Recent Labs  Lab 02/20/20 1316  PROCALCITON <0.10    Recent Results (from  the past 240 hour(s))  Resp Panel by RT-PCR (Flu A&B, Covid) Nasopharyngeal Swab     Status: Abnormal   Collection Time: 02/20/20  2:11 PM   Specimen: Nasopharyngeal Swab; Nasopharyngeal(NP) swabs in vial transport medium  Result Value Ref Range Status   SARS Coronavirus 2 by RT PCR POSITIVE (A) NEGATIVE Final    Comment: RESULT CALLED TO, READ BACK BY AND VERIFIED WITH: JANE MARTIN 02/20/20 1517 KLW (NOTE) SARS-CoV-2 target nucleic acids are DETECTED.  The SARS-CoV-2 RNA is generally detectable in upper respiratory specimens during the acute phase of infection. Positive results are indicative of the presence of the identified virus, but do not rule out bacterial infection or co-infection with other pathogens not detected by the test. Clinical correlation with patient history and other diagnostic information is necessary to determine patient infection status. The expected result is Negative.  Fact Sheet for  Patients: BloggerCourse.com  Fact Sheet for Healthcare Providers: SeriousBroker.it  This test is not yet approved or cleared by the [place] FDA and  has been authorized for detection and/or diagnosis of SARS-CoV-2 by FDA under an Emergency Use Authorization (EUA).  This EUA will remain in effect (meaning this test can be use d) for the duration of  the COVID-19 declaration under Section 564(b)(1) of the Act, 21 U.S.C. section 360bbb-3(b)(1), unless the authorization is terminated or revoked sooner.     Influenza A by PCR NEGATIVE NEGATIVE Final   Influenza B by PCR NEGATIVE NEGATIVE Final    Comment: (NOTE) The Xpert Xpress SARS-CoV-2/FLU/RSV plus assay is intended as an aid in the diagnosis of influenza from Nasopharyngeal swab specimens and should not be used as a sole basis for treatment. Nasal washings and aspirates are unacceptable for Xpert Xpress SARS-CoV-2/FLU/RSV testing.  Fact Sheet for  Patients: BloggerCourse.com  Fact Sheet for Healthcare Providers: SeriousBroker.it  This test is not yet approved or cleared by the [place] FDA and has been authorized for detection and/or diagnosis of SARS-CoV-2 by FDA under an Emergency Use Authorization (EUA). This EUA will remain in effect (meaning this test can be used) for the duration of the COVID-19 declaration under Section 564(b)(1) of the Act, 21 U.S.C. section 360bbb-3(b)(1), unless the authorization is terminated or revoked.  Performed at [facility], [address] [place], [place] [address]          Radiology Studies: DG Chest 2 View  Result Date: 02/20/2020 CLINICAL DATA:  COVID for 10 days with persistent dyspnea and hypoxia EXAM: CHEST - 2 VIEW COMPARISON:  10/21/2013 chest radiograph. FINDINGS: Stable cardiomediastinal silhouette with normal heart size. No pneumothorax. No pleural effusion. Moderate patchy opacities throughout the peripheral lungs bilaterally, left greater than right, new. IMPRESSION: New moderate patchy peripheral lung opacities bilaterally, left greater than right, compatible with COVID-19 pneumonia. Electronically Signed   By: Delbert Phenix M.D.   On: 02/20/2020 13:33        Scheduled Meds:  albuterol  2 puff Inhalation Q6H   vitamin C  500 mg Oral Daily   baricitinib  4 mg Oral Daily   carvedilol  6.25 mg Oral BID   dexamethasone (DECADRON) injection  6 mg Intravenous Q24H   enoxaparin (LOVENOX) injection  50 mg Subcutaneous Q24H   felodipine  10 mg Oral Daily   furosemide  40 mg Intravenous Once   insulin aspart  0-15 Units Subcutaneous TID WC   insulin detemir  0.15 Units/kg Subcutaneous BID   losartan  100 mg Oral Daily   potassium chloride  40 mEq Oral Daily   sodium chloride flush  3 mL Intravenous Q12H   zinc sulfate  220 mg Oral Daily   Continuous Infusions:  sodium chloride      remdesivir 100 mg in NS 100 mL Stopped (02/21/20 1204)     LOS: 1 day    Time spent: 38 minutes    Marrion Coy, MD [facility]   To contact the attending provider between 7A-7P or the covering provider during after hours 7P-7A, please log into the web site [email or web address] and access using universal [place] password for that web site. If you do not have the password, please call the hospital operator.  02/21/2020, 2:28 PM

## 2020-02-21 NOTE — ED Notes (Signed)
Patient provided with popsicle and ice cream per request.

## 2020-02-21 NOTE — ED Notes (Signed)
This RN at bedside to answer call bell. Pt stating he was sleeping and woke up suddenly diaphoretic, coughing and unable to catch his breath. Pt sitting on the side of the bed. Oxygen saturation 86-88% on 10L HFNC. Pt increased to 15L HFNC at this time with improvement to 96%. Pt instructed to take slow deep breaths. Pt assisted taking off soiled gown. MD made aware.

## 2020-02-22 DIAGNOSIS — J9601 Acute respiratory failure with hypoxia: Secondary | ICD-10-CM | POA: Diagnosis present

## 2020-02-22 LAB — COMPREHENSIVE METABOLIC PANEL
ALT: 25 U/L (ref 0–44)
AST: 19 U/L (ref 15–41)
Albumin: 2.9 g/dL — ABNORMAL LOW (ref 3.5–5.0)
Alkaline Phosphatase: 91 U/L (ref 38–126)
Anion gap: 14 (ref 5–15)
BUN: 29 mg/dL — ABNORMAL HIGH (ref 8–23)
CO2: 29 mmol/L (ref 22–32)
Calcium: 9.5 mg/dL (ref 8.9–10.3)
Chloride: 93 mmol/L — ABNORMAL LOW (ref 98–111)
Creatinine, Ser: 1.01 mg/dL (ref 0.61–1.24)
GFR, Estimated: >60 mL/min (ref >60)
Glucose, Bld: 402 mg/dL — ABNORMAL HIGH (ref 70–99)
Potassium: 4.1 mmol/L (ref 3.5–5.1)
Sodium: 136 mmol/L (ref 135–145)
Total Bilirubin: 0.8 mg/dL (ref 0.3–1.2)
Total Protein: 7.9 g/dL (ref 6.5–8.1)

## 2020-02-22 LAB — CBC WITH DIFFERENTIAL/PLATELET
Abs Immature Granulocytes: 0.12 10*3/uL — ABNORMAL HIGH (ref 0.00–0.07)
Basophils Absolute: 0 10*3/uL (ref 0.0–0.1)
Basophils Relative: 0 %
Eosinophils Absolute: 0 10*3/uL (ref 0.0–0.5)
Eosinophils Relative: 0 %
HCT: 44.2 % (ref 39.0–52.0)
Hemoglobin: 14.7 g/dL (ref 13.0–17.0)
Immature Granulocytes: 2 %
Lymphocytes Relative: 10 %
Lymphs Abs: 0.7 10*3/uL (ref 0.7–4.0)
MCH: 27.6 pg (ref 26.0–34.0)
MCHC: 33.3 g/dL (ref 30.0–36.0)
MCV: 83.1 fL (ref 80.0–100.0)
Monocytes Absolute: 0.5 10*3/uL (ref 0.1–1.0)
Monocytes Relative: 8 %
Neutro Abs: 5.4 10*3/uL (ref 1.7–7.7)
Neutrophils Relative %: 80 %
Platelets: 269 10*3/uL (ref 150–400)
RBC: 5.32 MIL/uL (ref 4.22–5.81)
RDW: 12.6 % (ref 11.5–15.5)
WBC: 6.8 10*3/uL (ref 4.0–10.5)
nRBC: 0 % (ref 0.0–0.2)

## 2020-02-22 LAB — CBG MONITORING, ED
Glucose-Capillary: 364 mg/dL — ABNORMAL HIGH (ref 70–99)
Glucose-Capillary: 344 mg/dL — ABNORMAL HIGH (ref 70–99)
Glucose-Capillary: 329 mg/dL — ABNORMAL HIGH (ref 70–99)
Glucose-Capillary: 297 mg/dL — ABNORMAL HIGH (ref 70–99)

## 2020-02-22 LAB — MAGNESIUM: Magnesium: 2.2 mg/dL (ref 1.7–2.4)

## 2020-02-22 LAB — PHOSPHORUS: Phosphorus: 5.2 mg/dL — ABNORMAL HIGH (ref 2.5–4.6)

## 2020-02-22 LAB — FERRITIN: Ferritin: 500 ng/mL — ABNORMAL HIGH (ref 24–336)

## 2020-02-22 LAB — FIBRIN DERIVATIVES D-DIMER (ARMC ONLY): Fibrin derivatives D-dimer (ARMC): 529.54 ng/mL (FEU) — ABNORMAL HIGH (ref 0.00–499.00)

## 2020-02-22 LAB — C-REACTIVE PROTEIN: CRP: 5.1 mg/dL — ABNORMAL HIGH (ref <1.0)

## 2020-02-22 MED ORDER — INSULIN DETEMIR 100 UNIT/ML ~~LOC~~ SOLN
50.0000 [IU] | Freq: Two times a day (BID) | SUBCUTANEOUS | Status: DC
  Administered 2020-02-22 (×2): 50 [IU] via SUBCUTANEOUS
  Filled 2020-02-22 (×4): qty 0.5

## 2020-02-22 MED ORDER — INSULIN ASPART 100 UNIT/ML ~~LOC~~ SOLN
8.0000 [IU] | Freq: Three times a day (TID) | SUBCUTANEOUS | Status: DC
  Administered 2020-02-22: 8 [IU] via SUBCUTANEOUS
  Filled 2020-02-22: qty 1

## 2020-02-22 NOTE — ED Notes (Signed)
Pharmacy messaged regarding missing dose of cozaar at this time

## 2020-02-22 NOTE — Progress Notes (Signed)
PROGRESS NOTE    Lee Adkins  ZOX:[id number] DOB: 05-27-1955 DOA: 02/20/2020 PCP: Abram SanderAdamo, Elena M, MD   Chief complaint.  Shortness of breath Brief Narrative:  Lee ReekMichael T Monahanis a 64 y.o.malewith medical history significant forhypertension and diabetes mellitus who presents to the emergency room for evaluation of worsening shortness of breath.Patient has had symptoms since 12 /04/21which he describes as low-grade fever, cough, anorexia and myalgias. He was tested for the COVID-19 virus on 12/10/21and his PCR test came back positive on12/12/21. Upon arriving the emergency room, patient had a significant hypoxemia, currently 13 L oxygen.  He is treated with steroids and remdesivir.  Baricitinib added on 12/17 after talking with the patient.   Assessment & Plan:   Principal Problem:   Pneumonia due to COVID-19 virus Active Problems:   Acute respiratory failure due to COVID-19 (HCC)   Diabetes mellitus with hyperglycemia (HCC)   Hyponatremia   Hypokalemia  #1.  Acute hypoxemic respiratory failure secondary to COVID-19 pneumonia. COVID-19 pneumonia. Patient received 1 dose IV Lasix yesterday due to increased crackles in the base.  BNP was not elevated. No evidence of bacterial pneumonia. D-dimer only mildly elevated, on prophylaxis. Patient condition is relatively stable on high flow oxygen.  We will continue treatment with steroids, remdesivir, and baricitinib.  CRP is improving. Patient still need close monitoring, still has risk for deterioration.  2.  Uncontrolled type 2 diabetes with hyperglycemia. Levemir dose increased yesterday, will add scheduled NovoLog in addition to sliding scale insulin.  3.  Hyponatremia and hypokalemia. Improved.  4.  Essential hypertension. Continue home medicines per  5.  Leukopenia.  Secondary to Covid.  Stable.    DVT prophylaxis: Lovenox Code Status: Full Family Communication: daughter updated Disposition Plan:   .   Status is: Inpatient  Remains inpatient appropriate because:Inpatient level of care appropriate due to severity of illness   Dispo: The patient is from: Home              Anticipated d/c is to: Home              Anticipated d/c date is: > 3 days              Patient currently is not medically stable to d/c.        I/O last 3 completed shifts: In: -  Out: 1600 [Urine:1600] Total I/O In: -  Out: 300 [Urine:300]     Consultants:   none  Procedures: none  Antimicrobials: none  Subjective: Patient still on very high flow oxygen, but he feels better.  Short of breath has been improved.  He has a cough, nonproductive. He denies any abdominal pain nausea vomiting.  No diarrhea. No fever chills per No dysuria hematuria pain No chest pain or palpitation.  Objective: Vitals:   02/22/20 0600 02/22/20 0900 02/22/20 1024 02/22/20 1300  BP: 140/63 (!) 156/75 136/64 122/64  Pulse: (!) 53 63  63  Resp: 15 15  14   Temp:      TempSrc:      SpO2: 100% 93%  99%  Weight:      Height:        Intake/Output Summary (Last 24 hours) at 02/22/2020 1356 Last data filed at 02/22/2020 0920 Gross per 24 hour  Intake --  Output 1300 ml  Net -1300 ml   Filed Weights   02/20/20 1302  Weight: 102.1 kg    Examination:  General exam: Appears calm and comfortable  Respiratory system: Clear to  auscultation. Respiratory effort normal. Cardiovascular system: S1 & S2 heard, RRR. No JVD, murmurs, rubs, gallops or clicks. No pedal edema. Gastrointestinal system: Abdomen is nondistended, soft and nontender. No organomegaly or masses felt. Normal bowel sounds heard. Central nervous system: Alert and oriented. No focal neurological deficits. Extremities: Symmetric  Skin: No rashes, lesions or ulcers Psychiatry: Mood & affect appropriate.     Data Reviewed: I have personally reviewed following labs and imaging studies  CBC: Recent Labs  Lab 02/20/20 1316 02/21/20 0422  02/22/20 0446  WBC 5.4 3.3* 6.8  NEUTROABS  --  2.4 5.4  HGB 14.7 13.0 14.7  HCT 42.8 38.7* 44.2  MCV 81.4 82.3 83.1  PLT 204 206 269   Basic Metabolic Panel: Recent Labs  Lab 02/20/20 1316 02/21/20 0422 02/22/20 0446  NA 128* 131* 136  K 3.2* 3.7 4.1  CL 88* 92* 93*  CO2 26 26 29   GLUCOSE 318* 446* 402*  BUN 17 23 29*  CREATININE 0.97 1.10 1.01  CALCIUM 8.5* 8.5* 9.5  MG  --  2.2 2.2  PHOS  --  3.0 5.2*   GFR: Estimated Creatinine Clearance: 87.8 mL/min (by C-G formula based on SCr of 1.01 mg/dL). Liver Function Tests: Recent Labs  Lab 02/21/20 0422 02/22/20 0446  AST 26 19  ALT 24 25  ALKPHOS 78 91  BILITOT 0.9 0.8  PROT 7.1 7.9  ALBUMIN 2.7* 2.9*   No results for input(s): LIPASE, AMYLASE in the last 168 hours. No results for input(s): AMMONIA in the last 168 hours. Coagulation Profile: No results for input(s): INR, PROTIME in the last 168 hours. Cardiac Enzymes: No results for input(s): CKTOTAL, CKMB, CKMBINDEX, TROPONINI in the last 168 hours. BNP (last 3 results) No results for input(s): PROBNP in the last 8760 hours. HbA1C: No results for input(s): HGBA1C in the last 72 hours. CBG: Recent Labs  Lab 02/21/20 1352 02/21/20 1839 02/21/20 2113 02/22/20 0827 02/22/20 1307  GLUCAP 300* 349* 482* 364* 344*   Lipid Profile: No results for input(s): CHOL, HDL, LDLCALC, TRIG, CHOLHDL, LDLDIRECT in the last 72 hours. Thyroid Function Tests: No results for input(s): TSH, T4TOTAL, FREET4, T3FREE, THYROIDAB in the last 72 hours. Anemia Panel: Recent Labs    02/21/20 0422 02/22/20 0446  FERRITIN 556* 500*   Sepsis Labs: Recent Labs  Lab 02/20/20 1316  PROCALCITON <0.10    Recent Results (from the past 240 hour(s))  Culture, blood (Routine X 2) w Reflex to ID Panel     Status: None (Preliminary result)   Collection Time: 02/20/20  2:11 PM   Specimen: BLOOD  Result Value Ref Range Status   Specimen Description BLOOD BLOOD LEFT FOREARM  Final    Special Requests   Final    BOTTLES DRAWN AEROBIC AND ANAEROBIC Blood Culture adequate volume   Culture   Final    NO GROWTH 2 DAYS Performed at [facility], [address] [place], [place]    Report Status PENDING  Incomplete  Culture, blood (Routine X 2) w Reflex to ID Panel     Status: None (Preliminary result)   Collection Time: 02/20/20  2:11 PM   Specimen: BLOOD  Result Value Ref Range Status   Specimen Description BLOOD BLOOD RIGHT HAND  Final   Special Requests   Final    BOTTLES DRAWN AEROBIC AND ANAEROBIC Blood Culture results may not be optimal due to an inadequate volume of blood received in culture bottles   Culture  Final    NO GROWTH 2 DAYS Performed at [facility], [address] [place], [place] [address]    Report Status PENDING  Incomplete  Resp Panel by RT-PCR (Flu A&B, Covid) Nasopharyngeal Swab     Status: Abnormal   Collection Time: 02/20/20  2:11 PM   Specimen: Nasopharyngeal Swab; Nasopharyngeal(NP) swabs in vial transport medium  Result Value Ref Range Status   SARS Coronavirus 2 by RT PCR POSITIVE (A) NEGATIVE Final    Comment: RESULT CALLED TO, READ BACK BY AND VERIFIED WITH: JANE MARTIN 02/20/20 1517 KLW (NOTE) SARS-CoV-2 target nucleic acids are DETECTED.  The SARS-CoV-2 RNA is generally detectable in upper respiratory specimens during the acute phase of infection. Positive results are indicative of the presence of the identified virus, but do not rule out bacterial infection or co-infection with other pathogens not detected by the test. Clinical correlation with patient history and other diagnostic information is necessary to determine patient infection status. The expected result is Negative.  Fact Sheet for Patients: BloggerCourse.com  Fact Sheet for Healthcare Providers: SeriousBroker.it  This test is not yet approved or cleared by the [place] FDA and   has been authorized for detection and/or diagnosis of SARS-CoV-2 by FDA under an Emergency Use Authorization (EUA).  This EUA will remain in effect (meaning this test can be use d) for the duration of  the COVID-19 declaration under Section 564(b)(1) of the Act, 21 U.S.C. section 360bbb-3(b)(1), unless the authorization is terminated or revoked sooner.     Influenza A by PCR NEGATIVE NEGATIVE Final   Influenza B by PCR NEGATIVE NEGATIVE Final    Comment: (NOTE) The Xpert Xpress SARS-CoV-2/FLU/RSV plus assay is intended as an aid in the diagnosis of influenza from Nasopharyngeal swab specimens and should not be used as a sole basis for treatment. Nasal washings and aspirates are unacceptable for Xpert Xpress SARS-CoV-2/FLU/RSV testing.  Fact Sheet for Patients: BloggerCourse.com  Fact Sheet for Healthcare Providers: SeriousBroker.it  This test is not yet approved or cleared by the [place] FDA and has been authorized for detection and/or diagnosis of SARS-CoV-2 by FDA under an Emergency Use Authorization (EUA). This EUA will remain in effect (meaning this test can be used) for the duration of the COVID-19 declaration under Section 564(b)(1) of the Act, 21 U.S.C. section 360bbb-3(b)(1), unless the authorization is terminated or revoked.  Performed at [facility], [address] [place], [place] [address]          Radiology Studies: No results found.      Scheduled Meds: . albuterol  2 puff Inhalation Q6H  . vitamin C  500 mg Oral Daily  . baricitinib  4 mg Oral Daily  . carvedilol  6.25 mg Oral BID  . dexamethasone (DECADRON) injection  6 mg Intravenous Q24H  . enoxaparin (LOVENOX) injection  50 mg Subcutaneous Q24H  . felodipine  10 mg Oral Daily  . insulin aspart  0-15 Units Subcutaneous TID WC  . insulin aspart  8 Units Subcutaneous TID WC  . insulin detemir  50 Units Subcutaneous BID  .  losartan  100 mg Oral Daily  . potassium chloride  40 mEq Oral Daily  . sodium chloride flush  3 mL Intravenous Q12H  . zinc sulfate  220 mg Oral Daily   Continuous Infusions: . sodium chloride    . remdesivir 100 mg in NS 100 mL Stopped (02/22/20 0920)     LOS: 2 days    Time spent:  35 minutes    Marrion Coy, MD [facility]   To contact the attending provider between 7A-7P or the covering provider during after hours 7P-7A, please log into the web site [email or web address] and access using universal [employer] [place] password for that web site. If you do not have the password, please call the hospital operator.  02/22/2020, 1:56 PM

## 2020-02-22 NOTE — ED Notes (Signed)
Pt rounds completed.  Pt sitting up on side of bed in NAD.  Pt requested and given ginger ale at this time. Will continue to monitor.

## 2020-02-22 NOTE — ED Notes (Signed)
Meal tray given at this time 

## 2020-02-23 LAB — GLUCOSE, CAPILLARY
Glucose-Capillary: 217 mg/dL — ABNORMAL HIGH (ref 70–99)
Glucose-Capillary: 232 mg/dL — ABNORMAL HIGH (ref 70–99)
Glucose-Capillary: 358 mg/dL — ABNORMAL HIGH (ref 70–99)

## 2020-02-23 LAB — COMPREHENSIVE METABOLIC PANEL
ALT: 23 U/L (ref 0–44)
AST: 20 U/L (ref 15–41)
Albumin: 2.6 g/dL — ABNORMAL LOW (ref 3.5–5.0)
Alkaline Phosphatase: 68 U/L (ref 38–126)
Anion gap: 11 (ref 5–15)
BUN: 23 mg/dL (ref 8–23)
CO2: 24 mmol/L (ref 22–32)
Calcium: 8.7 mg/dL — ABNORMAL LOW (ref 8.9–10.3)
Chloride: 99 mmol/L (ref 98–111)
Creatinine, Ser: 0.69 mg/dL (ref 0.61–1.24)
GFR, Estimated: >60 mL/min (ref >60)
Glucose, Bld: 254 mg/dL — ABNORMAL HIGH (ref 70–99)
Potassium: 3.8 mmol/L (ref 3.5–5.1)
Sodium: 134 mmol/L — ABNORMAL LOW (ref 135–145)
Total Bilirubin: 0.8 mg/dL (ref 0.3–1.2)
Total Protein: 6.6 g/dL (ref 6.5–8.1)

## 2020-02-23 LAB — CBC WITH DIFFERENTIAL/PLATELET
Abs Immature Granulocytes: 0.08 10*3/uL — ABNORMAL HIGH (ref 0.00–0.07)
Basophils Absolute: 0 10*3/uL (ref 0.0–0.1)
Basophils Relative: 0 %
Eosinophils Absolute: 0 10*3/uL (ref 0.0–0.5)
Eosinophils Relative: 0 %
HCT: 38.6 % — ABNORMAL LOW (ref 39.0–52.0)
Hemoglobin: 13.3 g/dL (ref 13.0–17.0)
Immature Granulocytes: 1 %
Lymphocytes Relative: 10 %
Lymphs Abs: 0.7 10*3/uL (ref 0.7–4.0)
MCH: 28.4 pg (ref 26.0–34.0)
MCHC: 34.5 g/dL (ref 30.0–36.0)
MCV: 82.5 fL (ref 80.0–100.0)
Monocytes Absolute: 0.6 10*3/uL (ref 0.1–1.0)
Monocytes Relative: 9 %
Neutro Abs: 5.6 10*3/uL (ref 1.7–7.7)
Neutrophils Relative %: 80 %
Platelets: 229 10*3/uL (ref 150–400)
RBC: 4.68 MIL/uL (ref 4.22–5.81)
RDW: 12.5 % (ref 11.5–15.5)
WBC: 7 10*3/uL (ref 4.0–10.5)
nRBC: 0 % (ref 0.0–0.2)

## 2020-02-23 LAB — PHOSPHORUS: Phosphorus: 3.8 mg/dL (ref 2.5–4.6)

## 2020-02-23 LAB — MAGNESIUM: Magnesium: 1.9 mg/dL (ref 1.7–2.4)

## 2020-02-23 LAB — FIBRIN DERIVATIVES D-DIMER (ARMC ONLY): Fibrin derivatives D-dimer (ARMC): 505.37 ng/mL (FEU) — ABNORMAL HIGH (ref 0.00–499.00)

## 2020-02-23 LAB — FERRITIN: Ferritin: 397 ng/mL — ABNORMAL HIGH (ref 24–336)

## 2020-02-23 LAB — C-REACTIVE PROTEIN: CRP: 1.5 mg/dL — ABNORMAL HIGH (ref <1.0)

## 2020-02-23 LAB — CBG MONITORING, ED: Glucose-Capillary: 171 mg/dL — ABNORMAL HIGH (ref 70–99)

## 2020-02-23 MED ORDER — INSULIN DETEMIR 100 UNIT/ML ~~LOC~~ SOLN
55.0000 [IU] | Freq: Two times a day (BID) | SUBCUTANEOUS | Status: DC
  Administered 2020-02-23 – 2020-02-24 (×3): 55 [IU] via SUBCUTANEOUS
  Filled 2020-02-23 (×4): qty 0.55

## 2020-02-23 MED ORDER — METHYLPREDNISOLONE SODIUM SUCC 40 MG IJ SOLR
40.0000 mg | Freq: Three times a day (TID) | INTRAMUSCULAR | Status: DC
  Administered 2020-02-23 – 2020-02-25 (×6): 40 mg via INTRAVENOUS
  Filled 2020-02-23 (×6): qty 1

## 2020-02-23 MED ORDER — POLYETHYLENE GLYCOL 3350 17 G PO PACK
17.0000 g | PACK | Freq: Every day | ORAL | Status: DC
  Administered 2020-02-23: 17 g via ORAL
  Filled 2020-02-23 (×2): qty 1

## 2020-02-23 MED ORDER — INSULIN ASPART 100 UNIT/ML ~~LOC~~ SOLN
12.0000 [IU] | Freq: Three times a day (TID) | SUBCUTANEOUS | Status: DC
  Administered 2020-02-23 – 2020-02-24 (×4): 12 [IU] via SUBCUTANEOUS
  Filled 2020-02-23 (×4): qty 1

## 2020-02-23 NOTE — Progress Notes (Signed)
PROGRESS NOTE    Lee Adkins  QIW:[id number] DOB: 12/20/1955 DOA: 02/20/2020 PCP: Abram Sander, MD    Brief Narrative:  Lee Adkins is a 64 year old male with past medical history significant for essential hypertension, type 2 diabetes mellitus who presented to the ED with progressive shortness of breath.  Additionally, patient reports low-grade fever, cough, anorexia and myalgias.  Was tested for Covid-19 and positive on 02/14/2020.  In the ED, SPO2 was noted to be 87% on room air, with increased respiratory rate of 30.  Sodium 128, potassium 3.2, chloride 88, bicarb 26, glucose 318, BUN 17, creatinine 0.97, procalcitonin less than 0.10, DBC 5.4, hemoglobin 14.7, platelet count 204.  Chest x-ray with multifocal pneumonia consistent with Covid-19 viral infection.  Hospital service consulted for further evaluation, treatment and management of acute hypoxic respiratory failure secondary to Covid-19 viral pneumonia.   Assessment & Plan:   Principal Problem:   Pneumonia due to COVID-19 virus Active Problems:   Acute respiratory failure due to COVID-19 [facility])   Diabetes mellitus with hyperglycemia (HCC)   Hyponatremia   Hypokalemia   Acute hypoxemic respiratory failure (HCC)  Acute hypoxic respiratory failure secondary to acute Covid-19 viral pneumonia during the ongoing 2020/2021 Covid 19 Pandemic - POA Patient presenting to ED with progressive shortness of breath, diagnosed with Covid-19 on 02/14/2020.  Patient was noted to be hypoxic on presentation with SPO2 87% room air.  Chest x-ray consistent with left leg focal pneumonia. --COVID test: outside test reported + on 12/10, PCR positive on 12/16 --CRP 9.5>5.1>1.5 --ddimer 552>530>505 --Remdesivir, plan 5-day course (Day #4/5) --Baricitinib 4mg  PO daily (Day #1/14) --change decadron to Solumedrol 40mg  IV q8h --prone for 2-3hrs every 12hrs if able --Continue supplemental oxygen, titrate to maintain SPO2 greater than 92%, on  15L HFNC w/ spO2 96% --Continue supportive care with albuterol MDI prn, vitamin C, zinc, Tylenol, antitussives (benzonatate/ Mucinex/Tussionex) --Follow CBC, CMP, D-dimer, and CRP daily --Continue airborne/contact isolation precautions for 3 weeks from the day of diagnosis  The treatment plan and use of medications and known side effects were discussed with patient/family. Some of the medications used are based on case reports/anecdotal data.  All other medications being used in the management of COVID-19 based on limited study data.  Complete risks and long-term side effects are unknown, however in the best clinical judgment they seem to be of some benefit.  Patient wanted to proceed with treatment options provided.  Essential hypertension BP 149/78 this morning --Carvedilol 6.25 mg twice daily --Felodipine 10 mg p.o. daily --Losartan 100 mg p.o. daily  Type 2 diabetes mellitus Home medications include Jardiance 10 mg p.o. daily, Metformin 1500 mg p.o. daily, Trulicity 1.5 mg once weekly. --check Hemoglobin A1c --Levemir 45 units [place] BID --NovoLog 12 units [place] TIDAC --ISS for further coverage --continue to monitor CBGs qAC/HS   DVT prophylaxis: Lovenox Code Status: Full code Family Communication: Updated patient extensively at bedside, declines family contact at this point  Disposition Plan:  Status is: Inpatient  Remains inpatient appropriate because:Ongoing diagnostic testing needed not appropriate for outpatient work up, Unsafe d/c plan, IV treatments appropriate due to intensity of illness or inability to take PO and Inpatient level of care appropriate due to severity of illness   Dispo: The patient is from: Home              Anticipated d/c is to: Home              Anticipated d/c date is: 3  days              Patient currently is not medically stable to d/c.   Consultants:   None  Procedures:   None  Antimicrobials:   None   Subjective: Patient seen and  examined bedside, resting comfortably.  Continues in ED holding area.  On 15 L high flow nasal cannula.  Continues with significant shortness of breath at rest.  Has not been able to ambulate so far in ED.  Reports no bowel movement for roughly 1 week.  No other complaints or concerns at this time.  Denies headache, no visual changes, no chest pain, palpitations, no abdominal pain, no paresthesias.  No acute events overnight per nursing staff.  Objective: Vitals:   02/23/20 0441 02/23/20 0600 02/23/20 0747 02/23/20 1015  BP: (!) 156/71 (!) 149/78 (!) 139/57 (!) 154/76  Pulse: (!) 54 (!) 57 65 67  Resp: 18 18 20 20   Temp:    98.4 F (36.9 C)  TempSrc:    Oral  SpO2: 95% 96% 95% 98%  Weight:      Height:       No intake or output data in the 24 hours ending 02/23/20 1150 Filed Weights   02/20/20 1302  Weight: 102.1 kg    Examination:  General exam: Appears calm and comfortable  Respiratory system: Clear to auscultation. Respiratory effort normal.  On 15 L HFNC w/ SpO2 96% Cardiovascular system: S1 & S2 heard, RRR. No JVD, murmurs, rubs, gallops or clicks. No pedal edema. Gastrointestinal system: Abdomen is nondistended, soft and nontender. No organomegaly or masses felt. Normal bowel sounds heard. Central nervous system: Alert and oriented. No focal neurological deficits. Extremities: Symmetric 5 x 5 power. Skin: No rashes, lesions or ulcers Psychiatry: Judgement and insight appear normal. Mood & affect appropriate.     Data Reviewed: I have personally reviewed following labs and imaging studies  CBC: Recent Labs  Lab 02/20/20 1316 02/21/20 0422 02/22/20 0446 02/23/20 0433  WBC 5.4 3.3* 6.8 7.0  NEUTROABS  --  2.4 5.4 5.6  HGB 14.7 13.0 14.7 13.3  HCT 42.8 38.7* 44.2 38.6*  MCV 81.4 82.3 83.1 82.5  PLT 204 206 269 229   Basic Metabolic Panel: Recent Labs  Lab 02/20/20 1316 02/21/20 0422 02/22/20 0446 02/23/20 0433  NA 128* 131* 136 134*  K 3.2* 3.7 4.1 3.8  CL  88* 92* 93* 99  CO2 26 26 29 24   GLUCOSE 318* 446* 402* 254*  BUN 17 23 29* 23  CREATININE 0.97 1.10 1.01 0.69  CALCIUM 8.5* 8.5* 9.5 8.7*  MG  --  2.2 2.2 1.9  PHOS  --  3.0 5.2* 3.8   GFR: Estimated Creatinine Clearance: 110.9 mL/min (by C-G formula based on SCr of 0.69 mg/dL). Liver Function Tests: Recent Labs  Lab 02/21/20 0422 02/22/20 0446 02/23/20 0433  AST 26 19 20   ALT 24 25 23   ALKPHOS 78 91 68  BILITOT 0.9 0.8 0.8  PROT 7.1 7.9 6.6  ALBUMIN 2.7* 2.9* 2.6*   No results for input(s): LIPASE, AMYLASE in the last 168 hours. No results for input(s): AMMONIA in the last 168 hours. Coagulation Profile: No results for input(s): INR, PROTIME in the last 168 hours. Cardiac Enzymes: No results for input(s): CKTOTAL, CKMB, CKMBINDEX, TROPONINI in the last 168 hours. BNP (last 3 results) No results for input(s): PROBNP in the last 8760 hours. HbA1C: No results for input(s): HGBA1C in the last 72 hours. CBG: Recent Labs  Lab 02/22/20 0827 02/22/20 1307 02/22/20 1735 02/22/20 2210 02/23/20 0746  GLUCAP 364* 344* 329* 297* 171*   Lipid Profile: No results for input(s): CHOL, HDL, LDLCALC, TRIG, CHOLHDL, LDLDIRECT in the last 72 hours. Thyroid Function Tests: No results for input(s): TSH, T4TOTAL, FREET4, T3FREE, THYROIDAB in the last 72 hours. Anemia Panel: Recent Labs    02/22/20 0446 02/23/20 0433  FERRITIN 500* 397*   Sepsis Labs: Recent Labs  Lab 02/20/20 1316  PROCALCITON <0.10    Recent Results (from the past 240 hour(s))  Culture, blood (Routine X 2) w Reflex to ID Panel     Status: None (Preliminary result)   Collection Time: 02/20/20  2:11 PM   Specimen: BLOOD  Result Value Ref Range Status   Specimen Description BLOOD BLOOD LEFT FOREARM  Final   Special Requests   Final    BOTTLES DRAWN AEROBIC AND ANAEROBIC Blood Culture adequate volume   Culture   Final    NO GROWTH 3 DAYS Performed at [facility], [address],  [place], [place] [address]    Report Status PENDING  Incomplete  Culture, blood (Routine X 2) w Reflex to ID Panel     Status: None (Preliminary result)   Collection Time: 02/20/20  2:11 PM   Specimen: BLOOD  Result Value Ref Range Status   Specimen Description BLOOD BLOOD RIGHT HAND  Final   Special Requests   Final    BOTTLES DRAWN AEROBIC AND ANAEROBIC Blood Culture results may not be optimal due to an inadequate volume of blood received in culture bottles   Culture   Final    NO GROWTH 3 DAYS Performed at [facility], [address] [place], [place] [address]    Report Status PENDING  Incomplete  Resp Panel by RT-PCR (Flu A&B, Covid) Nasopharyngeal Swab     Status: Abnormal   Collection Time: 02/20/20  2:11 PM   Specimen: Nasopharyngeal Swab; Nasopharyngeal(NP) swabs in vial transport medium  Result Value Ref Range Status   SARS Coronavirus 2 by RT PCR POSITIVE (A) NEGATIVE Final    Comment: RESULT CALLED TO, READ BACK BY AND VERIFIED WITH: JANE MARTIN 02/20/20 1517 KLW (NOTE) SARS-CoV-2 target nucleic acids are DETECTED.  The SARS-CoV-2 RNA is generally detectable in upper respiratory specimens during the acute phase of infection. Positive results are indicative of the presence of the identified virus, but do not rule out bacterial infection or co-infection with other pathogens not detected by the test. Clinical correlation with patient history and other diagnostic information is necessary to determine patient infection status. The expected result is Negative.  Fact Sheet for Patients: BloggerCourse.com  Fact Sheet for Healthcare Providers: SeriousBroker.it  This test is not yet approved or cleared by the [place] FDA and  has been authorized for detection and/or diagnosis of SARS-CoV-2 by FDA under an Emergency Use Authorization (EUA).  This EUA will remain in effect (meaning this test can be use d) for the  duration of  the COVID-19 declaration under Section 564(b)(1) of the Act, 21 U.S.C. section 360bbb-3(b)(1), unless the authorization is terminated or revoked sooner.     Influenza A by PCR NEGATIVE NEGATIVE Final   Influenza B by PCR NEGATIVE NEGATIVE Final    Comment: (NOTE) The Xpert Xpress SARS-CoV-2/FLU/RSV plus assay is intended as an aid in the diagnosis of influenza from Nasopharyngeal swab specimens and should not be used as a sole basis for treatment. Nasal washings and aspirates are unacceptable for Xpert  Xpress SARS-CoV-2/FLU/RSV testing.  Fact Sheet for Patients: BloggerCourse.comhttps://www.fda.gov/media/152166/download  Fact Sheet for Healthcare Providers: SeriousBroker.ithttps://www.fda.gov/media/152162/download  This test is not yet approved or cleared by the [place] FDA and has been authorized for detection and/or diagnosis of SARS-CoV-2 by FDA under an Emergency Use Authorization (EUA). This EUA will remain in effect (meaning this test can be used) for the duration of the COVID-19 declaration under Section 564(b)(1) of the Act, 21 U.S.C. section 360bbb-3(b)(1), unless the authorization is terminated or revoked.  Performed at [facility], [address] [place], [place] [address]          Radiology Studies: No results found.      Scheduled Meds: . albuterol  2 puff Inhalation Q6H  . vitamin C  500 mg Oral Daily  . baricitinib  4 mg Oral Daily  . carvedilol  6.25 mg Oral BID  . dexamethasone (DECADRON) injection  6 mg Intravenous Q24H  . enoxaparin (LOVENOX) injection  50 mg Subcutaneous Q24H  . felodipine  10 mg Oral Daily  . insulin aspart  0-15 Units Subcutaneous TID WC  . insulin aspart  12 Units Subcutaneous TID WC  . insulin detemir  55 Units Subcutaneous BID  . losartan  100 mg Oral Daily  . polyethylene glycol  17 g Oral Daily  . potassium chloride  40 mEq Oral Daily  . sodium chloride flush  3 mL Intravenous Q12H  . zinc sulfate  220 mg Oral Daily    Continuous Infusions: . sodium chloride    . remdesivir 100 mg in NS 100 mL 100 mg (02/23/20 1126)     LOS: 3 days    Time spent: 39 minutes spent on chart review, discussion with nursing staff, consultants, updating family and interview/physical exam; more than 50% of that time was spent in counseling and/or coordination of care.    Alvira PhilipsEric J [place], DO [facility] Available via Epic secure chat 7am-7pm After these hours, please refer to coverage provider listed on [email or web address] 02/23/2020, 11:50 AM

## 2020-02-23 NOTE — Progress Notes (Signed)
   02/23/20 1013  Gastrointestinal  Last BM Date 02/17/20 (PTA per pt no BM in week)  Notified MD [place]

## 2020-02-23 NOTE — ED Notes (Signed)
Oxygen titrated down to 12L.

## 2020-02-23 NOTE — Progress Notes (Signed)
Verbal to DC tele from [place] MD

## 2020-02-23 NOTE — Evaluation (Signed)
Physical Therapy Evaluation Patient Details Name: Lee Adkins MRN: [id number] DOB: Feb 21, 1956 Today's Date: 02/23/2020   History of Present Illness  64 year old male with past medical history significant for essential hypertension, type 2 diabetes mellitus who presented to the ED with progressive shortness of breath.  Additionally, patient reports low-grade fever, cough, anorexia and myalgias.  Was tested for Covid-19 and positive on 02/14/2020. Pneumonia due to COVID-19 virus, Acute respiratory failure due to COVID-19 currently on 12 L02  Clinical Impression  PT evaluation completed. Patient is Mod I for bed mobility and supervision for transfers. With standing and marching x 25 reps, patient has desaturation to 84% briefly. With short seated rest break and cues for breathing techniques, Sp02 increases to 90% within 2 minutes. Good dynamic standing balance. Provided patient with education for breathing techniques and exercises to perform while in bed and seated. Recommend to continue PT to maximize independence and address functional limitations.     Follow Up Recommendations Home health PT    Equipment Recommendations  None recommended by PT    Recommendations for Other Services       Precautions / Restrictions Precautions Precautions: Fall Restrictions Weight Bearing Restrictions: No      Mobility  Bed Mobility Overal bed mobility: Modified Independent                  Transfers Overall transfer level: Needs assistance   Transfers: Sit to/from Stand Sit to Stand: Supervision         General transfer comment: supervision for safety  Ambulation/Gait             General Gait Details: unable to ambualte due to high flow oxygen restrictions. patient marched in place x 25 reps each without loss of balance and without UE support  Stairs            Wheelchair Mobility    Modified Rankin (Stroke Patients Only)       Balance Overall balance  assessment: Needs assistance Sitting-balance support: Feet supported Sitting balance-Leahy Scale: Normal     Standing balance support: No upper extremity supported;During functional activity Standing balance-Leahy Scale: Good Standing balance comment: good static and dynamic standing balance                             Pertinent Vitals/Pain Pain Assessment: No/denies pain    Home Living Family/patient expects to be discharged to:: Private residence Living Arrangements: Spouse/significant other Available Help at Discharge: Family Type of Home: House Home Access: Level entry;Stairs to enter   Entrance Stairs-Number of Steps: 4 Home Layout: One level        Prior Function Level of Independence: Independent               Hand Dominance        Extremity/Trunk Assessment   Upper Extremity Assessment Upper Extremity Assessment: Overall WFL for tasks assessed    Lower Extremity Assessment Lower Extremity Assessment: Generalized weakness       Communication   Communication: No difficulties  Cognition Arousal/Alertness: Awake/alert Behavior During Therapy: WFL for tasks assessed/performed Overall Cognitive Status: Within Functional Limits for tasks assessed                                        General Comments      Exercises General Exercises - Lower Extremity  Long Arc Quad: AROM;Strengthening;Both;5 reps;Seated Straight Leg Raises: AROM;Strengthening;Both;5 reps;Supine Hip Flexion/Marching: AROM;Strengthening;Other reps (comment);Standing (25 reps) Other Exercises Other Exercises: verbal and visual cues for technique   Assessment/Plan    PT Assessment Patient needs continued PT services  PT Problem List Decreased strength;Decreased activity tolerance;Decreased balance;Decreased mobility;Cardiopulmonary status limiting activity       PT Treatment Interventions DME instruction;Gait training;Stair training;Functional  mobility training;Therapeutic activities;Balance training;Therapeutic exercise    PT Goals (Current goals can be found in the Care Plan section)  Acute Rehab PT Goals Patient Stated Goal: to go home PT Goal Formulation: With patient Time For Goal Achievement: 03/08/20 Potential to Achieve Goals: Good    Frequency Min 2X/week   Barriers to discharge        Co-evaluation               AM-PAC PT "6 Clicks" Mobility  Outcome Measure Help needed turning from your back to your side while in a flat bed without using bedrails?: None Help needed moving from lying on your back to sitting on the side of a flat bed without using bedrails?: None Help needed moving to and from a bed to a chair (including a wheelchair)?: A Little Help needed standing up from a chair using your arms (e.g., wheelchair or bedside chair)?: A Little Help needed to walk in hospital room?: A Little Help needed climbing 3-5 steps with a railing? : A Little 6 Click Score: 20    End of Session Equipment Utilized During Treatment: Oxygen Activity Tolerance: Patient tolerated treatment well Patient left: in bed;with call bell/phone within reach Nurse Communication: Mobility status PT Visit Diagnosis: Muscle weakness (generalized) (M62.81)    Time: 0277-4128 PT Time Calculation (min) (ACUTE ONLY): 28 min   Charges:   PT Evaluation $PT Eval High Complexity: 1 High PT Treatments $Therapeutic Exercise: 8-22 mins $Therapeutic Activity: 8-22 mins        Donna Bernard, PT, MPT   Ina Homes 02/23/2020, 3:56 PM

## 2020-02-24 LAB — COMPREHENSIVE METABOLIC PANEL
ALT: 39 U/L (ref 0–44)
AST: 31 U/L (ref 15–41)
Albumin: 2.6 g/dL — ABNORMAL LOW (ref 3.5–5.0)
Alkaline Phosphatase: 71 U/L (ref 38–126)
Anion gap: 8 (ref 5–15)
BUN: 24 mg/dL — ABNORMAL HIGH (ref 8–23)
CO2: 31 mmol/L (ref 22–32)
Calcium: 8.8 mg/dL — ABNORMAL LOW (ref 8.9–10.3)
Chloride: 99 mmol/L (ref 98–111)
Creatinine, Ser: 0.78 mg/dL (ref 0.61–1.24)
GFR, Estimated: >60 mL/min (ref >60)
Glucose, Bld: 238 mg/dL — ABNORMAL HIGH (ref 70–99)
Potassium: 4.9 mmol/L (ref 3.5–5.1)
Sodium: 138 mmol/L (ref 135–145)
Total Bilirubin: 0.8 mg/dL (ref 0.3–1.2)
Total Protein: 6.6 g/dL (ref 6.5–8.1)

## 2020-02-24 LAB — CBC WITH DIFFERENTIAL/PLATELET
Abs Immature Granulocytes: 0.13 10*3/uL — ABNORMAL HIGH (ref 0.00–0.07)
Basophils Absolute: 0 10*3/uL (ref 0.0–0.1)
Basophils Relative: 0 %
Eosinophils Absolute: 0 10*3/uL (ref 0.0–0.5)
Eosinophils Relative: 0 %
HCT: 39.7 % (ref 39.0–52.0)
Hemoglobin: 13.3 g/dL (ref 13.0–17.0)
Immature Granulocytes: 2 %
Lymphocytes Relative: 10 %
Lymphs Abs: 0.6 10*3/uL — ABNORMAL LOW (ref 0.7–4.0)
MCH: 27.9 pg (ref 26.0–34.0)
MCHC: 33.5 g/dL (ref 30.0–36.0)
MCV: 83.2 fL (ref 80.0–100.0)
Monocytes Absolute: 0.4 10*3/uL (ref 0.1–1.0)
Monocytes Relative: 6 %
Neutro Abs: 5.1 10*3/uL (ref 1.7–7.7)
Neutrophils Relative %: 82 %
Platelets: 233 10*3/uL (ref 150–400)
RBC: 4.77 MIL/uL (ref 4.22–5.81)
RDW: 12.4 % (ref 11.5–15.5)
WBC: 6.3 10*3/uL (ref 4.0–10.5)
nRBC: 0 % (ref 0.0–0.2)

## 2020-02-24 LAB — GLUCOSE, CAPILLARY
Glucose-Capillary: 137 mg/dL — ABNORMAL HIGH (ref 70–99)
Glucose-Capillary: 222 mg/dL — ABNORMAL HIGH (ref 70–99)
Glucose-Capillary: 165 mg/dL — ABNORMAL HIGH (ref 70–99)
Glucose-Capillary: 228 mg/dL — ABNORMAL HIGH (ref 70–99)
Glucose-Capillary: 241 mg/dL — ABNORMAL HIGH (ref 70–99)

## 2020-02-24 LAB — FERRITIN: Ferritin: 350 ng/mL — ABNORMAL HIGH (ref 24–336)

## 2020-02-24 LAB — MAGNESIUM: Magnesium: 2.1 mg/dL (ref 1.7–2.4)

## 2020-02-24 LAB — FIBRIN DERIVATIVES D-DIMER (ARMC ONLY): Fibrin derivatives D-dimer (ARMC): 460.18 ng/mL (FEU) (ref 0.00–499.00)

## 2020-02-24 LAB — C-REACTIVE PROTEIN: CRP: 0.8 mg/dL (ref <1.0)

## 2020-02-24 LAB — HEMOGLOBIN A1C
Hgb A1c MFr Bld: 11.2 % — ABNORMAL HIGH (ref 4.8–5.6)
Mean Plasma Glucose: 274.74 mg/dL

## 2020-02-24 LAB — PHOSPHORUS: Phosphorus: 4.1 mg/dL (ref 2.5–4.6)

## 2020-02-24 MED ORDER — INSULIN DETEMIR 100 UNIT/ML ~~LOC~~ SOLN
60.0000 [IU] | Freq: Two times a day (BID) | SUBCUTANEOUS | Status: DC
  Administered 2020-02-24: 60 [IU] via SUBCUTANEOUS
  Filled 2020-02-24 (×2): qty 0.6

## 2020-02-24 MED ORDER — INSULIN ASPART 100 UNIT/ML ~~LOC~~ SOLN
15.0000 [IU] | Freq: Three times a day (TID) | SUBCUTANEOUS | Status: DC
  Administered 2020-02-24 – 2020-02-25 (×5): 15 [IU] via SUBCUTANEOUS
  Filled 2020-02-24 (×5): qty 1

## 2020-02-24 MED ORDER — HYDROCHLOROTHIAZIDE 25 MG PO TABS
25.0000 mg | ORAL_TABLET | Freq: Every day | ORAL | Status: DC
Start: 2020-02-24 — End: 2020-02-26
  Administered 2020-02-24 – 2020-02-26 (×3): 25 mg via ORAL
  Filled 2020-02-24 (×3): qty 1

## 2020-02-24 MED ORDER — POLYETHYLENE GLYCOL 3350 17 G PO PACK: 17.0000 g | PACK | Freq: Every day | ORAL | Status: DC | PRN

## 2020-02-24 NOTE — Progress Notes (Signed)
PROGRESS NOTE    [name]  BWG:[id number] DOB: [birth year] DOA: 02/20/2020 PCP: Abram Sander, MD    Brief Narrative:  Lee Adkins is a 64 year old male with past medical history significant for essential hypertension, type 2 diabetes mellitus who presented to the ED with progressive shortness of breath.  Additionally, patient reports low-grade fever, cough, anorexia and myalgias.  Was tested for Covid-19 and positive on 02/14/2020.  In the ED, SPO2 was noted to be 87% on room air, with increased respiratory rate of 30.  Sodium 128, potassium 3.2, chloride 88, bicarb 26, glucose 318, BUN 17, creatinine 0.97, procalcitonin less than 0.10, DBC 5.4, hemoglobin 14.7, platelet count 204.  Chest x-ray with multifocal pneumonia consistent with Covid-19 viral infection.  Hospital service consulted for further evaluation, treatment and management of acute hypoxic respiratory failure secondary to Covid-19 viral pneumonia.   Assessment & Plan:   Principal Problem:   Pneumonia due to COVID-19 virus Active Problems:   Acute respiratory failure due to COVID-19 [facility])   Diabetes mellitus with hyperglycemia (HCC)   Hyponatremia   Hypokalemia   Acute hypoxemic respiratory failure (HCC)  Acute hypoxic respiratory failure secondary to acute Covid-19 viral pneumonia during the ongoing 2020/2021 Covid 19 Pandemic - POA Patient presenting to ED with progressive shortness of breath, diagnosed with Covid-19 on 02/14/2020.  Patient was noted to be hypoxic on presentation with SPO2 87% room air.  Chest x-ray consistent with left leg focal pneumonia. --COVID test: outside test reported + on 12/10, PCR positive on 12/16 --CRP 9.5>5.1>1.5>pending this am --ddimer 552>530>505>460 --Remdesivir, plan 5-day course (Day #5/5) --Baricitinib 4mg  PO daily (Day #2/14) --Solumedrol 40mg  IV q8h --prone for 2-3hrs every 12hrs if able --Continue supplemental oxygen, titrate to maintain SPO2 greater than 92%,  on 12L HFNC w/ spO2 98% --Continue supportive care with albuterol MDI prn, vitamin C, zinc, Tylenol, antitussives (benzonatate/ Mucinex/Tussionex) --Follow CBC, CMP, D-dimer, and CRP daily --Continue airborne/contact isolation precautions for 3 weeks from the day of diagnosis  The treatment plan and use of medications and known side effects were discussed with patient/family. Some of the medications used are based on case reports/anecdotal data.  All other medications being used in the management of COVID-19 based on limited study data.  Complete risks and long-term side effects are unknown, however in the best clinical judgment they seem to be of some benefit.  Patient wanted to proceed with treatment options provided.  Essential hypertension BP 151/76 this morning --Carvedilol 6.25 mg twice daily --Felodipine 10 mg p.o. daily --Losartan 100 mg p.o. daily  Type 2 diabetes mellitus Home medications include Jardiance 10 mg p.o. daily, Metformin 1500 mg p.o. daily, Trulicity 1.5 mg once weekly.  Hemoglobin A1c 11.2, poorly controlled. --Levemir 60 units [place] BID --NovoLog 15 units [place] TIDAC --ISS for further coverage --continue to monitor CBGs qAC/HS   DVT prophylaxis: Lovenox Code Status: Full code Family Communication: Updated patient extensively at bedside  Disposition Plan:  Status is: Inpatient  Remains inpatient appropriate because:Ongoing diagnostic testing needed not appropriate for outpatient work up, Unsafe d/c plan, IV treatments appropriate due to intensity of illness or inability to take PO and Inpatient level of care appropriate due to severity of illness   Dispo: The patient is from: Home              Anticipated d/c is to: Home              Anticipated d/c date is: 3 days  Patient currently is not medically stable to d/c.   Consultants:   None  Procedures:   None  Antimicrobials:   None   Subjective: Patient seen and examined bedside, resting  comfortably.  Continues with significant shortness of breath especially with minimal exertion.  Oxygen now titrated down to 12 L high flow nasal cannula.  No other complaints or concerns at this time. Denies headache, no visual changes, no chest pain, palpitations, no abdominal pain, no paresthesias.  No acute events overnight per nursing staff.  Objective: Vitals:   02/23/20 1950 02/24/20 0009 02/24/20 0346 02/24/20 0800  BP: 125/68 (!) 141/77 (!) 151/76 (!) 165/75  Pulse: 70 (!) 59 (!) 58 62  Resp: 16 18 16 15   Temp: 98.4 F (36.9 C) 97.7 F (36.5 C) 97.9 F (36.6 C) 97.6 F (36.4 C)  TempSrc: Oral Oral    SpO2: 97% 100% 98% 95%  Weight:      Height:       No intake or output data in the 24 hours ending 02/24/20 1038 Filed Weights   02/20/20 1302  Weight: 102.1 kg    Examination:  General exam: Appears calm and comfortable  Respiratory system: Clear to auscultation. Respiratory effort normal.  On 12 L HFNC w/ SpO2 98% Cardiovascular system: S1 & S2 heard, RRR. No JVD, murmurs, rubs, gallops or clicks. No pedal edema. Gastrointestinal system: Abdomen is nondistended, soft and nontender. No organomegaly or masses felt. Normal bowel sounds heard. Central nervous system: Alert and oriented. No focal neurological deficits. Extremities: Symmetric 5 x 5 power. Skin: No rashes, lesions or ulcers Psychiatry: Judgement and insight appear normal. Mood & affect appropriate.     Data Reviewed: I have personally reviewed following labs and imaging studies  CBC: Recent Labs  Lab 02/20/20 1316 02/21/20 0422 02/22/20 0446 02/23/20 0433 02/24/20 0431  WBC 5.4 3.3* 6.8 7.0 6.3  NEUTROABS  --  2.4 5.4 5.6 5.1  HGB 14.7 13.0 14.7 13.3 13.3  HCT 42.8 38.7* 44.2 38.6* 39.7  MCV 81.4 82.3 83.1 82.5 83.2  PLT 204 206 269 229 233   Basic Metabolic Panel: Recent Labs  Lab 02/20/20 1316 02/21/20 0422 02/22/20 0446 02/23/20 0433 02/24/20 0431  NA 128* 131* 136 134* 138  K 3.2* 3.7  4.1 3.8 4.9  CL 88* 92* 93* 99 99  CO2 26 26 29 24 31   GLUCOSE 318* 446* 402* 254* 238*  BUN 17 23 29* 23 24*  CREATININE 0.97 1.10 1.01 0.69 0.78  CALCIUM 8.5* 8.5* 9.5 8.7* 8.8*  MG  --  2.2 2.2 1.9 2.1  PHOS  --  3.0 5.2* 3.8 4.1   GFR: Estimated Creatinine Clearance: 110.9 mL/min (by C-G formula based on SCr of 0.78 mg/dL). Liver Function Tests: Recent Labs  Lab 02/21/20 0422 02/22/20 0446 02/23/20 0433 02/24/20 0431  AST 26 19 20 31   ALT 24 25 23  39  ALKPHOS 78 91 68 71  BILITOT 0.9 0.8 0.8 0.8  PROT 7.1 7.9 6.6 6.6  ALBUMIN 2.7* 2.9* 2.6* 2.6*   No results for input(s): LIPASE, AMYLASE in the last 168 hours. No results for input(s): AMMONIA in the last 168 hours. Coagulation Profile: No results for input(s): INR, PROTIME in the last 168 hours. Cardiac Enzymes: No results for input(s): CKTOTAL, CKMB, CKMBINDEX, TROPONINI in the last 168 hours. BNP (last 3 results) No results for input(s): PROBNP in the last 8760 hours. HbA1C: Recent Labs    02/24/20 0431  HGBA1C 11.2*  CBG: Recent Labs  Lab 02/23/20 0746 02/23/20 1158 02/23/20 1553 02/23/20 1953 02/24/20 0759  GLUCAP 171* 217* 232* 358* 222*   Lipid Profile: No results for input(s): CHOL, HDL, LDLCALC, TRIG, CHOLHDL, LDLDIRECT in the last 72 hours. Thyroid Function Tests: No results for input(s): TSH, T4TOTAL, FREET4, T3FREE, THYROIDAB in the last 72 hours. Anemia Panel: Recent Labs    02/23/20 0433 02/24/20 0431  FERRITIN 397* 350*   Sepsis Labs: Recent Labs  Lab 02/20/20 1316  PROCALCITON <0.10    Recent Results (from the past 240 hour(s))  Culture, blood (Routine X 2) w Reflex to ID Panel     Status: None (Preliminary result)   Collection Time: 02/20/20  2:11 PM   Specimen: BLOOD  Result Value Ref Range Status   Specimen Description BLOOD BLOOD LEFT FOREARM  Final   Special Requests   Final    BOTTLES DRAWN AEROBIC AND ANAEROBIC Blood Culture adequate volume   Culture   Final    NO  GROWTH 3 DAYS Performed at [facility], [address] [place], [place] [address]    Report Status PENDING  Incomplete  Culture, blood (Routine X 2) w Reflex to ID Panel     Status: None (Preliminary result)   Collection Time: 02/20/20  2:11 PM   Specimen: BLOOD  Result Value Ref Range Status   Specimen Description BLOOD BLOOD RIGHT HAND  Final   Special Requests   Final    BOTTLES DRAWN AEROBIC AND ANAEROBIC Blood Culture results may not be optimal due to an inadequate volume of blood received in culture bottles   Culture   Final    NO GROWTH 3 DAYS Performed at [facility], [address] [place], [place] [address]    Report Status PENDING  Incomplete  Resp Panel by RT-PCR (Flu A&B, Covid) Nasopharyngeal Swab     Status: Abnormal   Collection Time: 02/20/20  2:11 PM   Specimen: Nasopharyngeal Swab; Nasopharyngeal(NP) swabs in vial transport medium  Result Value Ref Range Status   SARS Coronavirus 2 by RT PCR POSITIVE (A) NEGATIVE Final    Comment: RESULT CALLED TO, READ BACK BY AND VERIFIED WITH: JANE MARTIN 02/20/20 1517 KLW (NOTE) SARS-CoV-2 target nucleic acids are DETECTED.  The SARS-CoV-2 RNA is generally detectable in upper respiratory specimens during the acute phase of infection. Positive results are indicative of the presence of the identified virus, but do not rule out bacterial infection or co-infection with other pathogens not detected by the test. Clinical correlation with patient history and other diagnostic information is necessary to determine patient infection status. The expected result is Negative.  Fact Sheet for Patients: BloggerCourse.comhttps://www.fda.gov/media/152166/download  Fact Sheet for Healthcare Providers: SeriousBroker.ithttps://www.fda.gov/media/152162/download  This test is not yet approved or cleared by the [place] FDA and  has been authorized for detection and/or diagnosis of SARS-CoV-2 by FDA under an Emergency Use Authorization (EUA).   This EUA will remain in effect (meaning this test can be use d) for the duration of  the COVID-19 declaration under Section 564(b)(1) of the Act, 21 U.S.C. section 360bbb-3(b)(1), unless the authorization is terminated or revoked sooner.     Influenza A by PCR NEGATIVE NEGATIVE Final   Influenza B by PCR NEGATIVE NEGATIVE Final    Comment: (NOTE) The Xpert Xpress SARS-CoV-2/FLU/RSV plus assay is intended as an aid in the diagnosis of influenza from Nasopharyngeal swab specimens and should not be used as a sole basis for treatment. Nasal washings and aspirates  are unacceptable for Xpert Xpress SARS-CoV-2/FLU/RSV testing.  Fact Sheet for Patients: BloggerCourse.com  Fact Sheet for Healthcare Providers: SeriousBroker.it  This test is not yet approved or cleared by the [place] FDA and has been authorized for detection and/or diagnosis of SARS-CoV-2 by FDA under an Emergency Use Authorization (EUA). This EUA will remain in effect (meaning this test can be used) for the duration of the COVID-19 declaration under Section 564(b)(1) of the Act, 21 U.S.C. section 360bbb-3(b)(1), unless the authorization is terminated or revoked.  Performed at [facility], [address] [place], [place] [address]          Radiology Studies: No results found.      Scheduled Meds: . albuterol  2 puff Inhalation Q6H  . vitamin C  500 mg Oral Daily  . baricitinib  4 mg Oral Daily  . carvedilol  6.25 mg Oral BID  . enoxaparin (LOVENOX) injection  50 mg Subcutaneous Q24H  . felodipine  10 mg Oral Daily  . insulin aspart  0-15 Units Subcutaneous TID WC  . insulin aspart  12 Units Subcutaneous TID WC  . insulin detemir  55 Units Subcutaneous BID  . losartan  100 mg Oral Daily  . methylPREDNISolone (SOLU-MEDROL) injection  40 mg Intravenous Q8H  . polyethylene glycol  17 g Oral Daily  . potassium chloride  40 mEq Oral Daily  .  sodium chloride flush  3 mL Intravenous Q12H  . zinc sulfate  220 mg Oral Daily   Continuous Infusions: . sodium chloride       LOS: 4 days    Time spent: 38 minutes spent on chart review, discussion with nursing staff, consultants, updating family and interview/physical exam; more than 50% of that time was spent in counseling and/or coordination of care.    Alvira Philips [place], DO [facility] Available via Epic secure chat 7am-7pm After these hours, please refer to coverage provider listed on [email or web address] 02/24/2020, 10:38 AM

## 2020-02-24 NOTE — TOC Initial Note (Signed)
Transition of Care [facility]) - Initial/Assessment Note    Patient Details  Name: Lee Adkins MRN: [id number] Date of Birth: 1955/07/30  [facility] CM/SW Contact:    Shelbie Hutching, RN Phone Number: 02/24/2020, 1:14 PM  Clinical Narrative:                 Patient admitted to the hospital with COVID requiring supplemental oxygen currently at 11L HFNC.  RNCM met with patient at the bedside.  Patient reports that he is from home with his wife and he is independent and very active [place].  Patient is current with his PCP at [facility] and he also gets his prescriptions filled there.  PT is recommending home health services.  Patient agrees to home health.  [facility] is not in network with insurance and [facility] is checking to see if they can accept patient for services.   TOC team will cont to follow.   Expected Discharge Plan: [place] Barriers to Discharge: Continued Medical Work up   Patient Goals and CMS Choice Patient states their goals for this hospitalization and ongoing recovery are:: to get home CMS Medicare.gov Compare Post Acute Care list provided to:: Patient Choice offered to / list presented to : Patient  Expected Discharge Plan and Services Expected Discharge Plan: [place]   Discharge Planning Services: CM Consult Post Acute Care Choice: Onset arrangements for the past 2 months: Single Family Home                           HH Arranged: PT          Prior Living Arrangements/Services Living arrangements for the past 2 months: Single Family Home Lives with:: Spouse Patient language and need for interpreter reviewed:: Yes Do you feel safe going back to the place where you live?: Yes      Need for Family Participation in Patient Care: Yes (Comment) (COVID will likely DC with oxygen) Care giver support system in place?: Yes (comment) (wife)   Criminal Activity/Legal  Involvement Pertinent to Current Situation/Hospitalization: No - Comment as needed  Activities of Daily Living Home Assistive Devices/Equipment: None ADL Screening (condition at time of admission) Patient's cognitive ability adequate to safely complete daily activities?: Yes Is the patient deaf or have difficulty hearing?: No Does the patient have difficulty seeing, even when wearing glasses/contacts?: No Does the patient have difficulty concentrating, remembering, or making decisions?: No Patient able to express need for assistance with ADLs?: Yes Does the patient have difficulty dressing or bathing?: No Independently performs ADLs?: Yes (appropriate for developmental age) Does the patient have difficulty walking or climbing stairs?: No Weakness of Legs: None Weakness of Arms/Hands: None  Permission Sought/Granted Permission sought to share information with : Case Manager,Family Supports,Other (comment) Permission granted to share information with : Yes, Verbal Permission Granted  Share Information with NAME: Heidi  Permission granted to share info w AGENCY: Home Health agencies  Permission granted to share info w Relationship: wife     Emotional Assessment Appearance:: Appears stated age Attitude/Demeanor/Rapport: Engaged Affect (typically observed): Accepting Orientation: : Oriented to Self,Oriented to Place,Oriented to  Time,Oriented to Situation Alcohol / Substance Use: Not Applicable Psych Involvement: No (comment)  Admission diagnosis:  Hypoxia [R09.02] Acute hypoxemic respiratory failure ([place]) [J96.01] Pneumonia due to COVID-19 virus [U07.1, J12.82] Suspected COVID-19 virus infection [Z20.822] Patient Active Problem List  Diagnosis Date Noted  . Acute hypoxemic respiratory failure ([place]) 02/22/2020  . Pneumonia due to COVID-19 virus 02/20/2020  . Acute respiratory failure due to COVID-19 ([place]) 02/20/2020  . Diabetes mellitus with hyperglycemia ([place]) 02/20/2020  .  Hyponatremia 02/20/2020  . Hypokalemia 02/20/2020  . Type II or unspecified type diabetes mellitus without mention of complication, uncontrolled 10/25/2013  . OSA on CPAP 10/25/2013  . Umbilical hernia 78/47/8412   PCP:  Frazier Richards, MD Pharmacy:   [facility], [place] [address] [place] [address] Phone: [phone] Fax: [phone]     Social Determinants of Health (SDOH) Interventions    Readmission Risk Interventions No flowsheet data found.

## 2020-02-25 LAB — CBC WITH DIFFERENTIAL/PLATELET
Abs Immature Granulocytes: 0.1 10*3/uL — ABNORMAL HIGH (ref 0.00–0.07)
Basophils Absolute: 0 10*3/uL (ref 0.0–0.1)
Basophils Relative: 0 %
Eosinophils Absolute: 0 10*3/uL (ref 0.0–0.5)
Eosinophils Relative: 0 %
HCT: 40.6 % (ref 39.0–52.0)
Hemoglobin: 14 g/dL (ref 13.0–17.0)
Immature Granulocytes: 2 %
Lymphocytes Relative: 10 %
Lymphs Abs: 0.7 10*3/uL (ref 0.7–4.0)
MCH: 28.1 pg (ref 26.0–34.0)
MCHC: 34.5 g/dL (ref 30.0–36.0)
MCV: 81.4 fL (ref 80.0–100.0)
Monocytes Absolute: 0.3 10*3/uL (ref 0.1–1.0)
Monocytes Relative: 5 %
Neutro Abs: 5.7 10*3/uL (ref 1.7–7.7)
Neutrophils Relative %: 83 %
Platelets: 242 10*3/uL (ref 150–400)
RBC: 4.99 MIL/uL (ref 4.22–5.81)
RDW: 12.6 % (ref 11.5–15.5)
WBC: 6.8 10*3/uL (ref 4.0–10.5)
nRBC: 0 % (ref 0.0–0.2)

## 2020-02-25 LAB — COMPREHENSIVE METABOLIC PANEL
ALT: 45 U/L — ABNORMAL HIGH (ref 0–44)
AST: 29 U/L (ref 15–41)
Albumin: 2.7 g/dL — ABNORMAL LOW (ref 3.5–5.0)
Alkaline Phosphatase: 65 U/L (ref 38–126)
Anion gap: 9 (ref 5–15)
BUN: 24 mg/dL — ABNORMAL HIGH (ref 8–23)
CO2: 29 mmol/L (ref 22–32)
Calcium: 9.1 mg/dL (ref 8.9–10.3)
Chloride: 99 mmol/L (ref 98–111)
Creatinine, Ser: 0.79 mg/dL (ref 0.61–1.24)
GFR, Estimated: >60 mL/min (ref >60)
Glucose, Bld: 198 mg/dL — ABNORMAL HIGH (ref 70–99)
Potassium: 4.5 mmol/L (ref 3.5–5.1)
Sodium: 137 mmol/L (ref 135–145)
Total Bilirubin: 0.8 mg/dL (ref 0.3–1.2)
Total Protein: 6.6 g/dL (ref 6.5–8.1)

## 2020-02-25 LAB — CULTURE, BLOOD (ROUTINE X 2)
Culture: NO GROWTH
Culture: NO GROWTH
Special Requests: ADEQUATE

## 2020-02-25 LAB — FIBRIN DERIVATIVES D-DIMER (ARMC ONLY): Fibrin derivatives D-dimer (ARMC): 412.71 ng/mL (FEU) (ref 0.00–499.00)

## 2020-02-25 LAB — GLUCOSE, CAPILLARY
Glucose-Capillary: 237 mg/dL — ABNORMAL HIGH (ref 70–99)
Glucose-Capillary: 244 mg/dL — ABNORMAL HIGH (ref 70–99)
Glucose-Capillary: 138 mg/dL — ABNORMAL HIGH (ref 70–99)
Glucose-Capillary: 138 mg/dL — ABNORMAL HIGH (ref 70–99)

## 2020-02-25 LAB — C-REACTIVE PROTEIN: CRP: 0.6 mg/dL (ref <1.0)

## 2020-02-25 MED ORDER — INSULIN STARTER KIT- PEN NEEDLES (ENGLISH)
1.0000 | Freq: Once | Status: AC
  Administered 2020-02-25: 17:00:00 1
  Filled 2020-02-25 (×2): qty 1

## 2020-02-25 MED ORDER — METHYLPREDNISOLONE SODIUM SUCC 40 MG IJ SOLR
40.0000 mg | Freq: Two times a day (BID) | INTRAMUSCULAR | Status: DC
  Administered 2020-02-25 – 2020-02-26 (×2): 40 mg via INTRAVENOUS
  Filled 2020-02-25 (×2): qty 1

## 2020-02-25 MED ORDER — INSULIN DETEMIR 100 UNIT/ML ~~LOC~~ SOLN
65.0000 [IU] | Freq: Two times a day (BID) | SUBCUTANEOUS | Status: DC
  Administered 2020-02-25 (×2): 65 [IU] via SUBCUTANEOUS
  Filled 2020-02-25 (×3): qty 0.65

## 2020-02-25 NOTE — Discharge Instructions (Signed)
Fingerstick glucose (sugar) goals for home: Before meals: 80-130 mg/dl 2-Hours after meals: less than 180 mg/dl Hemoglobin X2J goal: 7% or less   Insulin Injection Instructions, Using Insulin Pens, Adult A subcutaneous injection is a shot of medicine that is injected into the layer of fat and tissue between skin and muscle. People with type 1 diabetes must take insulin because their bodies do not make it. People with type 2 diabetes may need to take insulin. There are many different types of insulin. The type of insulin that you take may determine how many injections you give yourself and when you need to give the injections. Supplies needed:  Soap and water to wash hands.  Your insulin pen.  A new, unused needle.  Alcohol wipes.  A disposal container that is meant for sharp items (sharps container), such as an empty plastic bottle with a cover. How to choose a site for injection  The body absorbs insulin differently, depending on where the insulin is injected (injection site). It is best to inject insulin into the same body area each time (for example, always in the abdomen), but you should use a different spot in that area for each injection. Do not inject the insulin in the same spot each time. There are five main areas that can be used for injecting. These areas include:  Abdomen. This is the preferred area.  Front of thigh.  Upper, outer side of thigh.  Upper, outer side of arm.  Upper, outer part of buttock. How to use an insulin pen   First, follow the steps for Get ready, then continue with the steps for Inject the insulin. Get ready 1. Wash your hands with soap and water. If soap and water are not available, use hand sanitizer. 2. Before you give yourself an insulin injection, be sure to test your blood sugar level (blood glucose level) and write down that number. Follow any instructions from your health care provider about what to do if your blood glucose level is  higher or lower than your normal range. 3. Check the expiration date and the type of insulin that is in the pen. 4. If you are using CLEAR insulin, check to see that it is clear and free of clumps. 5. If you are using CLOUDY insulin, do not shake the pen to get the injection ready. Instead, get it ready in one of these ways: ? Gently roll the pen between your palms several times. ? Tip the pen up and down several times. 6. Remove the cap from the insulin pen. 7. Use an alcohol wipe to clean the rubber tip of the pen. 8. Remove the protective paper tab from the disposable needle. Do not let the needle touch anything. 9. Screw a new, unused needle onto the pen. 10. Remove the outer plastic needle cover. Do not throw away the outer plastic cover yet. ? If the pen uses a special safety needle, leave the inner needle shield in place. ? If the pen does not use a special safety needle, remove the inner plastic cover from the needle. 11. Follow the manufacturer's instructions to prime the insulin pen with the volume of insulin needed. Hold the pen with the needle pointing up, and push the button on the opposite end of the pen until a drop of insulin appears at the needle tip. If no insulin appears, repeat this step. 12. Turn the button (dial) to the number of units of insulin that you will be injecting. Inject the  insulin 1. Use an alcohol wipe to clean the site where you will be injecting the needle. Let the site air-dry. 2. Hold the pen in the palm of your writing hand like a pencil. 3. If directed by your health care provider, use your other hand to pinch and hold about an inch (2.5 cm) of skin at the injection site. Do not directly touch the cleaned part of the skin. 4. Gently but quickly, use your writing hand to put the needle straight into the skin. The needle should be at a 90-degree angle (perpendicular) to the skin. 5. When the needle is completely inserted into the skin, use your thumb or  index finger of your writing hand to push the top button of the pen down all the way to inject the insulin. 6. Let go of the skin that you are pinching. Continue to hold the pen in place with your writing hand. 7. Wait 10 seconds, then pull the needle straight out of the skin. This will allow all of the insulin to go from the pen and needle into your body. 8. Carefully put the larger (outer) plastic cover of the needle back over the needle, then unscrew the capped needle and discard it in a sharps container, such as an empty plastic bottle with a cover. 9. Put the plastic cap back on the insulin pen. How to throw away supplies  Discard all used needles in a puncture-proof sharps disposal container. You can ask your local pharmacy about where you can get this kind of disposal container, or you can use an empty plastic liquid laundry detergent bottle that has a cover.  Follow the disposal regulations for the area where you live. Do not use any needle more than one time.  Throw away empty disposable pens in the regular trash. Questions to ask your health care provider  How often should I be taking insulin?  How often should I check my blood glucose?  What amount of insulin should I be taking at each time?  What are the side effects?  What should I do if my blood glucose is too high?  What should I do if my blood glucose is too low?  What should I do if I forget to take my insulin?  What number should I call if I have questions? Where to find more information  American Diabetes Association (ADA): www.diabetes.org  American Association of Diabetes Educators (AADE) Patient Resources: https://www.diabeteseducator.org Summary  A subcutaneous injection is a shot of medicine that is injected into the layer of fat and tissue between skin and muscle.  Before you give yourself an insulin injection, be sure to test your blood sugar level (blood glucose level) and write down that  number.  Check the expiration date and the type of insulin that is in the pen. The type of insulin that you take may determine how many injections you give yourself and when you need to give the injections.  It is best to inject insulin into the same body area each time (for example, always in the abdomen), but you should use a different spot in that area for each injection. This information is not intended to replace advice given to you by your health care provider. Make sure you discuss any questions you have with your health care provider. Document Revised: 03/13/2017 Document Reviewed: 03/27/2015 Elsevier Patient Education  2020 [employer].

## 2020-02-25 NOTE — TOC Progression Note (Addendum)
Transition of Care [facility]) - Progression Note    Patient Details  Name: LONG BRIMAGE MRN: [id number] Date of Birth: 1955-07-03  Transition of Care [facility]) CM/SW Contact  Allayne Butcher, RN Phone Number: 02/25/2020, 2:21 PM  Clinical Narrative:    Plan for discharge tomorrow with home health PT and home oxygen.  [facility] agrees to accept patient for PT but they cannot see patient until next week.  RNCM will make patient aware.  Home oxygen will be provided by [employer].  Referral for oxygen given to [facility] with [employer].  Oxygen should be delivered this afternoon so patient will have it for discharge in the morning.    Patient updated on discharge plan for tomorrow.  Patient states he has a pulse oximeter at home.  Patient has no questions at this time.    Expected Discharge Plan: Home w Home Health Services Barriers to Discharge: Continued Medical Work up  Expected Discharge Plan and Services Expected Discharge Plan: Home w Home Health Services   Discharge Planning Services: CM Consult Post Acute Care Choice: Home Health Living arrangements for the past 2 months: Single Family Home                 DME Arranged: Oxygen DME Agency: [employer] Date DME Agency Contacted: 02/25/20 Time DME Agency Contacted: 1421 Representative spoke with at DME Agency: Ian Malkin HH Arranged: PT HH Agency: [facility] Date [facility] Agency Contacted: 02/25/20 Time HH Agency Contacted: 1421 Representative spoke with at [facility] Agency: Kandee Keen   Social Determinants of Health (SDOH) Interventions    Readmission Risk Interventions No flowsheet data found.

## 2020-02-25 NOTE — Progress Notes (Signed)
PROGRESS NOTE    Lee Adkins  CVE:[id number] DOB: May 01, 1955 DOA: 02/20/2020 PCP: Abram Sander, MD    Brief Narrative:  Lee Adkins is a 64 year old male with past medical history significant for essential hypertension, type 2 diabetes mellitus who presented to the ED with progressive shortness of breath.  Additionally, patient reports low-grade fever, cough, anorexia and myalgias.  Was tested for Covid-19 and positive on 02/14/2020.  In the ED, SPO2 was noted to be 87% on room air, with increased respiratory rate of 30.  Sodium 128, potassium 3.2, chloride 88, bicarb 26, glucose 318, BUN 17, creatinine 0.97, procalcitonin less than 0.10, DBC 5.4, hemoglobin 14.7, platelet count 204.  Chest x-ray with multifocal pneumonia consistent with Covid-19 viral infection.  Hospital service consulted for further evaluation, treatment and management of acute hypoxic respiratory failure secondary to Covid-19 viral pneumonia.   Assessment & Plan:   Principal Problem:   Pneumonia due to COVID-19 virus Active Problems:   Acute respiratory failure due to COVID-19 [facility])   Diabetes mellitus with hyperglycemia (HCC)   Hyponatremia   Hypokalemia   Acute hypoxemic respiratory failure (HCC)  Acute hypoxic respiratory failure secondary to acute Covid-19 viral pneumonia during the ongoing 2020/2021 Covid 19 Pandemic - POA Patient presenting to ED with progressive shortness of breath, diagnosed with Covid-19 on 02/14/2020.  Patient was noted to be hypoxic on presentation with SPO2 87% room air.  Chest x-ray consistent with left leg focal pneumonia. --COVID test: outside test reported + on 12/10, PCR positive on 12/16 --CRP 9.5>5.1>1.5>0.8>pending this am --ddimer 552>[id number]>460>412 --Completed 5-day course of remdesivir 02/24/2020 --Baricitinib 4mg  PO daily (Day #3/14) --Solumedrol 40mg  IV q12h --prone for 2-3hrs every 12hrs if able --Continue supplemental oxygen, titrate to maintain SPO2  greater than 92%, on 11L HFNC w/ spO2 98%; turned down to 4 L nasal cannula in room with maintenance of SPO2 97-98% --Ambulatory O2 screening to assess O2 needs today --Continue supportive care with albuterol MDI prn, vitamin C, zinc, Tylenol, antitussives (benzonatate/ Mucinex/Tussionex) --Follow CBC, CMP, D-dimer, and CRP daily --Continue airborne/contact isolation precautions for 3 weeks from the day of diagnosis  The treatment plan and use of medications and known side effects were discussed with patient/family. Some of the medications used are based on case reports/anecdotal data.  All other medications being used in the management of COVID-19 based on limited study data.  Complete risks and long-term side effects are unknown, however in the best clinical judgment they seem to be of some benefit.  Patient wanted to proceed with treatment options provided.  Essential hypertension BP 130/72 this morning --Carvedilol 6.25 mg twice daily --Felodipine 10 mg p.o. daily --Losartan 100 mg p.o. daily  Type 2 diabetes mellitus Home medications include Jardiance 10 mg p.o. daily, Metformin 1500 mg p.o. daily, Trulicity 1.5 mg once weekly.  Hemoglobin A1c 11.2, poorly controlled. --Levemir 65 units [place] BID --NovoLog 15 units [place] TIDAC --ISS for further coverage --continue to monitor CBGs qAC/HS   DVT prophylaxis: Lovenox Code Status: Full code Family Communication: Updated patient extensively at bedside  Disposition Plan:  Status is: Inpatient  Remains inpatient appropriate because:Ongoing diagnostic testing needed not appropriate for outpatient work up, Unsafe d/c plan, IV treatments appropriate due to intensity of illness or inability to take PO and Inpatient level of care appropriate due to severity of illness   Dispo: The patient is from: Home              Anticipated d/c is to: Home  Anticipated d/c date is: 1 day              Patient currently is not medically stable to  d/c.  Awaiting ambulatory O2 screening, if remains on 6 L nasal cannula or less, anticipate discharge home on 02/26/2020   Consultants:   None  Procedures:   None  Antimicrobials:   None   Subjective: Patient seen and examined bedside, resting comfortably.  Sitting at edge of bed eating breakfast.  Continues on 11 L high flow nasal cannula with SPO2 98%; oxygen turned down to 4 L nasal cannula during visit this morning with maintenance of SPO2 between 97-98%.  Shortness of breath improved, worsens with minimal exertion.  Discussed with patient will test his ambulatory O2 saturations to see if we can further titrate down his oxygen today.  No other complaints or concerns at this time.  Denies headache, no visual changes, no chest pain, palpitations, no abdominal pain, no paresthesias.  No acute events overnight per nursing staff.  Objective: Vitals:   02/24/20 1632 02/24/20 2104 02/25/20 0027 02/25/20 0913  BP: 136/69 131/78 130/72 (!) 133/54  Pulse: 65 74 84 66  Resp: 16 17 18 16   Temp: 98.9 F (37.2 C) 98 F (36.7 C) 98.2 F (36.8 C) 98.4 F (36.9 C)  TempSrc:      SpO2: 98% 98% 99% 98%  Weight:      Height:        Intake/Output Summary (Last 24 hours) at 02/25/2020 1022 Last data filed at 02/25/2020 0946 Gross per 24 hour  Intake 0 ml  Output 1800 ml  Net -1800 ml   Filed Weights   02/20/20 1302  Weight: 102.1 kg    Examination:  General exam: Appears calm and comfortable  Respiratory system: Clear to auscultation. Respiratory effort normal.  On 11 L HFNC w/ SpO2 98%; turned down to 4 L nasal cannula with SPO2 97-98% Cardiovascular system: S1 & S2 heard, RRR. No JVD, murmurs, rubs, gallops or clicks. No pedal edema. Gastrointestinal system: Abdomen is nondistended, soft and nontender. No organomegaly or masses felt. Normal bowel sounds heard. Central nervous system: Alert and oriented. No focal neurological deficits. Extremities: Symmetric 5 x 5 power. Skin:  No rashes, lesions or ulcers Psychiatry: Judgement and insight appear normal. Mood & affect appropriate.     Data Reviewed: I have personally reviewed following labs and imaging studies  CBC: Recent Labs  Lab 02/21/20 0422 02/22/20 0446 02/23/20 0433 02/24/20 0431 02/25/20 0447  WBC 3.3* 6.8 7.0 6.3 6.8  NEUTROABS 2.4 5.4 5.6 5.1 5.7  HGB 13.0 14.7 13.3 13.3 14.0  HCT 38.7* 44.2 38.6* 39.7 40.6  MCV 82.3 83.1 82.5 83.2 81.4  PLT 206 269 229 233 242   Basic Metabolic Panel: Recent Labs  Lab 02/21/20 0422 02/22/20 0446 02/23/20 0433 02/24/20 0431 02/25/20 0447  NA 131* 136 134* 138 137  K 3.7 4.1 3.8 4.9 4.5  CL 92* 93* 99 99 99  CO2 26 29 24 31 29   GLUCOSE 446* 402* 254* 238* 198*  BUN 23 29* 23 24* 24*  CREATININE 1.10 1.01 0.69 0.78 0.79  CALCIUM 8.5* 9.5 8.7* 8.8* 9.1  MG 2.2 2.2 1.9 2.1  --   PHOS 3.0 5.2* 3.8 4.1  --    GFR: Estimated Creatinine Clearance: 110.9 mL/min (by C-G formula based on SCr of 0.79 mg/dL). Liver Function Tests: Recent Labs  Lab 02/21/20 0422 02/22/20 0446 02/23/20 0433 02/24/20 0431 02/25/20 0447  AST 26  19 20 31 29   ALT 24 25 23  39 45*  ALKPHOS 78 91 68 71 65  BILITOT 0.9 0.8 0.8 0.8 0.8  PROT 7.1 7.9 6.6 6.6 6.6  ALBUMIN 2.7* 2.9* 2.6* 2.6* 2.7*   No results for input(s): LIPASE, AMYLASE in the last 168 hours. No results for input(s): AMMONIA in the last 168 hours. Coagulation Profile: No results for input(s): INR, PROTIME in the last 168 hours. Cardiac Enzymes: No results for input(s): CKTOTAL, CKMB, CKMBINDEX, TROPONINI in the last 168 hours. BNP (last 3 results) No results for input(s): PROBNP in the last 8760 hours. HbA1C: Recent Labs    02/24/20 0431  HGBA1C 11.2*   CBG: Recent Labs  Lab 02/24/20 0759 02/24/20 1305 02/24/20 1630 02/24/20 2033 02/25/20 0913  GLUCAP 222* 165* 228* 241* 237*   Lipid Profile: No results for input(s): CHOL, HDL, LDLCALC, TRIG, CHOLHDL, LDLDIRECT in the last 72  hours. Thyroid Function Tests: No results for input(s): TSH, T4TOTAL, FREET4, T3FREE, THYROIDAB in the last 72 hours. Anemia Panel: Recent Labs    02/23/20 0433 02/24/20 0431  FERRITIN 397* 350*   Sepsis Labs: Recent Labs  Lab 02/20/20 1316  PROCALCITON <0.10    Recent Results (from the past 240 hour(s))  Culture, blood (Routine X 2) w Reflex to ID Panel     Status: None   Collection Time: 02/20/20  2:11 PM   Specimen: BLOOD  Result Value Ref Range Status   Specimen Description BLOOD BLOOD LEFT FOREARM  Final   Special Requests   Final    BOTTLES DRAWN AEROBIC AND ANAEROBIC Blood Culture adequate volume   Culture   Final    NO GROWTH 5 DAYS Performed at [facility], [address] [place], [place] [address]    Report Status 02/25/2020 FINAL  Final  Culture, blood (Routine X 2) w Reflex to ID Panel     Status: None   Collection Time: 02/20/20  2:11 PM   Specimen: BLOOD  Result Value Ref Range Status   Specimen Description BLOOD BLOOD RIGHT HAND  Final   Special Requests   Final    BOTTLES DRAWN AEROBIC AND ANAEROBIC Blood Culture results may not be optimal due to an inadequate volume of blood received in culture bottles   Culture   Final    NO GROWTH 5 DAYS Performed at [facility], [address] [place], [place] [address]    Report Status 02/25/2020 FINAL  Final  Resp Panel by RT-PCR (Flu A&B, Covid) Nasopharyngeal Swab     Status: Abnormal   Collection Time: 02/20/20  2:11 PM   Specimen: Nasopharyngeal Swab; Nasopharyngeal(NP) swabs in vial transport medium  Result Value Ref Range Status   SARS Coronavirus 2 by RT PCR POSITIVE (A) NEGATIVE Final    Comment: RESULT CALLED TO, READ BACK BY AND VERIFIED WITH: JANE MARTIN 02/20/20 1517 KLW (NOTE) SARS-CoV-2 target nucleic acids are DETECTED.  The SARS-CoV-2 RNA is generally detectable in upper respiratory specimens during the acute phase of infection. Positive results are indicative of the  presence of the identified virus, but do not rule out bacterial infection or co-infection with other pathogens not detected by the test. Clinical correlation with patient history and other diagnostic information is necessary to determine patient infection status. The expected result is Negative.  Fact Sheet for Patients: BloggerCourse.comhttps://www.fda.gov/media/152166/download  Fact Sheet for Healthcare Providers: SeriousBroker.ithttps://www.fda.gov/media/152162/download  This test is not yet approved or cleared by the [place] FDA and  has been  authorized for detection and/or diagnosis of SARS-CoV-2 by FDA under an Emergency Use Authorization (EUA).  This EUA will remain in effect (meaning this test can be use d) for the duration of  the COVID-19 declaration under Section 564(b)(1) of the Act, 21 U.S.C. section 360bbb-3(b)(1), unless the authorization is terminated or revoked sooner.     Influenza A by PCR NEGATIVE NEGATIVE Final   Influenza B by PCR NEGATIVE NEGATIVE Final    Comment: (NOTE) The Xpert Xpress SARS-CoV-2/FLU/RSV plus assay is intended as an aid in the diagnosis of influenza from Nasopharyngeal swab specimens and should not be used as a sole basis for treatment. Nasal washings and aspirates are unacceptable for Xpert Xpress SARS-CoV-2/FLU/RSV testing.  Fact Sheet for Patients: BloggerCourse.com  Fact Sheet for Healthcare Providers: SeriousBroker.it  This test is not yet approved or cleared by the [place] FDA and has been authorized for detection and/or diagnosis of SARS-CoV-2 by FDA under an Emergency Use Authorization (EUA). This EUA will remain in effect (meaning this test can be used) for the duration of the COVID-19 declaration under Section 564(b)(1) of the Act, 21 U.S.C. section 360bbb-3(b)(1), unless the authorization is terminated or revoked.  Performed at [facility], [address], [place], [place]  [address]          Radiology Studies: No results found.      Scheduled Meds: . albuterol  2 puff Inhalation Q6H  . vitamin C  500 mg Oral Daily  . baricitinib  4 mg Oral Daily  . carvedilol  6.25 mg Oral BID  . enoxaparin (LOVENOX) injection  50 mg Subcutaneous Q24H  . felodipine  10 mg Oral Daily  . hydrochlorothiazide  25 mg Oral Daily  . insulin aspart  0-15 Units Subcutaneous TID WC  . insulin aspart  15 Units Subcutaneous TID WC  . insulin detemir  65 Units Subcutaneous BID  . losartan  100 mg Oral Daily  . methylPREDNISolone (SOLU-MEDROL) injection  40 mg Intravenous Q12H  . sodium chloride flush  3 mL Intravenous Q12H  . zinc sulfate  220 mg Oral Daily   Continuous Infusions: . sodium chloride       LOS: 5 days    Time spent: 40 minutes spent on chart review, discussion with nursing staff, consultants, updating family and interview/physical exam; more than 50% of that time was spent in counseling and/or coordination of care.    Alvira Philips [place], DO [facility] Available via Epic secure chat 7am-7pm After these hours, please refer to coverage provider listed on [email or web address] 02/25/2020, 10:22 AM

## 2020-02-25 NOTE — Progress Notes (Addendum)
SATURATION QUALIFICATIONS: (This note is used to comply with regulatory documentation for home oxygen)  Patient Saturations on Room Air at Rest = 90-95% RA  Patient Saturations on Room Air while Ambulating = Began with saturation in the 90s. With second lap around room, patient began to desat to 83% RA. Began to apply oxygen, stood still about 2 minutes, applied oxygen and resumed walk after sats came back up to 90%.   Patient Saturations on Liters of oxygen while Ambulating = placed patient on 4L [place] to obtain oxygen saturations in 90s, specifically 94%.   Patient tolerated well. Ambulated total of 5 minutes. He had no issues ambulating, but did get winded halfway through the walk. I stated to stay on room air at rest and if needed he could apply oxygen. He is resting on side of bed with call bell in reach.

## 2020-02-25 NOTE — Progress Notes (Signed)
Spoke with pt by phone this afternoon.  Pt told me he is NOT taking the Jardiance at home.  Is taking his Metformin.  Has been out of the Trulicity for 4 weeks and was planning to restart prior to admission to the hospital.  Spoke with pt about the possibility of needing insulin for home while recuperating from COVID and while getting steroids.  Explained to pt that he is getting large amounts of both Levemir and Novolog insulins.  Explained what these 2 insulins are, how they work, how to take, etc.  Pt seemed mildly frustrated about the possibility of needing insulin for home.  I discussed with pt that ultimately the MD will decide this at time of discharge, however, I want pt to be prepared for it in case the decision is made to send pt home on insulin.  Pt stated he understood and will do what needs to be done.  Has experience with taking Trulicity injections once per week at home.  We talked about how insulin pens differ from the Trulicity injections and verbally reviewed with pt how to use an insulin pen.  I have emailed pt an instructional video on how to use insulin pens at home and asked pt to watch the video on his smart phone today.  Have also asked the RN to please allow pt to give his insulin injection at 5pm today for further practice and to make sure pt comfortable with the insulin injections.     --Will follow patient during hospitalization--  Ambrose Finland RN, MSN, CDE Diabetes Coordinator Inpatient Glycemic Control Team Team Pager: [phone] (8a-5p)

## 2020-02-26 DIAGNOSIS — E871 Hypo-osmolality and hyponatremia: Secondary | ICD-10-CM

## 2020-02-26 LAB — BASIC METABOLIC PANEL
Anion gap: 10 (ref 5–15)
BUN: 30 mg/dL — ABNORMAL HIGH (ref 8–23)
CO2: 31 mmol/L (ref 22–32)
Calcium: 8.7 mg/dL — ABNORMAL LOW (ref 8.9–10.3)
Chloride: 98 mmol/L (ref 98–111)
Creatinine, Ser: 0.79 mg/dL (ref 0.61–1.24)
GFR, Estimated: >60 mL/min (ref >60)
Glucose, Bld: 66 mg/dL — ABNORMAL LOW (ref 70–99)
Potassium: 4.3 mmol/L (ref 3.5–5.1)
Sodium: 139 mmol/L (ref 135–145)

## 2020-02-26 LAB — GLUCOSE, CAPILLARY
Glucose-Capillary: 53 mg/dL — ABNORMAL LOW (ref 70–99)
Glucose-Capillary: 331 mg/dL — ABNORMAL HIGH (ref 70–99)
Glucose-Capillary: 280 mg/dL — ABNORMAL HIGH (ref 70–99)

## 2020-02-26 LAB — C-REACTIVE PROTEIN: CRP: 0.6 mg/dL (ref <1.0)

## 2020-02-26 LAB — FIBRIN DERIVATIVES D-DIMER (ARMC ONLY): Fibrin derivatives D-dimer (ARMC): 369.18 ng/mL (FEU) (ref 0.00–499.00)

## 2020-02-26 MED ORDER — INSULIN DETEMIR 100 UNIT/ML ~~LOC~~ SOLN
30.0000 [IU] | Freq: Two times a day (BID) | SUBCUTANEOUS | Status: DC
  Administered 2020-02-26: 11:00:00 30 [IU] via SUBCUTANEOUS
  Filled 2020-02-26 (×2): qty 0.3

## 2020-02-26 MED ORDER — ALBUTEROL SULFATE HFA 108 (90 BASE) MCG/ACT IN AERS: 2.0000 | INHALATION_SPRAY | Freq: Four times a day (QID) | RESPIRATORY_TRACT | 0 refills | Status: AC | PRN

## 2020-02-26 MED ORDER — INSULIN ASPART 100 UNIT/ML ~~LOC~~ SOLN: 5.0000 [IU] | Freq: Three times a day (TID) | SUBCUTANEOUS | Status: DC

## 2020-02-26 MED ORDER — PREDNISONE 10 MG PO TABS: 40.0000 mg | ORAL_TABLET | Freq: Every day | ORAL | 0 refills | Status: AC

## 2020-02-26 MED ORDER — INSULIN DETEMIR 100 UNIT/ML ~~LOC~~ SOLN: 30.0000 [IU] | Freq: Every day | SUBCUTANEOUS | 0 refills | Status: AC

## 2020-02-26 NOTE — Discharge Summary (Addendum)
Physician Discharge Summary  Lee Adkins ZOX:[id number] DOB: 1955-04-13 DOA: 02/20/2020  PCP: Abram Sander, MD  Admit date: 02/20/2020 Discharge date: 02/26/2020  Discharge disposition: Home   Recommendations for Outpatient Follow-Up:   Follow-up with PCP in 1 week   Discharge Diagnosis:   Principal Problem:   Pneumonia due to COVID-19 virus Active Problems:   Acute respiratory failure due to COVID-19 [facility])   Diabetes mellitus with hyperglycemia (HCC)   Hyponatremia   Hypokalemia   Acute hypoxemic respiratory failure (HCC)    Discharge Condition: Stable.  Diet recommendation:  Diet Order            Diet - low sodium heart healthy           Diet heart healthy/carb modified Room service appropriate? Yes; Fluid consistency: Thin  Diet effective now                   Code Status: Full Code     Hospital Course:   Lee Adkins is a 64 year old male with past medical history significant for essential hypertension, type 2 diabetes mellitus who presented to the ED with progressive shortness of breath.  He also complained of low-grade fever, cough, anorexia and myalgia.  He had tested positive for COVID-19 infection on 02/14/2020.  He was found to have COVID-19 pneumonia complicated by acute hypoxic respiratory failure.  He was treated with IV remdesivir, IV steroids and oral baricitinib.  He was requiring as high as 11 L/min oxygen via high flow nasal cannula.  Hypoxia improved and oxygen therapy was weaned down to 4 L/min oxygen via nasal cannula.    He also had hypokalemia that was treated.  He had hyponatremia that improved with IV fluids.  He has type II DM with hyperglycemia.  Hemoglobin A1c was 11.2.  He was treated with insulin in the hospital and insulin therapy was recommended at discharge.  Close monitoring of glucose levels at home and close follow-up with PCP for adjustments in insulin dose was strongly recommended.  His condition has  improved significantly and he is deemed stable for discharge to home.  However, he will be discharged on 4 L/min oxygen for chronic hypoxic respiratory failure.      Discharge Exam:    Vitals:   02/25/20 1950 02/26/20 0009 02/26/20 0430 02/26/20 0802  BP: 129/70 131/71 128/69 (!) 159/79  Pulse: 67 60 (!) 54 62  Resp: Temp: 98 F (36.7 C) 98.2 F (36.8 C) 98.2 F (36.8 C) 97.6 F (36.4 C)  TempSrc: Oral  Oral Oral  SpO2: 94%   99%  Weight:      Height:         GEN: NAD SKIN: Warm and dry EYES: No pallor or icterus ENT: MMM CV: RRR PULM: CTA B ABD: soft, obese, NT, +BS CNS: AAO x 3, non focal EXT: No edema or tenderness   The results of significant diagnostics from this hospitalization (including imaging, microbiology, ancillary and laboratory) are listed below for reference.     Procedures and Diagnostic Studies:   DG Chest 2 View  Result Date: 02/20/2020 CLINICAL DATA:  COVID for 10 days with persistent dyspnea and hypoxia EXAM: CHEST - 2 VIEW COMPARISON:  10/21/2013 chest radiograph. FINDINGS: Stable cardiomediastinal silhouette with normal heart size. No pneumothorax. No pleural effusion. Moderate patchy opacities throughout the peripheral lungs bilaterally, left greater than right, new. IMPRESSION: New moderate patchy peripheral lung opacities bilaterally, left greater  than right, compatible with COVID-19 pneumonia. Electronically Signed   By: Delbert Phenix M.D.   On: 02/20/2020 13:33     Labs:   Basic Metabolic Panel: Recent Labs  Lab 02/21/20 0422 02/22/20 0446 02/23/20 0433 02/24/20 0431 02/25/20 0447 02/26/20 0419  NA 131* 136 134* 138 137 139  K 3.7 4.1 3.8 4.9 4.5 4.3  CL 92* 93* 99 99 99 98  CO2 GLUCOSE 446* 402* 254* 238* 198* 66*  BUN 23 29* 23 24* 24* 30*  CREATININE 1.10 1.01 0.69 0.78 0.79 0.79  CALCIUM 8.5* 9.5 8.7* 8.8* 9.1 8.7*  MG 2.2 2.2 1.9 2.1  --   --   PHOS 3.0 5.2* 3.8 4.1  --   --     GFR Estimated Creatinine Clearance: 110.9 mL/min (by C-G formula based on SCr of 0.79 mg/dL). Liver Function Tests: Recent Labs  Lab 02/21/20 0422 02/22/20 0446 02/23/20 0433 02/24/20 0431 02/25/20 0447  AST ALT 39 45*  ALKPHOS 78 91 68 71 65  BILITOT 0.9 0.8 0.8 0.8 0.8  PROT 7.1 7.9 6.6 6.6 6.6  ALBUMIN 2.7* 2.9* 2.6* 2.6* 2.7*   No results for input(s): LIPASE, AMYLASE in the last 168 hours. No results for input(s): AMMONIA in the last 168 hours. Coagulation profile No results for input(s): INR, PROTIME in the last 168 hours.  CBC: Recent Labs  Lab 02/21/20 0422 02/22/20 0446 02/23/20 0433 02/24/20 0431 02/25/20 0447  WBC 3.3* 6.8 7.0 6.3 6.8  NEUTROABS 2.4 5.4 5.6 5.1 5.7  HGB 13.0 14.7 13.3 13.3 14.0  HCT 38.7* 44.2 38.6* 39.7 40.6  MCV 82.3 83.1 82.5 83.2 81.4  PLT 206 269 229 233 242   Cardiac Enzymes: No results for input(s): CKTOTAL, CKMB, CKMBINDEX, TROPONINI in the last 168 hours. BNP: Invalid input(s): POCBNP CBG: Recent Labs  Lab 02/25/20 1341 02/25/20 1636 02/25/20 2117 02/26/20 0803 02/26/20 1011  GLUCAP 244* 138* 138* 53* 331*   D-Dimer No results for input(s): DDIMER in the last 72 hours. Hgb A1c Recent Labs    02/24/20 0431  HGBA1C 11.2*   Lipid Profile No results for input(s): CHOL, HDL, LDLCALC, TRIG, CHOLHDL, LDLDIRECT in the last 72 hours. Thyroid function studies No results for input(s): TSH, T4TOTAL, T3FREE, THYROIDAB in the last 72 hours.  Invalid input(s): FREET3 Anemia work up Recent Labs    02/24/20 0431  FERRITIN 350*   Microbiology Recent Results (from the past 240 hour(s))  Culture, blood (Routine X 2) w Reflex to ID Panel     Status: None   Collection Time: 02/20/20  2:11 PM   Specimen: BLOOD  Result Value Ref Range Status   Specimen Description BLOOD BLOOD LEFT FOREARM  Final   Special Requests   Final    BOTTLES DRAWN AEROBIC AND ANAEROBIC Blood Culture adequate volume    Culture   Final    NO GROWTH 5 DAYS Performed at [facility], [address] [place], [place] [address]    Report Status 02/25/2020 FINAL  Final  Culture, blood (Routine X 2) w Reflex to ID Panel     Status: None   Collection Time: 02/20/20  2:11 PM   Specimen: BLOOD  Result Value Ref Range Status   Specimen Description BLOOD BLOOD RIGHT HAND  Final   Special Requests   Final    BOTTLES DRAWN AEROBIC AND ANAEROBIC Blood Culture results may not be  optimal due to an inadequate volume of blood received in culture bottles   Culture   Final    NO GROWTH 5 DAYS Performed at [facility], [address] [place], [place] [address]    Report Status 02/25/2020 FINAL  Final  Resp Panel by RT-PCR (Flu A&B, Covid) Nasopharyngeal Swab     Status: Abnormal   Collection Time: 02/20/20  2:11 PM   Specimen: Nasopharyngeal Swab; Nasopharyngeal(NP) swabs in vial transport medium  Result Value Ref Range Status   SARS Coronavirus 2 by RT PCR POSITIVE (A) NEGATIVE Final    Comment: RESULT CALLED TO, READ BACK BY AND VERIFIED WITH: JANE MARTIN 02/20/20 1517 KLW (NOTE) SARS-CoV-2 target nucleic acids are DETECTED.  The SARS-CoV-2 RNA is generally detectable in upper respiratory specimens during the acute phase of infection. Positive results are indicative of the presence of the identified virus, but do not rule out bacterial infection or co-infection with other pathogens not detected by the test. Clinical correlation with patient history and other diagnostic information is necessary to determine patient infection status. The expected result is Negative.  Fact Sheet for Patients: BloggerCourse.com  Fact Sheet for Healthcare Providers: SeriousBroker.it  This test is not yet approved or cleared by the [place] FDA and  has been authorized for detection and/or diagnosis of SARS-CoV-2 by FDA under an Emergency Use Authorization  (EUA).  This EUA will remain in effect (meaning this test can be use d) for the duration of  the COVID-19 declaration under Section 564(b)(1) of the Act, 21 U.S.C. section 360bbb-3(b)(1), unless the authorization is terminated or revoked sooner.     Influenza A by PCR NEGATIVE NEGATIVE Final   Influenza B by PCR NEGATIVE NEGATIVE Final    Comment: (NOTE) The Xpert Xpress SARS-CoV-2/FLU/RSV plus assay is intended as an aid in the diagnosis of influenza from Nasopharyngeal swab specimens and should not be used as a sole basis for treatment. Nasal washings and aspirates are unacceptable for Xpert Xpress SARS-CoV-2/FLU/RSV testing.  Fact Sheet for Patients: BloggerCourse.com  Fact Sheet for Healthcare Providers: SeriousBroker.it  This test is not yet approved or cleared by the [place] FDA and has been authorized for detection and/or diagnosis of SARS-CoV-2 by FDA under an Emergency Use Authorization (EUA). This EUA will remain in effect (meaning this test can be used) for the duration of the COVID-19 declaration under Section 564(b)(1) of the Act, 21 U.S.C. section 360bbb-3(b)(1), unless the authorization is terminated or revoked.  Performed at [facility], [address], [place], [place] [address]      Discharge Instructions:   Discharge Instructions    Call MD for:  difficulty breathing, headache or visual disturbances   Complete by: As directed    Call MD for:  extreme fatigue   Complete by: As directed    Call MD for:  persistant dizziness or light-headedness   Complete by: As directed    Call MD for:  persistant nausea and vomiting   Complete by: As directed    Call MD for:  temperature >100.4   Complete by: As directed    Diet - low sodium heart healthy   Complete by: As directed    Discharge instructions   Complete by: As directed    ?   Person Under Monitoring Name: Lee Adkins  Location: [address] [place] [address]   Infection Prevention Recommendations for Individuals Confirmed to have, or Being Evaluated for, 2019 Novel Coronavirus (COVID-19) Infection  Who Receive Care at Home  Individuals who are confirmed to have, or are being evaluated for, COVID-19 should follow the prevention steps below until a healthcare provider or local or state health department says they can return to normal activities.  Stay home except to get medical care You should restrict activities outside your home, except for getting medical care. Do not go to work, school, or public areas, and do not use public transportation or taxis.  Call ahead before visiting your doctor Before your medical appointment, call the healthcare provider and tell them that you have, or are being evaluated for, COVID-19 infection. This will help the healthcare provider's office take steps to keep other people from getting infected. Ask your healthcare provider to call the local or state health department.  Monitor your symptoms Seek prompt medical attention if your illness is worsening (e.g., difficulty breathing). Before going to your medical appointment, call the healthcare provider and tell them that you have, or are being evaluated for, COVID-19 infection. Ask your healthcare provider to call the local or state health department.  Wear a facemask You should wear a facemask that covers your nose and mouth when you are in the same room with other people and when you visit a healthcare provider. People who live with or visit you should also wear a facemask while they are in the same room with you.  Separate yourself from other people in your home As much as possible, you should stay in a different room from other people in your home. Also, you should use a separate bathroom, if available.  Avoid sharing household items You should not share dishes, drinking glasses, cups,  eating utensils, towels, bedding, or other items with other people in your home. After using these items, you should wash them thoroughly with soap and water.  Cover your coughs and sneezes Cover your mouth and nose with a tissue when you cough or sneeze, or you can cough or sneeze into your sleeve. Throw used tissues in a lined trash can, and immediately wash your hands with soap and water for at least 20 seconds or use an alcohol-based hand rub.  Wash your [employer] your hands often and thoroughly with soap and water for at least 20 seconds. You can use an alcohol-based hand sanitizer if soap and water are not available and if your hands are not visibly dirty. Avoid touching your eyes, nose, and mouth with unwashed hands.   Prevention Steps for Caregivers and Household Members of Individuals Confirmed to have, or Being Evaluated for, COVID-19 Infection Being Cared for in the Home  If you live with, or provide care at home for, a person confirmed to have, or being evaluated for, COVID-19 infection please follow these guidelines to prevent infection:  Follow healthcare provider's instructions Make sure that you understand and can help the patient follow any healthcare provider instructions for all care.  Provide for the patient's basic needs You should help the patient with basic needs in the home and provide support for getting groceries, prescriptions, and other personal needs.  Monitor the patient's symptoms If they are getting sicker, call his or her medical provider and tell them that the patient has, or is being evaluated for, COVID-19 infection. This will help the healthcare provider's office take steps to keep other people from getting infected. Ask the healthcare provider to call the local or state health department.  Limit the number of people who have contact with the patient If possible, have  only one caregiver for the patient. Other household members should stay in  another home or place of residence. If this is not possible, they should stay in another room, or be separated from the patient as much as possible. Use a separate bathroom, if available. Restrict visitors who do not have an essential need to be in the home.  Keep older adults, very young children, and other sick people away from the patient Keep older adults, very young children, and those who have compromised immune systems or chronic health conditions away from the patient. This includes people with chronic heart, lung, or kidney conditions, diabetes, and cancer.  Ensure good ventilation Make sure that shared spaces in the home have good air flow, such as from an air conditioner or an opened window, weather permitting.  Wash your hands often Wash your hands often and thoroughly with soap and water for at least 20 seconds. You can use an alcohol based hand sanitizer if soap and water are not available and if your hands are not visibly dirty. Avoid touching your eyes, nose, and mouth with unwashed hands. Use disposable paper towels to dry your hands. If not available, use dedicated cloth towels and replace them when they become wet.  Wear a facemask and gloves Wear a disposable facemask at all times in the room and gloves when you touch or have contact with the patient's blood, body fluids, and/or secretions or excretions, such as sweat, saliva, sputum, nasal mucus, vomit, urine, or feces.  Ensure the mask fits over your nose and mouth tightly, and do not touch it during use. Throw out disposable facemasks and gloves after using them. Do not reuse. Wash your hands immediately after removing your facemask and gloves. If your personal clothing becomes contaminated, carefully remove clothing and launder. Wash your hands after handling contaminated clothing. Place all used disposable facemasks, gloves, and other waste in a lined container before disposing them with other household waste. Remove  gloves and wash your hands immediately after handling these items.  Do not share dishes, glasses, or other household items with the patient Avoid sharing household items. You should not share dishes, drinking glasses, cups, eating utensils, towels, bedding, or other items with a patient who is confirmed to have, or being evaluated for, COVID-19 infection. After the person uses these items, you should wash them thoroughly with soap and water.  Wash laundry thoroughly Immediately remove and wash clothes or bedding that have blood, body fluids, and/or secretions or excretions, such as sweat, saliva, sputum, nasal mucus, vomit, urine, or feces, on them. Wear gloves when handling laundry from the patient. Read and follow directions on labels of laundry or clothing items and detergent. In general, wash and dry with the warmest temperatures recommended on the label.  Clean all areas the individual has used often Clean all touchable surfaces, such as counters, tabletops, doorknobs, bathroom fixtures, toilets, phones, keyboards, tablets, and bedside tables, every day. Also, clean any surfaces that may have blood, body fluids, and/or secretions or excretions on them. Wear gloves when cleaning surfaces the patient has come in contact with. Use a diluted bleach solution (e.g., dilute bleach with 1 part bleach and 10 parts water) or a household disinfectant with a label that says EPA-registered for coronaviruses. To make a bleach solution at home, add 1 tablespoon of bleach to 1 quart (4 cups) of water. For a larger supply, add  cup of bleach to 1 gallon (16 cups) of water. Read labels of cleaning products  and follow recommendations provided on product labels. Labels contain instructions for safe and effective use of the cleaning product including precautions you should take when applying the product, such as wearing gloves or eye protection and making sure you have good ventilation during use of the  product. Remove gloves and wash hands immediately after cleaning.  Monitor yourself for signs and symptoms of illness Caregivers and household members are considered close contacts, should monitor their health, and will be asked to limit movement outside of the home to the extent possible. Follow the monitoring steps for close contacts listed on the symptom monitoring form.   ? If you have additional questions, contact your local health department or call the epidemiologist on call at [phone] (available 24/7). ? This guidance is subject to change. For the most up-to-date guidance from [facility], please refer to their website: TripMetro.hu   Increase activity slowly   Complete by: As directed      Allergies as of 02/26/2020      Reactions   Amoxicillin Rash      Medication List    TAKE these medications   albuterol 108 (90 Base) MCG/ACT inhaler Commonly known as: VENTOLIN HFA Inhale 2 puffs into the lungs every 6 (six) hours as needed for wheezing or shortness of breath.   carvedilol 6.25 MG tablet Commonly known as: COREG Take 6.25 mg by mouth 2 (two) times daily.   felodipine 10 MG 24 hr tablet Commonly known as: PLENDIL Take 10 mg by mouth daily.   insulin detemir 100 UNIT/ML injection Commonly known as: LEVEMIR Inject 0.3 mLs (30 Units total) into the skin at bedtime.   Jardiance 10 MG Tabs tablet Generic drug: empagliflozin Take 10 mg by mouth daily.   losartan-hydrochlorothiazide 100-25 MG tablet Commonly known as: HYZAAR Take 1 tablet by mouth daily.   metFORMIN 500 MG 24 hr tablet Commonly known as: GLUCOPHAGE-XR Take 1,500 mg by mouth daily with breakfast.   predniSONE 10 MG tablet Commonly known as: DELTASONE Take 4 tablets (40 mg total) by mouth daily for 6 days. Start taking on: February 27, 2020   Trulicity 1.5 MG/0.5ML Sopn Generic drug: Dulaglutide Inject 1.5 mg into the skin once a  week.            Durable Medical Equipment  (From admission, onward)         Start     Ordered   02/25/20 1411  For home use only DME oxygen  Once       Question Answer Comment  Length of Need 12 Months   Mode or (Route) Nasal cannula   Liters per Minute 4   Frequency Continuous (stationary and portable oxygen unit needed)   Oxygen conserving device Yes   Oxygen delivery system Gas      02/25/20 1410            Time coordinating discharge: 33 minutes  Signed:  Kaya Pottenger  [facility] 02/26/2020, 10:50 AM   Pager on [email or web address]. If 7PM-7AM, please contact night-coverage at [email or web address]

## 2020-12-08 ENCOUNTER — Ambulatory Visit
Admit: 2020-12-08 | Discharge: 2020-12-09 | Payer: PRIVATE HEALTH INSURANCE | Attending: Physician Assistant | Primary: Physician Assistant

## 2020-12-08 DIAGNOSIS — Z9989 Dependence on other enabling machines and devices: Principal | ICD-10-CM

## 2020-12-08 DIAGNOSIS — G4733 Obstructive sleep apnea (adult) (pediatric): Principal | ICD-10-CM

## 2020-12-08 DIAGNOSIS — G478 Other sleep disorders: Principal | ICD-10-CM

## 2020-12-08 DIAGNOSIS — R400 Somnolence: Principal | ICD-10-CM

## 2021-04-05 ENCOUNTER — Ambulatory Visit
Admit: 2021-04-05 | Discharge: 2021-04-05 | Payer: PRIVATE HEALTH INSURANCE | Attending: Physician Assistant | Primary: Physician Assistant

## 2021-04-05 ENCOUNTER — Ambulatory Visit
Admit: 2021-04-05 | Discharge: 2021-04-05 | Payer: PRIVATE HEALTH INSURANCE | Attending: Adult Health | Primary: Adult Health

## 2021-04-05 DIAGNOSIS — Z9989 Dependence on other enabling machines and devices: Principal | ICD-10-CM

## 2021-04-05 DIAGNOSIS — G4733 Obstructive sleep apnea (adult) (pediatric): Principal | ICD-10-CM

## 2021-04-05 DIAGNOSIS — I10 Essential (primary) hypertension: Principal | ICD-10-CM

## 2021-04-05 DIAGNOSIS — E785 Hyperlipidemia, unspecified: Principal | ICD-10-CM

## 2021-05-03 MED ORDER — CARVEDILOL 25 MG TABLET
ORAL_TABLET | Freq: Two times a day (BID) | ORAL | 3 refills | 90 days | Status: CP
Start: 2021-05-03 — End: ?

## 2021-08-11 ENCOUNTER — Ambulatory Visit
Admit: 2021-08-11 | Discharge: 2021-08-12 | Payer: PRIVATE HEALTH INSURANCE | Attending: Adult Health | Primary: Adult Health

## 2021-08-11 MED ORDER — EVOLOCUMAB 140 MG/ML SUBCUTANEOUS PEN INJECTOR
SUBCUTANEOUS | 11 refills | 0 days | Status: CP
Start: 2021-08-11 — End: ?
  Filled 2021-08-24: qty 2, 28d supply, fill #0

## 2021-08-11 MED ORDER — LOSARTAN 100 MG-HYDROCHLOROTHIAZIDE 12.5 MG TABLET
ORAL_TABLET | Freq: Every day | ORAL | 3 refills | 90 days | Status: CP
Start: 2021-08-11 — End: 2022-08-11

## 2021-08-12 DIAGNOSIS — E785 Hyperlipidemia, unspecified: Principal | ICD-10-CM

## 2021-08-13 DIAGNOSIS — R0609 Other forms of dyspnea: Principal | ICD-10-CM

## 2021-08-19 IMAGING — CR DG CHEST 2V
1 series · 2 of 2 positions shown · non-contrast
Comparison: 10/21/2013 chest radiograph.

CLINICAL DATA: COVID for 10 days with persistent dyspnea and
hypoxia

EXAM:
CHEST - 2 VIEW

[Series 1: dg chest 2 view · 0.14mm/px · 2 of 2 slices shown]
[im 1/2]
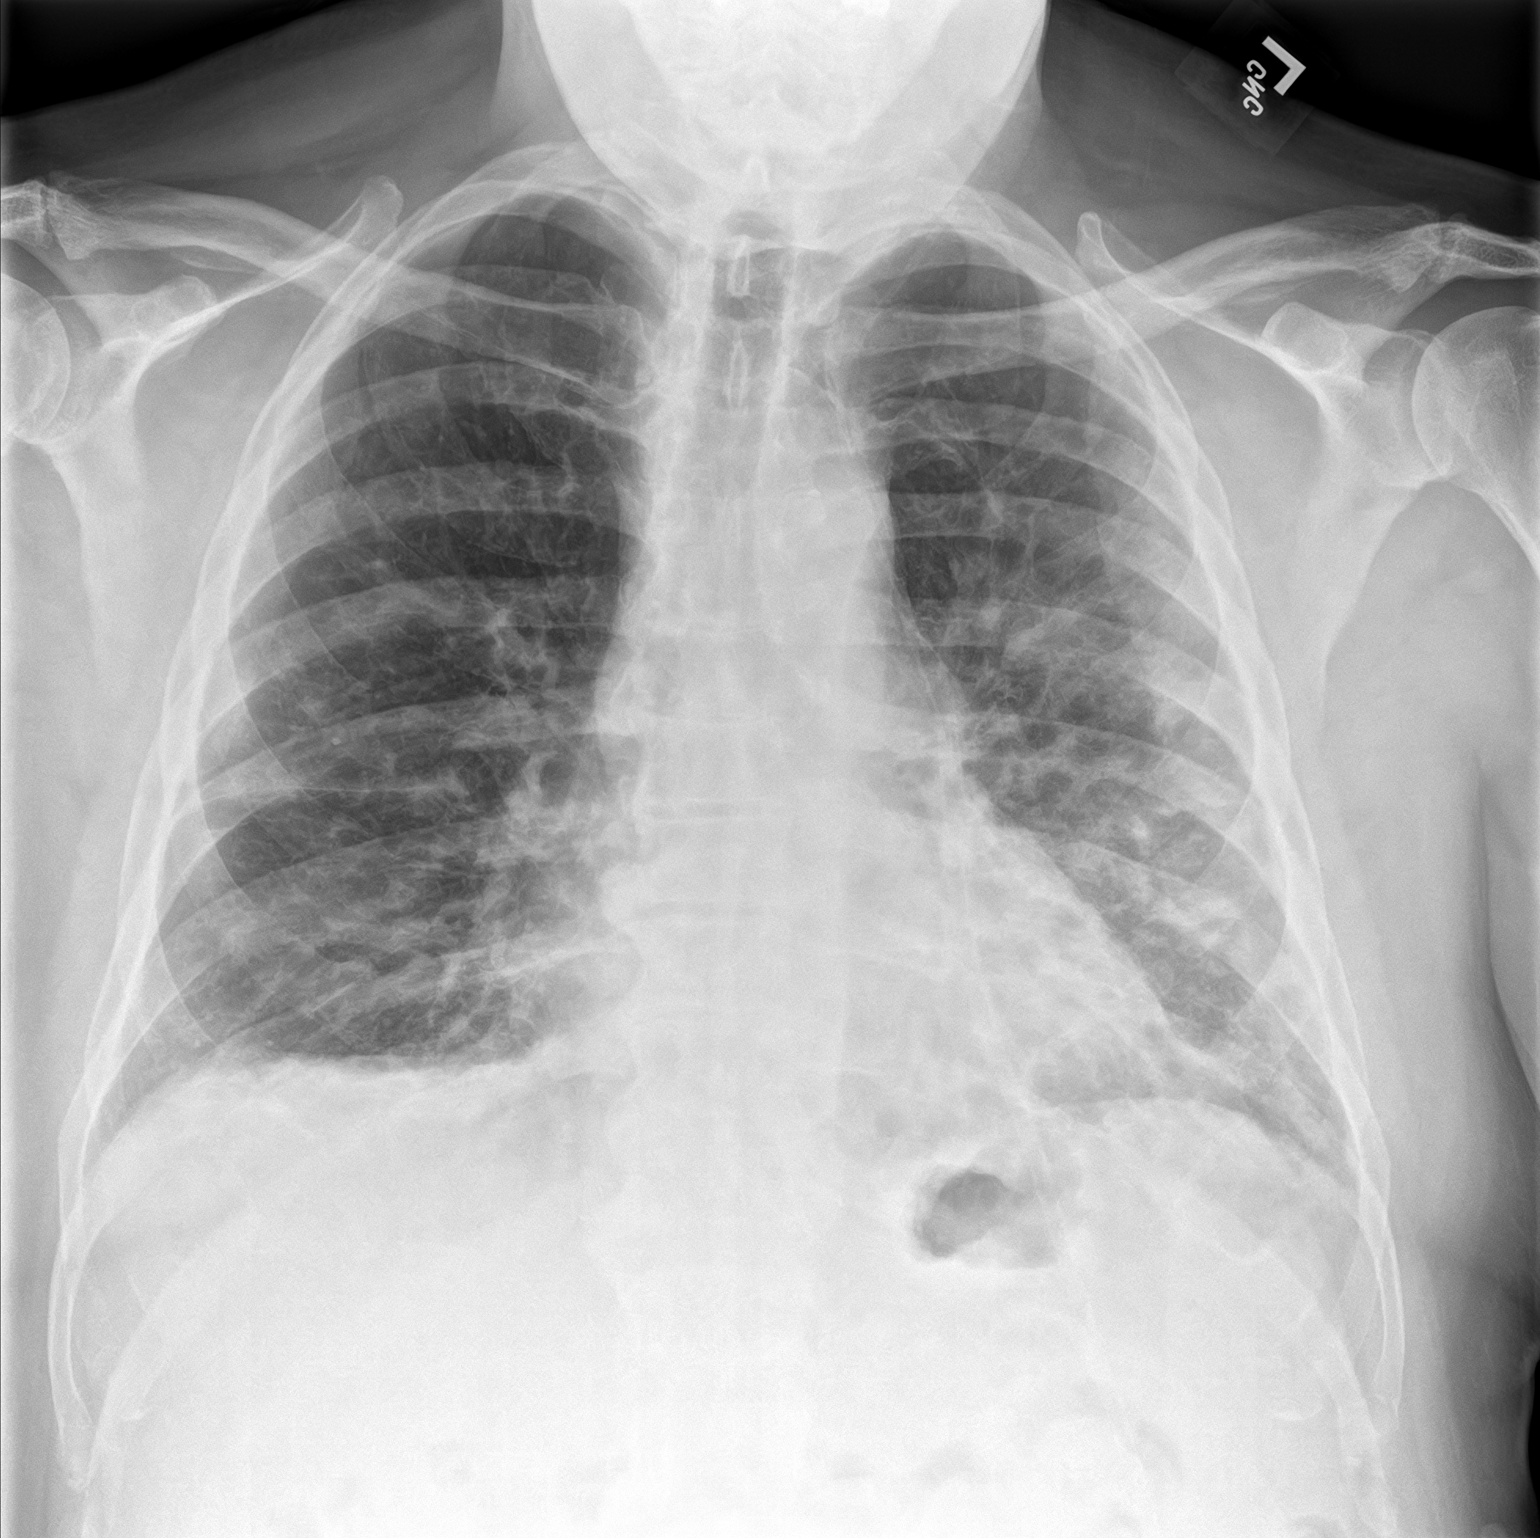
[im 2/2]
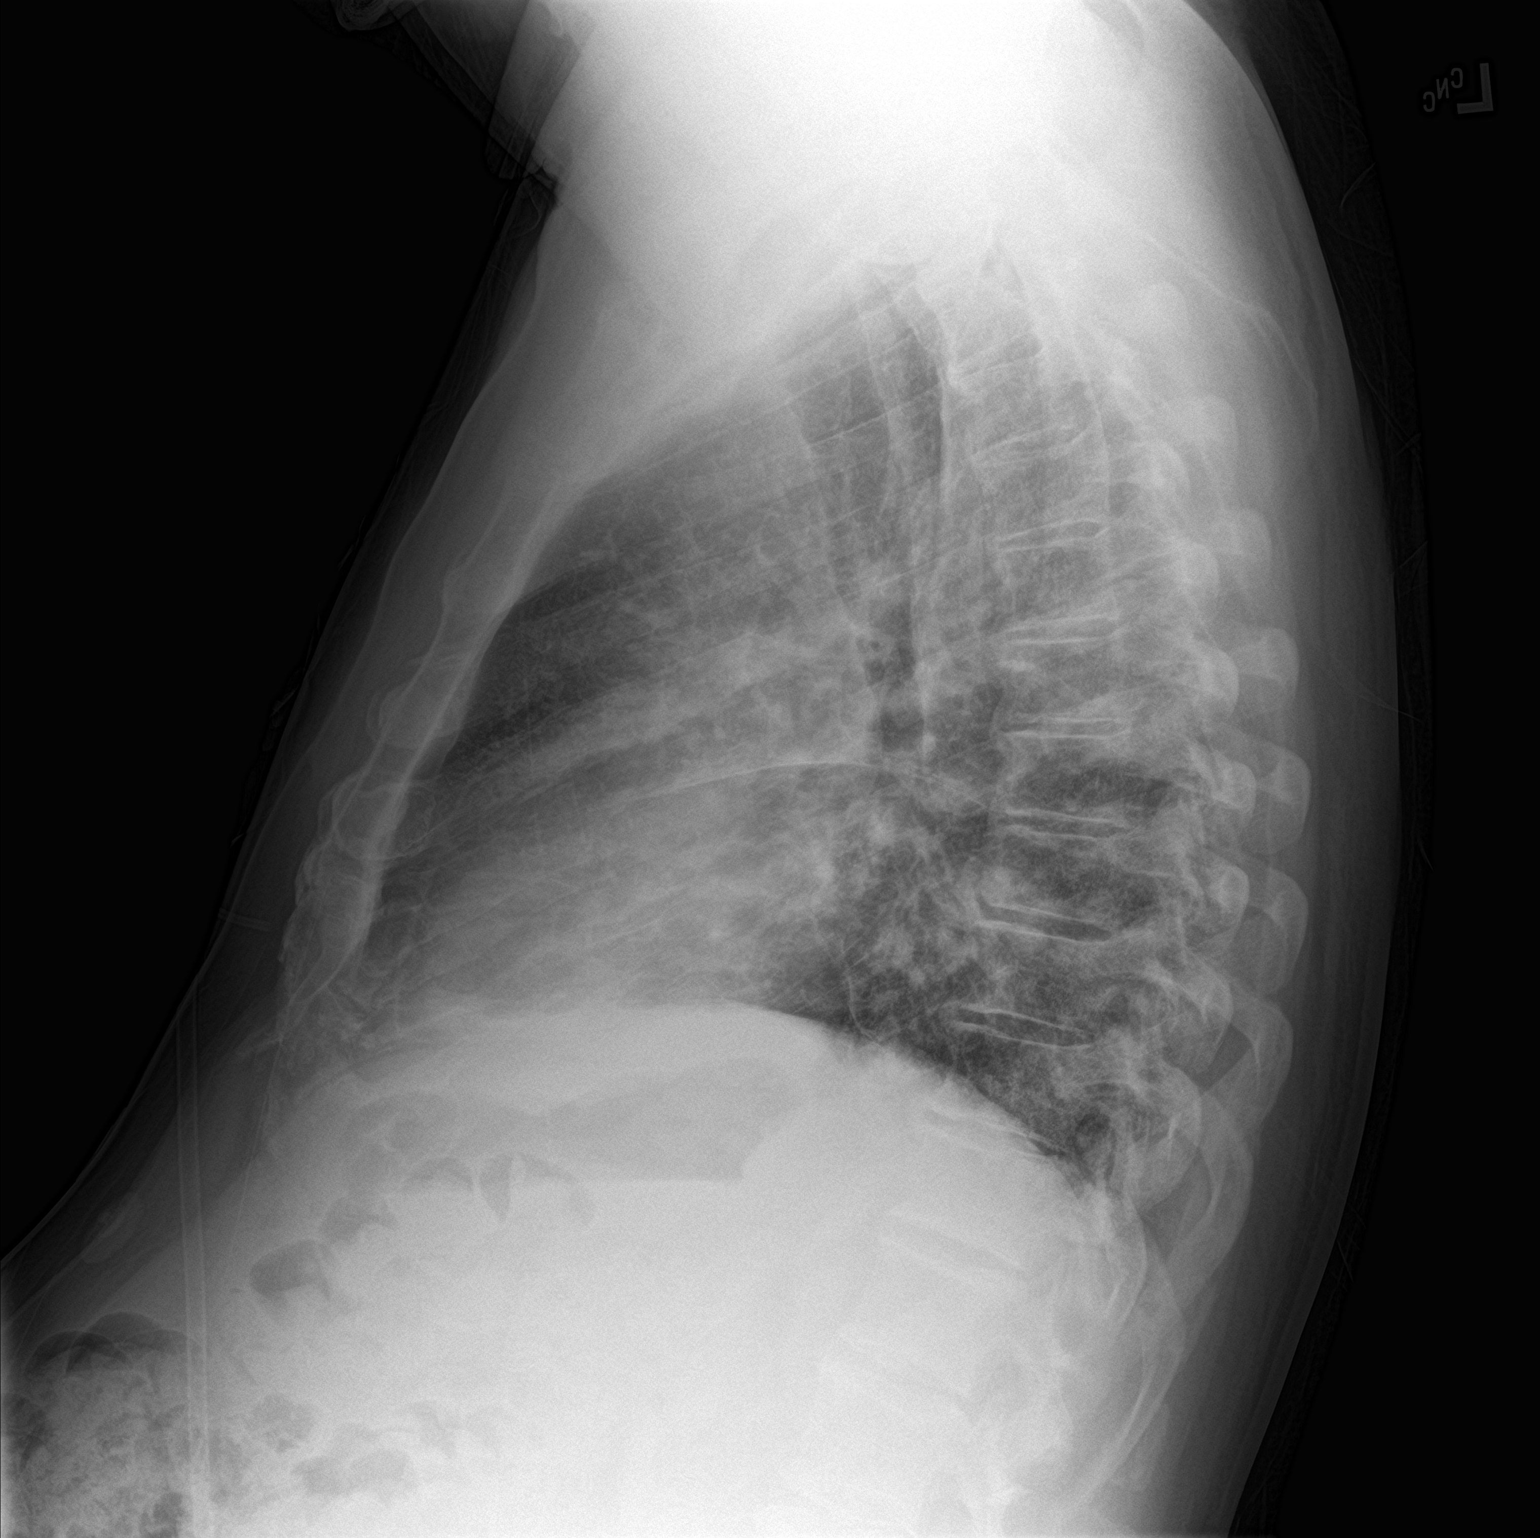

[2 of 2 positions shown; findings below may reference images not displayed]

FINDINGS: Stable cardiomediastinal silhouette with normal heart size. No
pneumothorax. No pleural effusion. Moderate patchy opacities
throughout the peripheral lungs bilaterally, left greater than
right, new.
IMPRESSION: New moderate patchy peripheral lung opacities bilaterally, left
greater than right, compatible with 50OTI-6G pneumonia.

## 2021-08-20 NOTE — Unmapped (Signed)
[facility] Specialty Medication Onboarding    Specialty Medication: REPATHA SURECLICK 140 mg/mL Pnij (evolocumab)  Prior Authorization: Approved   Financial Assistance: Yes - copay card approved as secondary   Final Copay/Day Supply: $5 / 28    Insurance Restrictions: None     Notes to Pharmacist:     The triage team has completed the benefits investigation and has determined that the patient is able to fill this medication at [facility]. Please contact the patient to complete the onboarding or follow up with the prescribing physician as needed.

## 2021-08-23 MED ORDER — EMPTY CONTAINER
2 refills | 0 days
Start: 2021-08-23 — End: ?

## 2021-08-23 NOTE — Unmapped (Signed)
[facility]   Patient Onboarding/Medication Counseling    Mr.Patient is a 66 y.o. male with hyperlipidemia who I am counseling today on initiation of therapy.  I am speaking to the patient.    Was a Nurse, learning disability used for this call? No    Verified patient's date of birth / HIPAA.    Specialty medication(s) to be sent: General Specialty: Repatha      Non-specialty medications/supplies to be sent: Higher education careers adviser      Medications not needed at this time: n/a         Repatha (evolocumab)    Medication & Administration     Dosage: Inject the contents of 1 pen (140mg ) under the skin every 2 weeks.    Administration: Administer under the skin of the abdomen, thigh or upper arm. Rotate sites with each injection.  Injection instructions - Autoinjector:  Remove 1 Repatha autoinjector from the refrigerator and let stand at room temperature for at least 30 minutes.  Check the autoinjector for the following:  Expiration date  Absence of any cracks or damage  The medicine is clear and colorless and does not contain any particles  The orange cap is present and securely attached  Choose your injection site and clean with an alcohol wipe. Allow to air dry completely.  Pull the orange cap straight off and discard  Pinch the skin (or stretch) with your thumb and fingers creating an area 2 inches wide  Maintaining the pinch (or stretch) press the pen to your skin at a 90 degree angle. Firmly push the autoinjector down until the skin stops moving and the yellow safety guard is no longer visible.  Do not touch the gray start button yet  When you are ready to inject, press the gray start button. You will hear a click that signals the start of the injection  Continue to press the pen to your skin and lift your thumb  The injection may take up to 15 seconds. You will know the injection is complete when the medication window turns yellow. You may also hear a second click.  Remove the pen from your skin and discard the pen in a sharps container.  If there is blood at the injection site, press a cotton ball or gauze to the site. Do not rub the injection site.    Adherence/Missed dose instructions: Administer a missed dose within 7 days and resume your normal schedule.  If it has been more than 7 days and you inject every 2 weeks, skip the missed dose and resume your normal schedule..     Goals of Therapy     Lower cholesterol, prevention of cardiovascular events in patients with established cardiovascular disease    Side Effects & Monitoring Parameters   Flu-like symptoms  Signs of a common cold  Back pain  Injection site irritation  Nose or throat irritation    The following side effects should be reported to the provider:  Signs of an allergic reaction  Signs of high blood sugar (confusion, drowsiness, increase thirst/hunger/urination, fast breathing, flushing)      Contraindications, Warnings, & Precautions     Latex (the packaging of Repatha may contain natural rubber)    Drug/Food Interactions     Medication list reviewed in Epic. The patient was instructed to inform the care team before taking any new medications or supplements. No drug interactions identified.     Storage, Handling Precautions, & Disposal   Repatha should be  stored in the refrigerator. If necessary, Repatha may be kept at room temperature for no more than 30 days.  Place used devices in a sharps container for disposal.      Current Medications (including OTC/herbals), Comorbidities and Allergies     Current Outpatient Medications   Medication Sig Dispense Refill    carvediloL (COREG) 25 MG tablet Take 1 tablet (25 mg total) by mouth in the morning and 1 tablet (25 mg total) in the evening. Take with meals. 180 tablet 3    CONTOUR NEXT TEST STRIPS Strp       empty container (SHARPS-A-GATOR DISPOSAL SYSTEM) Misc Use as directed for sharps disposal 1 each 2    evolocumab 140 mg/mL PnIj Inject the contents of one pen (140 mg) under the skin every fourteen (14) days. 2 mL 11    felodipine (PLENDIL) 10 MG 24 hr tablet Take 1 tablet (10 mg total) by mouth daily. 30 tablet 11    JARDIANCE 10 mg tablet       losartan-hydrochlorothiazide (HYZAAR) 100-12.5 mg per tablet Take 1 tablet by mouth daily. 90 tablet 3    polyethylene glycol (GLYCOLAX) 17 gram/dose powder       TRUEPLUS PEN NEEDLE 31 gauge x 3/16 (5 mm) Ndle       VICTOZA 3-PAK 0.6 mg/0.1 mL (18 mg/3 mL) injection        No current facility-administered medications for this visit.       Allergies   Allergen Reactions    Amoxicillin Rash       Patient Active Problem List   Diagnosis    Calculus of kidney    Enlarged prostate with (LUTS)    Encounter for long-term (current) use of other medications    Testicular hypofunction    Renal colic    Essential hypertension    Hyperlipidemia    Type II or unspecified type diabetes mellitus without mention of complication, uncontrolled    OSA on CPAP       Reviewed and up to date in Epic.    Appropriateness of Therapy     Acute infections noted within Epic:  No active infections  Patient reported infection: None    Is medication and dose appropriate based on diagnosis and infection status? Yes    Prescription has been clinically reviewed: Yes      Baseline Quality of Life Assessment      How many days over the past month did your hyperlipidemia  keep you from your normal activities? For example, brushing your teeth or getting up in the morning. Patient declined to answer    Financial Information     Medication Assistance provided: Prior Authorization and Copay Assistance    Anticipated copay of $5 (28 days) reviewed with patient. Verified delivery address.    Delivery Information     Scheduled delivery date: 08/24/21    Expected start date: 08/24/21    Medication will be delivered via Same Day Courier to the prescription address in [facility].  This shipment will not require a signature.      Explained the services we provide at [facility] and that each month we would call to set up refills.  Stressed importance of returning phone calls so that we could ensure they receive their medications in time each month.  Informed patient that we should be setting up refills 7-10 days prior to when they will run out of medication.  A pharmacist will reach out to  perform a clinical assessment periodically.  Informed patient that a welcome packet, containing information about our pharmacy and other support services, a Notice of Privacy Practices, and a drug information handout will be sent.      The patient or caregiver noted above participated in the development of this care plan and knows that they can request review of or adjustments to the care plan at any time.      Patient or caregiver verbalized understanding of the above information as well as how to contact the pharmacy at [phone] option 4 with any questions/concerns.  The pharmacy is open Monday through Friday 8:30am-4:30pm.  A pharmacist is available 24/7 via pager to answer any clinical questions they may have.    Patient Specific Needs     Does the patient have any physical, cognitive, or cultural barriers? No    Does the patient have adequate living arrangements? (i.e. the ability to store and take their medication appropriately) Yes    Did you identify any home environmental safety or security hazards? No    Patient prefers to have medications discussed with  Patient     Is the patient or caregiver able to read and understand education materials at a high school level or above? Yes    Patient's primary language is  English     Is the patient high risk? No    SOCIAL DETERMINANTS OF HEALTH     At the [facility], we have learned that life circumstances - like trouble affording food, housing, utilities, or transportation can affect the health of many of our patients.   That is why we wanted to ask: are you currently experiencing any life circumstances that are negatively impacting your health and/or quality of life? Patient declined to answer    Social Determinants of Health     Financial Resource Strain: Not on file   Internet Connectivity: Not on file   Food Insecurity: Not on file   Tobacco Use: Medium Risk    Smoking Tobacco Use: Never    Smokeless Tobacco Use: Former    Passive Exposure: Not on file   Housing/Utilities: Not on file   Alcohol Use: Not on file   Transportation Needs: Not on file   Substance Use: Not on file   Health Literacy: Not on file   Physical Activity: Not on file   Interpersonal Safety: Not on file   Stress: Not on file   Intimate Partner Violence: Not on file   Depression: Not on file   Social Connections: Not on file       Would you be willing to receive help with any of the needs that you have identified today? Not applicable       Camillo Flaming  [facility] Specialty Pharmacist

## 2021-08-24 MED FILL — EMPTY CONTAINER: 120 days supply | Qty: 1 | Fill #0

## 2021-09-01 ENCOUNTER — Ambulatory Visit: Admit: 2021-09-01 | Discharge: 2021-09-02 | Payer: PRIVATE HEALTH INSURANCE

## 2021-09-01 DIAGNOSIS — R0609 Other forms of dyspnea: Principal | ICD-10-CM

## 2021-09-01 MED ADMIN — regadenoson (LEXISCAN) injection: .4 mg | INTRAVENOUS | @ 14:00:00 | Stop: 2021-09-01

## 2021-09-01 MED ADMIN — Tc-99m Sestamibi (Cardiolite): 16.4 | INTRAVENOUS | @ 14:00:00 | Stop: 2021-09-01

## 2021-09-01 MED ADMIN — Tc-99m Sestamibi (Cardiolite): 45.9 | INTRAVENOUS | @ 15:00:00 | Stop: 2021-09-01

## 2021-09-02 NOTE — Unmapped (Signed)
Spoke to patient about stress test results.  Low risk, probably normal.  Very reassuring.  He has Repatha and is planning to start it tomorrow.  Has continued to have some occasional episodes of lightheadedness with closing in sensation with associated shortness of breath.  Approximately 2.  Since I last saw him.  He is drinking plenty of water he has not been able to check blood pressure during 1 of these but will do so.  He will notify me.

## 2021-09-20 NOTE — Unmapped (Signed)
[facility] Clinical Assessment & Refill Coordination Note    George Moore, DOB: 1955-04-20  Phone: [phone] (home)     All above HIPAA information was verified with patient.     Was a Nurse, learning disability used for this call? No    Specialty Medication(s):   General Specialty: Repatha     Current Outpatient Medications   Medication Sig Dispense Refill    carvediloL (COREG) 25 MG tablet Take 1 tablet (25 mg total) by mouth in the morning and 1 tablet (25 mg total) in the evening. Take with meals. 180 tablet 3    CONTOUR NEXT TEST STRIPS Strp       empty container (SHARPS-A-GATOR DISPOSAL SYSTEM) Misc Use as directed for sharps disposal 1 each 2    evolocumab 140 mg/mL PnIj Inject the contents of one pen (140 mg) under the skin every fourteen (14) days. 2 mL 11    felodipine (PLENDIL) 10 MG 24 hr tablet Take 1 tablet (10 mg total) by mouth daily. 30 tablet 11    JARDIANCE 10 mg tablet       losartan-hydrochlorothiazide (HYZAAR) 100-12.5 mg per tablet Take 1 tablet by mouth daily. 90 tablet 3    polyethylene glycol (GLYCOLAX) 17 gram/dose powder       TRUEPLUS PEN NEEDLE 31 gauge x 3/16 (5 mm) Ndle       VICTOZA 3-PAK 0.6 mg/0.1 mL (18 mg/3 mL) injection        No current facility-administered medications for this visit.        Changes to medications: [name] reports no changes at this time.    Allergies   Allergen Reactions    Amoxicillin Rash       Changes to allergies: No    SPECIALTY MEDICATION ADHERENCE     Repatha  140 mg/ml: 14 days of medicine on hand       Medication Adherence    Patient reported X missed doses in the last month: 0  Specialty Medication: Repatha 140mg /mL  Informant: patient          Specialty medication(s) dose(s) confirmed: Regimen is correct and unchanged.     Are there any concerns with adherence? No    Adherence counseling provided? Not needed    CLINICAL MANAGEMENT AND INTERVENTION      Clinical Benefit Assessment:    Do you feel the medicine is effective or helping your condition? Yes    Clinical Benefit counseling provided? Not needed    Adverse Effects Assessment:    Are you experiencing any side effects? No    Are you experiencing difficulty administering your medicine? No    Quality of Life Assessment:       How many days over the past month did your hyperlipidemia  keep you from your normal activities? For example, brushing your teeth or getting up in the morning. 0    Have you discussed this with your provider? Not needed    Acute Infection Status:    Acute infections noted within Epic:  No active infections  Patient reported infection: None    Therapy Appropriateness:    Is therapy appropriate and patient progressing towards therapeutic goals? Yes, therapy is appropriate and should be continued    DISEASE/MEDICATION-SPECIFIC INFORMATION      For patients on injectable medications: Patient currently has 0 doses left.  Next injection is scheduled for 10/04/21.    PATIENT SPECIFIC NEEDS     Does the patient have any  physical, cognitive, or cultural barriers? No    Is the patient high risk? No    Does the patient require a Care Management Plan? No     SOCIAL DETERMINANTS OF HEALTH     At the [facility], we have learned that life circumstances - like trouble affording food, housing, utilities, or transportation can affect the health of many of our patients.   That is why we wanted to ask: are you currently experiencing any life circumstances that are negatively impacting your health and/or quality of life? Patient declined to answer    Social Determinants of Health     Financial Resource Strain: Not on file   Internet Connectivity: Not on file   Food Insecurity: Not on file   Tobacco Use: Medium Risk    Smoking Tobacco Use: Never    Smokeless Tobacco Use: Former    Passive Exposure: Not on file   Housing/Utilities: Not on file   Alcohol Use: Not on file   Transportation Needs: Not on file   Substance Use: Not on file   Health Literacy: Not on file   Physical Activity: Not on file   Interpersonal Safety: Not on file   Stress: Not on file   Intimate Partner Violence: Not on file   Depression: Not on file   Social Connections: Not on file       Would you be willing to receive help with any of the needs that you have identified today? Not applicable       SHIPPING     Specialty Medication(s) to be Shipped:   General Specialty: Repatha    Other medication(s) to be shipped: No additional medications requested for fill at this time     Changes to insurance: No    Delivery Scheduled: Yes, Expected medication delivery date: 09/23/21.     Medication will be delivered via Same Day Courier to the confirmed prescription address in [facility].    The patient will receive a drug information handout for each medication shipped and additional FDA Medication Guides as required.  Verified that patient has previously received a Conservation officer, historic buildings and a Surveyor, mining.    The patient or caregiver noted above participated in the development of this care plan and knows that they can request review of or adjustments to the care plan at any time.      All of the patient's questions and concerns have been addressed.    Camillo Flaming   [facility] Specialty Pharmacist

## 2021-09-23 MED FILL — REPATHA SURECLICK 140 MG/ML SUBCUTANEOUS PEN INJECTOR: SUBCUTANEOUS | 28 days supply | Qty: 2 | Fill #1

## 2021-10-05 DIAGNOSIS — E785 Hyperlipidemia, unspecified: Principal | ICD-10-CM

## 2021-10-12 ENCOUNTER — Ambulatory Visit: Admit: 2021-10-12 | Discharge: 2021-10-13 | Payer: PRIVATE HEALTH INSURANCE

## 2021-10-12 LAB — LIPID PANEL
CHOLESTEROL/HDL RATIO SCREEN: 2.7 (ref 1.0–4.5)
CHOLESTEROL: 81 mg/dL (ref <=200)
HDL CHOLESTEROL: 30 mg/dL — ABNORMAL LOW (ref 40–60)
LDL CHOLESTEROL CALCULATED: 29 mg/dL — ABNORMAL LOW (ref 40–99)
NON-HDL CHOLESTEROL: 51 mg/dL — ABNORMAL LOW (ref 70–130)
TRIGLYCERIDES: 109 mg/dL (ref 0–150)
VLDL CHOLESTEROL CAL: 21.8 mg/dL (ref 12–42)

## 2021-10-12 NOTE — Unmapped (Signed)
-----   Message from Minda Meo, RN sent at 10/12/2021  3:51 PM EDT -----  Pt aware.  ----- Message -----  From: Minda Meo, RN  Sent: 10/12/2021   3:48 PM EDT  To: Griffin Basil; #    [facility]  ----- Message -----  From: Griffin Basil  Sent: 10/12/2021   3:29 PM EDT  To: #    Please let him know that LDL cholesterol looks great - has dropped from 116>>29 on repatha. No changes needed.

## 2021-10-12 NOTE — Unmapped (Signed)
Patient aware and verbalizes understanding. 

## 2021-10-19 NOTE — Unmapped (Signed)
[facility] Refill Coordination Note    Specialty Medication(s) to be Shipped:   General Specialty: Repatha    Other medication(s) to be shipped: No additional medications requested for fill at this time     George Moore, DOB: 1955-04-06  Phone: [phone] (home)       All above HIPAA information was verified with patient.     Was a Nurse, learning disability used for this call? No    Completed refill call assessment today to schedule patient's medication shipment from the [facility] [phone]).  All relevant notes have been reviewed.     Specialty medication(s) and dose(s) confirmed: Regimen is correct and unchanged.   Changes to medications: Tavon reports no changes at this time.  Changes to insurance: No  New side effects reported not previously addressed with a pharmacist or physician: None reported  Questions for the pharmacist: No    Confirmed patient received a Conservation officer, historic buildings and a Surveyor, mining with first shipment. The patient will receive a drug information handout for each medication shipped and additional FDA Medication Guides as required.       DISEASE/MEDICATION-SPECIFIC INFORMATION        For patients on injectable medications: Patient currently has 0 doses left.  Next injection is scheduled for 10/31/21.    SPECIALTY MEDICATION ADHERENCE     Medication Adherence    Patient reported X missed doses in the last month: 0  Specialty Medication: Repatha 140mg /ml  Patient is on additional specialty medications: No  Patient is on more than two specialty medications: No                          Were doses missed due to medication being on hold? No    Repatha 140 mg/ml: 0 days of medicine on hand       REFERRAL TO PHARMACIST     Referral to the pharmacist: Not needed      [facility]     Shipping address confirmed in Epic.     Delivery Scheduled: Yes, Expected medication delivery date: 10/26/21.     Medication will be delivered via Same Day Courier to the prescription address in Epic WAM.    Nancy Nordmann [facility] Specialty Technician

## 2021-10-26 MED FILL — REPATHA SURECLICK 140 MG/ML SUBCUTANEOUS PEN INJECTOR: SUBCUTANEOUS | 28 days supply | Qty: 2 | Fill #2

## 2021-11-18 NOTE — Unmapped (Signed)
[facility] Refill Coordination Note    Specialty Medication(s) to be Shipped:   General Specialty: Repatha    Other medication(s) to be shipped: No additional medications requested for fill at this time     George Moore, DOB: 1955-06-06  Phone: [phone] (home)       All above HIPAA information was verified with patient.     Was a Nurse, learning disability used for this call? No    Completed refill call assessment today to schedule patient's medication shipment from the [facility] [phone]).  All relevant notes have been reviewed.     Specialty medication(s) and dose(s) confirmed: Regimen is correct and unchanged.   Changes to medications: Jaymarion reports no changes at this time.  Changes to insurance: No  New side effects reported not previously addressed with a pharmacist or physician: None reported  Questions for the pharmacist: No    Confirmed patient received a Conservation officer, historic buildings and a Surveyor, mining with first shipment. The patient will receive a drug information handout for each medication shipped and additional FDA Medication Guides as required.       DISEASE/MEDICATION-SPECIFIC INFORMATION        For patients on injectable medications: Patient currently has 0 doses left.  Next injection is scheduled for 11/29/21.    SPECIALTY MEDICATION ADHERENCE     Medication Adherence    Patient reported X missed doses in the last month: 0  Specialty Medication: Repatha 140mg /ml  Patient is on additional specialty medications: No  Patient is on more than two specialty medications: No                          Were doses missed due to medication being on hold? No    Repatha 140 mg/ml: 0 days of medicine on hand       REFERRAL TO PHARMACIST     Referral to the pharmacist: Not needed      [facility]     Shipping address confirmed in Epic.     Delivery Scheduled: Yes, Expected medication delivery date: 11/23/21.     Medication will be delivered via Same Day Courier to the prescription address in Epic WAM.    Nancy Nordmann [facility] Specialty Technician

## 2021-11-23 MED FILL — REPATHA SURECLICK 140 MG/ML SUBCUTANEOUS PEN INJECTOR: SUBCUTANEOUS | 28 days supply | Qty: 2 | Fill #3

## 2021-12-15 NOTE — Unmapped (Signed)
[facility] Refill Coordination Note    Specialty Medication(s) to be Shipped:   General Specialty: Repatha    Other medication(s) to be shipped: No additional medications requested for fill at this time     George Moore, DOB: 1955/06/20  Phone: [phone] (home)       All above HIPAA information was verified with patient.     Was a Nurse, learning disability used for this call? No    Completed refill call assessment today to schedule patient's medication shipment from the [facility] [phone]).  All relevant notes have been reviewed.     Specialty medication(s) and dose(s) confirmed: Regimen is correct and unchanged.   Changes to medications: Rajveer reports no changes at this time.  Changes to insurance: No  New side effects reported not previously addressed with a pharmacist or physician: None reported  Questions for the pharmacist: No    Confirmed patient received a Conservation officer, historic buildings and a Surveyor, mining with first shipment. The patient will receive a drug information handout for each medication shipped and additional FDA Medication Guides as required.       DISEASE/MEDICATION-SPECIFIC INFORMATION        For patients on injectable medications: Patient currently has 0 doses left.  Next injection is scheduled for 12/27/21.    SPECIALTY MEDICATION ADHERENCE     Medication Adherence    Patient reported X missed doses in the last month: 0  Specialty Medication: Repatha 140mg /ml  Patient is on additional specialty medications: No                                Were doses missed due to medication being on hold? No    Repatha 140 mg/ml: 0 days of medicine on hand     REFERRAL TO PHARMACIST     Referral to the pharmacist: Not needed      [facility]     Shipping address confirmed in Epic.     Delivery Scheduled: Yes, Expected medication delivery date: 12/22/21.     Medication will be delivered via Same Day Courier to the prescription address in Epic WAM.    Tera Helper, [facility]   [facility] Specialty Pharmacist

## 2021-12-20 NOTE — Unmapped (Signed)
George Moore called back 12/20/21 to reschedule his delivery of Repatha for 12/23/21. NRS

## 2021-12-23 MED FILL — REPATHA SURECLICK 140 MG/ML SUBCUTANEOUS PEN INJECTOR: SUBCUTANEOUS | 28 days supply | Qty: 2 | Fill #4

## 2022-01-24 NOTE — Unmapped (Signed)
The [facility] has made a second and final attempt to reach this patient to refill the following medication: Repatha.      We have sent a text message to the following phone numbers: [phone] and have sent a Mychart questionnaire..    Dates contacted: 01/13/22; 01/24/22  Last scheduled delivery: 12/23/21    The patient may be at risk of non-compliance with this medication. The patient should call the [facility] at [phone]  Option 4, then Option 2 (all other specialty patients) to refill medication.    George Moore   [facility] Specialty Technician

## 2022-01-24 NOTE — Unmapped (Signed)
[facility] Pharm[facility] Refill Coordination Note    Specialty Medication(s) to be Shipped:   General Specialty: Repatha    Other medication(s) to be shipped: No additional medications requested for fill at this time     George Moore, DOB: 1955/06/13  Phone: [phone] (home)       All above HIPAA information was verified with patient.     Was a Nurse, learning disability used for this call? No    Completed refill call assessment today to schedule patient's medication shipment from the [facility] [phone]).  All relevant notes have been reviewed.     Specialty medication(s) and dose(s) confirmed: Regimen is correct and unchanged.   Changes to medications: George Moore reports no changes at this time.  Changes to insurance: No  New side effects reported not previously addressed with a pharmacist or physician: None reported  Questions for the pharmacist: No    Confirmed patient received a Conservation officer, historic buildings and a Surveyor, mining with first shipment. The patient will receive a drug information handout for each medication shipped and additional FDA Medication Guides as required.       DISEASE/MEDICATION-SPECIFIC INFORMATION        For patients on injectable medications: Patient currently has 0 doses left.  Next injection is scheduled for 01/29/22.    SPECIALTY MEDICATION ADHERENCE     Medication Adherence    Patient reported X missed doses in the last month: 0  Specialty Medication: Repatha 140mg /ml  Patient is on additional specialty medications: No  Patient is on more than two specialty medications: No                                Were doses missed due to medication being on hold? No    REFERRAL TO PHARMACIST     Referral to the pharmacist: Not needed      [facility]     Shipping address confirmed in Epic.     Delivery Scheduled: Yes, Expected medication delivery date: 01/26/22.     Medication will be delivered via Same Day Courier to the prescription address in Epic WAM.    Quintella Reichert   [facility] Specialty Technician

## 2022-01-26 MED FILL — REPATHA SURECLICK 140 MG/ML SUBCUTANEOUS PEN INJECTOR: SUBCUTANEOUS | 28 days supply | Qty: 2 | Fill #5

## 2022-02-24 NOTE — Unmapped (Signed)
The [facility] has made a second and final attempt to reach this patient to refill the following medication: Repatha.      We have sent a text message to the following phone numbers: [phone] and have sent a Mychart questionnaire..    Dates contacted: 12/12 and 02/23/22  Last scheduled delivery: 01/26/22    The patient may be at risk of non-compliance with this medication. The patient should call the [facility] at [phone]  Option 4, then Option 2 (all other specialty patients) to refill medication.    Joslyn Devon   [facility] Specialty Technician

## 2022-03-03 NOTE — Unmapped (Signed)
[facility] Refill Coordination Note    Specialty Medication(s) to be Shipped:   General Specialty: Repatha    Other medication(s) to be shipped: No additional medications requested for fill at this time     George Moore, DOB: November 02, 1955  Phone: [phone] (home)       All above HIPAA information was verified with patient.     Was a Nurse, learning disability used for this call? No    Completed refill call assessment today to schedule patient's medication shipment from the [facility] [phone]).  All relevant notes have been reviewed.     Specialty medication(s) and dose(s) confirmed: Regimen is correct and unchanged.   Changes to medications: George Moore reports no changes at this time.  Changes to insurance: No  New side effects reported not previously addressed with a pharmacist or physician: None reported  Questions for the pharmacist: No    Confirmed patient received a Conservation officer, historic buildings and a Surveyor, mining with first shipment. The patient will receive a drug information handout for each medication shipped and additional FDA Medication Guides as required.       DISEASE/MEDICATION-SPECIFIC INFORMATION        For patients on injectable medications: Patient currently has 0 doses left.  Next injection is scheduled for 12/29.    SPECIALTY MEDICATION ADHERENCE     Medication Adherence    Patient reported X missed doses in the last month: 0  Specialty Medication: REPATHA SURECLICK 140 mg/mL Pnij (evolocumab)  Patient is on additional specialty medications: No  Patient is on more than two specialty medications: No                          Were doses missed due to medication being on hold? No    Repatha 140 mg/ml: 0 days of medicine on hand        REFERRAL TO PHARMACIST     Referral to the pharmacist: Not needed      [facility]     Shipping address confirmed in Epic.     Delivery Scheduled: Yes, Expected medication delivery date: 03/04/22.     Medication will be delivered via Same Day Courier to the prescription address in Epic WAM.    Willette Pa   [facility] Specialty Technician

## 2022-03-04 MED FILL — REPATHA SURECLICK 140 MG/ML SUBCUTANEOUS PEN INJECTOR: SUBCUTANEOUS | 28 days supply | Qty: 2 | Fill #6

## 2022-03-30 NOTE — Unmapped (Signed)
[facility] Refill Coordination Note    Specialty Lite Medication(s) to be Shipped:   General Specialty: Repatha    Other medication(s) to be shipped: No additional medications requested for fill at this time     George Moore, DOB: [birth year]  Phone: [phone] (home)       All above HIPAA information was verified with patient.     Was a Nurse, learning disability used for this call? No    Changes to medications: [name] reports no changes at this time.  Changes to insurance: No      REFERRAL TO PHARMACIST     Referral to the pharmacist: Not needed      [facility]     Shipping address confirmed in Epic.     Delivery Scheduled: Yes, Expected medication delivery date: 03/31/22.     Medication will be delivered via Same Day Courier to the prescription address in Epic WAM.    Camillo Flaming, PharmD   [facility] Specialty Pharmacist

## 2022-03-31 MED FILL — REPATHA SURECLICK 140 MG/ML SUBCUTANEOUS PEN INJECTOR: SUBCUTANEOUS | 28 days supply | Qty: 2 | Fill #7

## 2022-05-02 NOTE — Unmapped (Signed)
[facility] Refill Coordination Note    George Moore, DOB: 1955/10/16  Phone: [phone] (home)       All above HIPAA information was verified with patient.         04/29/2022     9:36 AM   Specialty Rx Medication Refill Questionnaire   Which Medications would you like refilled and shipped? Repatha   Please list all current allergies: Penicillin   Have you missed any doses in the last 30 days? No   Have you had any changes to your medication(s) since your last refill? No   How many days remaining of each medication do you have at home? 0   If receiving an injectable medication, next injection date is 05/04/2022   Have you experienced any side effects in the last 30 days? No   Please enter the full address (street address, city, state, zip code) where you would like your medication(s) to be delivered to. [address]  [place]  [address]   Please specify on which day you would like your medication(s) to arrive. Note: if you need your medication(s) within 3 days, please call the pharmacy to schedule your order at [phone]  05/02/2022   Has your insurance changed since your last refill? No   Would you like a pharmacist to call you to discuss your medication(s)? No   Do you require a signature for your package? (Note: if we are billing Medicare Part B or your order contains a controlled substance, we will require a signature) No         Completed refill call assessment today to schedule patient's medication shipment from the [facility] [phone]).  All relevant notes have been reviewed.       Confirmed patient received a Conservation officer, historic buildings and a Surveyor, mining with first shipment. The patient will receive a drug information handout for each medication shipped and additional FDA Medication Guides as required.         REFERRAL TO PHARMACIST     Referral to the pharmacist: Not needed      [facility]     Shipping address confirmed in Epic.     Delivery Scheduled: Yes, Expected medication delivery date: 05/03/22.     Medication will be delivered via Same Day Courier to the prescription address in Epic WAM.    Arnold Long, PharmD   [facility] Specialty Pharmacist

## 2022-05-03 MED FILL — REPATHA SURECLICK 140 MG/ML SUBCUTANEOUS PEN INJECTOR: SUBCUTANEOUS | 28 days supply | Qty: 2 | Fill #8

## 2022-05-12 DIAGNOSIS — Z1211 Encounter for screening for malignant neoplasm of colon: Principal | ICD-10-CM

## 2022-05-12 DIAGNOSIS — K5900 Constipation, unspecified: Principal | ICD-10-CM

## 2022-05-24 NOTE — Unmapped (Signed)
[facility] Pharm[facility] Refill Coordination Note    Specialty Lite Medication(s) to be Shipped:   General Specialty: Repatha    Other medication(s) to be shipped: No additional medications requested for fill at this time     George Moore, DOB: 1955/05/04  Phone: [phone] (home)       All above HIPAA information was verified with patient.     Was a Nurse, learning disability used for this call? No    Changes to medications: [name] reports no changes at this time.  Changes to insurance: No      REFERRAL TO PHARMACIST     Referral to the pharmacist: Not needed      [facility]     Shipping address confirmed in Epic.     Delivery Scheduled: Yes, Expected medication delivery date: 06/01/22.     Medication will be delivered via Same Day Courier to the prescription address in Epic WAM.    [name]   [facility] Specialty Technician

## 2022-05-27 DIAGNOSIS — E291 Testicular hypofunction: Principal | ICD-10-CM

## 2022-05-27 DIAGNOSIS — I10 Essential (primary) hypertension: Principal | ICD-10-CM

## 2022-05-27 MED ORDER — CARVEDILOL 25 MG TABLET
ORAL_TABLET | Freq: Two times a day (BID) | ORAL | 0 refills | 90 days | Status: CP
Start: 2022-05-27 — End: ?

## 2022-05-27 NOTE — Unmapped (Signed)
[facility] Refill, also sent message needs follow up in June with Cassie.

## 2022-06-01 MED FILL — REPATHA SURECLICK 140 MG/ML SUBCUTANEOUS PEN INJECTOR: SUBCUTANEOUS | 28 days supply | Qty: 2 | Fill #9

## 2022-06-08 LAB — CBC W/ AUTO DIFF
BASOPHILS ABSOLUTE COUNT: 0.1 10*9/L (ref 0.0–0.1)
BASOPHILS RELATIVE PERCENT: 0.8 %
EOSINOPHILS ABSOLUTE COUNT: 0.3 10*9/L (ref 0.0–0.5)
EOSINOPHILS RELATIVE PERCENT: 2.4 %
HEMATOCRIT: 42.6 % (ref 39.0–48.0)
HEMOGLOBIN: 14.7 g/dL (ref 12.9–16.5)
LYMPHOCYTES ABSOLUTE COUNT: 1.9 10*9/L (ref 1.1–3.6)
LYMPHOCYTES RELATIVE PERCENT: 16.6 %
MEAN CORPUSCULAR HEMOGLOBIN CONC: 34.4 g/dL (ref 32.0–36.0)
MEAN CORPUSCULAR HEMOGLOBIN: 28.8 pg (ref 25.9–32.4)
MEAN CORPUSCULAR VOLUME: 83.7 fL (ref 77.6–95.7)
MEAN PLATELET VOLUME: 8.8 fL (ref 6.8–10.7)
MONOCYTES ABSOLUTE COUNT: 1.1 10*9/L — ABNORMAL HIGH (ref 0.3–0.8)
MONOCYTES RELATIVE PERCENT: 9.5 %
NEUTROPHILS ABSOLUTE COUNT: 8 10*9/L — ABNORMAL HIGH (ref 1.8–7.8)
NEUTROPHILS RELATIVE PERCENT: 70.7 %
NUCLEATED RED BLOOD CELLS: 0 /100{WBCs} (ref <=4)
PLATELET COUNT: 154 10*9/L (ref 150–450)
RED BLOOD CELL COUNT: 5.09 10*12/L (ref 4.26–5.60)
RED CELL DISTRIBUTION WIDTH: 14 % (ref 12.2–15.2)
WBC ADJUSTED: 11.3 10*9/L — ABNORMAL HIGH (ref 3.6–11.2)

## 2022-06-08 LAB — URINALYSIS WITH MICROSCOPY WITH CULTURE REFLEX
BACTERIA: NONE SEEN /HPF
BILIRUBIN UA: NEGATIVE
BLOOD UA: NEGATIVE
GLUCOSE UA: >1000 — AB
KETONES UA: NEGATIVE
LEUKOCYTE ESTERASE UA: NEGATIVE
NITRITE UA: NEGATIVE
PH UA: 5.5 (ref 5.0–9.0)
PROTEIN UA: NEGATIVE
RBC UA: <1 /HPF (ref <=3)
SPECIFIC GRAVITY UA: 1.034 — ABNORMAL HIGH (ref 1.003–1.030)
SQUAMOUS EPITHELIAL: <1 /HPF (ref 0–5)
UROBILINOGEN UA: <2
WBC UA: <1 /HPF (ref <=2)

## 2022-06-08 LAB — COMPREHENSIVE METABOLIC PANEL
ALBUMIN: 4.3 g/dL (ref 3.4–5.0)
ALKALINE PHOSPHATASE: 83 U/L (ref 46–116)
ALT (SGPT): 39 U/L (ref 10–49)
ANION GAP: 7 mmol/L (ref 5–14)
AST (SGOT): 20 U/L (ref <=34)
BILIRUBIN TOTAL: 2.7 mg/dL — ABNORMAL HIGH (ref 0.3–1.2)
BLOOD UREA NITROGEN: 15 mg/dL (ref 9–23)
BUN / CREAT RATIO: 15
CALCIUM: 10.1 mg/dL (ref 8.7–10.4)
CHLORIDE: 105 mmol/L (ref 98–107)
CO2: 28.4 mmol/L (ref 20.0–31.0)
CREATININE: 1.02 mg/dL
EGFR CKD-EPI (2021) MALE: 82 mL/min/{1.73_m2} (ref >=60)
GLUCOSE RANDOM: 190 mg/dL — ABNORMAL HIGH (ref 70–179)
POTASSIUM: 3.4 mmol/L (ref 3.4–4.8)
PROTEIN TOTAL: 7.7 g/dL (ref 5.7–8.2)
SODIUM: 140 mmol/L (ref 135–145)

## 2022-06-08 LAB — LIPASE: LIPASE: 59 U/L — ABNORMAL HIGH (ref 12–53)

## 2022-06-09 ENCOUNTER — Ambulatory Visit: Admit: 2022-06-09 | Discharge: 2022-06-10 | Payer: PRIVATE HEALTH INSURANCE

## 2022-06-09 LAB — CBC W/ AUTO DIFF
BASOPHILS ABSOLUTE COUNT: 0.1 10*9/L (ref 0.0–0.1)
BASOPHILS RELATIVE PERCENT: 0.6 %
EOSINOPHILS ABSOLUTE COUNT: 0.3 10*9/L (ref 0.0–0.5)
EOSINOPHILS RELATIVE PERCENT: 2.9 %
HEMATOCRIT: 41.3 % (ref 39.0–48.0)
HEMOGLOBIN: 14.2 g/dL (ref 12.9–16.5)
LYMPHOCYTES ABSOLUTE COUNT: 2.1 10*9/L (ref 1.1–3.6)
LYMPHOCYTES RELATIVE PERCENT: 20.4 %
MEAN CORPUSCULAR HEMOGLOBIN CONC: 34.3 g/dL (ref 32.0–36.0)
MEAN CORPUSCULAR HEMOGLOBIN: 29 pg (ref 25.9–32.4)
MEAN CORPUSCULAR VOLUME: 84.6 fL (ref 77.6–95.7)
MEAN PLATELET VOLUME: 8.8 fL (ref 6.8–10.7)
MONOCYTES ABSOLUTE COUNT: 1.1 10*9/L — ABNORMAL HIGH (ref 0.3–0.8)
MONOCYTES RELATIVE PERCENT: 10.4 %
NEUTROPHILS ABSOLUTE COUNT: 6.8 10*9/L (ref 1.8–7.8)
NEUTROPHILS RELATIVE PERCENT: 65.7 %
NUCLEATED RED BLOOD CELLS: 0 /100{WBCs} (ref <=4)
PLATELET COUNT: 142 10*9/L — ABNORMAL LOW (ref 150–450)
RED BLOOD CELL COUNT: 4.88 10*12/L (ref 4.26–5.60)
RED CELL DISTRIBUTION WIDTH: 14 % (ref 12.2–15.2)
WBC ADJUSTED: 10.3 10*9/L (ref 3.6–11.2)

## 2022-06-09 LAB — COMPREHENSIVE METABOLIC PANEL
ALBUMIN: 4.3 g/dL (ref 3.4–5.0)
ALKALINE PHOSPHATASE: 85 U/L (ref 46–116)
ALT (SGPT): 37 U/L (ref 10–49)
ANION GAP: 8 mmol/L (ref 5–14)
AST (SGOT): 20 U/L (ref <=34)
BILIRUBIN TOTAL: 4.2 mg/dL — ABNORMAL HIGH (ref 0.3–1.2)
BLOOD UREA NITROGEN: 14 mg/dL (ref 9–23)
BUN / CREAT RATIO: 14
CALCIUM: 10.1 mg/dL (ref 8.7–10.4)
CHLORIDE: 104 mmol/L (ref 98–107)
CO2: 30.3 mmol/L (ref 20.0–31.0)
CREATININE: 1 mg/dL
EGFR CKD-EPI (2021) MALE: 84 mL/min/{1.73_m2} (ref >=60)
GLUCOSE RANDOM: 143 mg/dL (ref 70–179)
POTASSIUM: 3.3 mmol/L — ABNORMAL LOW (ref 3.4–4.8)
PROTEIN TOTAL: 8.1 g/dL (ref 5.7–8.2)
SODIUM: 142 mmol/L (ref 135–145)

## 2022-06-09 LAB — APTT
APTT: 27.1 s (ref 24.8–38.4)
HEPARIN CORRELATION: 0.2

## 2022-06-09 LAB — CBC
HEMATOCRIT: 43.9 % (ref 39.0–48.0)
HEMOGLOBIN: 15 g/dL (ref 12.9–16.5)
MEAN CORPUSCULAR HEMOGLOBIN CONC: 34.3 g/dL (ref 32.0–36.0)
MEAN CORPUSCULAR HEMOGLOBIN: 29 pg (ref 25.9–32.4)
MEAN CORPUSCULAR VOLUME: 84.6 fL (ref 77.6–95.7)
MEAN PLATELET VOLUME: 8.9 fL (ref 6.8–10.7)
PLATELET COUNT: 157 10*9/L (ref 150–450)
RED BLOOD CELL COUNT: 5.18 10*12/L (ref 4.26–5.60)
RED CELL DISTRIBUTION WIDTH: 13.9 % (ref 12.2–15.2)
WBC ADJUSTED: 9.1 10*9/L (ref 3.6–11.2)

## 2022-06-09 LAB — BILIRUBIN, DIRECT: BILIRUBIN DIRECT: 0.4 mg/dL — ABNORMAL HIGH (ref 0.00–0.30)

## 2022-06-09 LAB — PROTIME-INR
INR: 1.01
PROTIME: 11.2 s (ref 9.9–12.6)

## 2022-06-09 MED ADMIN — potassium chloride ER tablet 40 mEq: 40 meq | ORAL | @ 23:00:00 | Stop: 2022-06-09

## 2022-06-09 MED ADMIN — iohexol (OMNIPAQUE) 350 mg iodine/mL solution 100 mL: 100 mL | INTRAVENOUS | @ 10:00:00 | Stop: 2022-06-09

## 2022-06-09 NOTE — Unmapped (Signed)
Emergency Department Provider Note      ED Assessment/Plan     George Moore is a pleasant 67 y.o. male w/ hypertension, HLD, diabetes, and OSA who presents for bloody stools.      MDM: Patient to be admitted for lower GI bleed with Oaklyn score of 10.  Hemoglobin delta of -0.5 over 5 hours.  Initially, imaging was not ordered as patient had a nontender abdominal exam and no significant, after this finding patient was sigmoid colitis on telemetry read.  Given patient's persistent hematochezia as well as acute hemoglobin I do feel the patient warrants GI evaluation.  Patient is not anticoagulated.  Differential diagnosis includes colonic polyp/cancer, internal hemorrhoid, AVM, as well as colitis/diarrhea. Less likley aorticenteric fistula given lck of previous aortic surgery.      Discussion of Management with other Physicians, QHP, or Appropriate Source: Discussion with other professionals: Admitting team -see ED course  External Records Reviewed: Cardiology clinic note from 08/11/2021 for PMH    Diagnostic workup as below.     Orders Placed This Encounter   Procedures   ??? XR Abdomen 1 View   ??? CBC w/ Differential   ??? Comprehensive Metabolic Panel   ??? Lipase Level   ??? Urinalysis with Microscopy with Culture Reflex       Will reassess as we get results and update below    ED Course as of 06/09/22 0838   Thu Jun 09, 2022   0400 MAO paged for admission   0503 I spoke with the admitting team who recommends CTA GI bleed protocol.  They do not space currently on her team, however, may have a bed opened up shortly.  They will follow-up the CT scan and determine whether the patient can stay at [facility] or requires transfer to [facility]       ____________________________________________    The case was discussed with the attending physician, who is in agreement with the above assessment and plan.     History     Chief Complaint   Patient presents with   ??? Blood in Stool       HPI: George Moore is a pleasant 67 y.o. male w/ HTN, HLD, DM, and OSA who presents for one of bloody stools,. Patient reports 2 weeks ago he had an episode of acute onset abdominal spasm, nausea, and 1 episode of nonbloody nonbilious emesis which was followed 20 minutes later by bright red bloody stools.  He has been asymptomatic for the last 2 weeks until today when he developed recurrence of the symptoms.  Since then he has had acute 20-minute spasms of his abdomen which are then followed by bright red bloody stools.  Patient denies blood thinner use.  He is due for colonoscopy.  He states his last colonoscopy he had several polyps removed.  Denies weight loss but does endorse intermittent night sweats for the last 4 to 5 years.    Allergies:   Allergies   Allergen Reactions   ??? Amoxicillin Rash       Social:   Social History     Tobacco Use   ??? Smoking status: Never   ??? Smokeless tobacco: Former   Substance Use Topics   ??? Alcohol use: Yes     Comment: drinks once a blue moon   ??? Drug use: No       Physical Exam      General: Awake, alert, in no distress  HEENT: Head atraumatic. Moist  mucous membranes. No scleral icterus or conjunctivitis.    Cardiac: RRR.   Pulmonary: Breathing comfortably w/ no accessory muscle usage. Clear to auscultation bilaterally.    Abdominal: Soft, nontender, nondistended abdomen.    Rectal: Performed with nurse chaperone.  Normal external anus.  No palpable masses or internal hemorrhoids.  Bright red blood within the rectal vault  MSK: No lower extremity edema.    Dermatologic: No jaundice or pallor.    Neurologic: Alert. Follows commands. Moving all extremities spontaneously.    Psychiatric: Normal mood and behavior.      Radiology     XR Abdomen 1 View    (Results Pending)       Labs     Labs Reviewed   COMPREHENSIVE METABOLIC PANEL - Abnormal; Notable for the following components:       Result Value    Glucose 190 (*)     Total Bilirubin 2.7 (*)     All other components within normal limits   LIPASE - Abnormal; Notable for the following components:    Lipase 59 (*)     All other components within normal limits   URINALYSIS WITH MICROSCOPY WITH CULTURE REFLEX - Abnormal; Notable for the following components:    Specific Gravity, UA 1.034 (*)     Glucose, UA >1000 mg/dL (*)     All other components within normal limits   CBC W/ AUTO DIFF - Abnormal; Notable for the following components:    WBC 11.3 (*)     Absolute Neutrophils 8.0 (*)     Absolute Monocytes 1.1 (*)     All other components within normal limits   CBC W/ DIFFERENTIAL    Narrative:     The following orders were created for panel order CBC w/ Differential.  Procedure                               Abnormality         Status                     ---------                               -----------         ------                     CBC w/ [id number]]         Abnormal            Final result                 Please view results for these tests on the individual orders.        Elizabeth Palau, PGY-2  [facility] Emergency Medicine  Pager: [phone]                    Binnie Rail, MD  Resident  06/09/22 [phone]

## 2022-06-09 NOTE — Unmapped (Signed)
[facility] Medicine   History and Physical       Assessment and Plan     George Moore is a 67 y.o. male who is presenting to [facility] with Hematochezia, in the setting of the following pertinent/contributing co-morbidities: HTN, T2DM.      Hematochezia: Illness that poses a threat to life or bodily function without appropriate treatment. VSS and hemoglobin stable on recheck. CTA with signs of colitis. 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 Ddx includes infectious colitis, IBD (less likely given no prior issues). CTA w/o read of mesenteric vessel narrowing to suggest ischemic bowel.  - CTM for bleeding overnight, Q8H CBC  - Discussed with GI, order for colonoscopy placed in discharge tab    - GI arranging for outpatient colonoscopy and follow up    - Would confirm with them that this is done prior to discharge    Bilirubinemia: Isolated bilirubin elevation. Unclear etiology. Ddx is wide, including Gilbert's, drug-reaction, and less likely hemolysis  - Fractionate bilirubin  - Recheck in am    Secondary/Additional Active Problems:    HTN: Hold home carvedilol, felodipine, losartan-HCTZ while monitoring bleed    T2DM: Hold jardiance. SSI while admitted.    Prophylaxis  -Not indicated, low risk and GIB    Diet  - Regular diet    Code Status / HCDM  - Full code, Discussed with patient at the time of admission       HPI      George Moore is a 67 y.o. male who is presenting to [facility] with Hematochezia.    [name] presents with a one day history of hematochezia that occurs in the setting of chronic, intermittent diarrhea/vomiting. He has had a several year history (which he really identifies as starting after he got COVID in 2021) of intermittent episodes of concurrent vomiting/diarrhea. Between these episodes, he has frequent periods of constipation where he produces rabbit pellet like stools. The episodes of vomiting/diarrhea will come on suddenly and unpredictably.     He had one of his usual episodes two weeks ago, however, that time, there was blood mixed in with his stool. That resolved, and he continued to have his usual hard stools. The night before presentation he had another episodes of vomiting and diarrhea, and again had blood in his stool. He also developed lower abdominal discomfort that felt like urgency to pass stool, though he did not always do so when trying to do so. He continued to have blood in his stool which he said looked like dark red clots, prompting presentation to the ED where he reports having episodes every 10-20 minutes overnight. They slowed down in the morning, and he had his last episode about 5 hours before my evaluation. He denies any fevers or night sweats, and has not had any recent sick contacts.    ER course was notable for normal and stable vital signs. CTA did not show bleeding, but did show signs of colon inflammation. His hemoglobin decreased from 14.7 to 14.2, prompting request for admission.      Med Rec Confidence   I reviewed the Medication List. The current list is Accurate    Physical Exam   Temp:  [35.6 ??C (96.1 ??F)-37 ??C (98.6 ??F)] 35.6 ??C (96.1 ??F)  Heart Rate:  [65-73] 69  SpO2 Pulse:  [65] 65  Resp:  [16-20] 18  BP: (130-157)/(68-78) 157/78  SpO2:  [98 %-100 %] 100 %  Body mass  index is 32.62 kg/m??.    General: Well-appearing, well-nourished, in no acute distress  HEENT: Moist mucous membranes, EOMI, clear conjunctiva, sclera anicteric  Cardiovascular: Regular rate and rhythm, S1/S2 present, no gallops or murmurs. 2+ radial pulses bilaterally.  Pulmonary: No crackles or wheezes, good air movement throughout, no use of accessory muscles at room air  Abdomen: Soft, NTND  Extremities: No edema or cyanosis  Skin: Warm and well perfused  Neuro: Alert and appropriate, no gross deficits  Psych: Appropriate mood and affect

## 2022-06-09 NOTE — Unmapped (Signed)
Pt c/o bright red blood and clots with bowel movement that started today. Pt admitted to last normal bowel movement prior was in weeks.

## 2022-06-10 LAB — CBC
HEMATOCRIT: 39.4 % (ref 39.0–48.0)
HEMOGLOBIN: 13.6 g/dL (ref 12.9–16.5)
MEAN CORPUSCULAR HEMOGLOBIN CONC: 34.5 g/dL (ref 32.0–36.0)
MEAN CORPUSCULAR HEMOGLOBIN: 28.7 pg (ref 25.9–32.4)
MEAN CORPUSCULAR VOLUME: 83.2 fL (ref 77.6–95.7)
MEAN PLATELET VOLUME: 9 fL (ref 6.8–10.7)
PLATELET COUNT: 136 10*9/L — ABNORMAL LOW (ref 150–450)
RED BLOOD CELL COUNT: 4.73 10*12/L (ref 4.26–5.60)
RED CELL DISTRIBUTION WIDTH: 13.8 % (ref 12.2–15.2)
WBC ADJUSTED: 8.2 10*9/L (ref 3.6–11.2)

## 2022-06-10 LAB — COMPREHENSIVE METABOLIC PANEL
ALBUMIN: 3.7 g/dL (ref 3.4–5.0)
ALKALINE PHOSPHATASE: 79 U/L (ref 46–116)
ALT (SGPT): 34 U/L (ref 10–49)
ANION GAP: 9 mmol/L (ref 5–14)
AST (SGOT): 14 U/L (ref <=34)
BILIRUBIN TOTAL: 3.3 mg/dL — ABNORMAL HIGH (ref 0.3–1.2)
BLOOD UREA NITROGEN: 17 mg/dL (ref 9–23)
BUN / CREAT RATIO: 18
CALCIUM: 9.4 mg/dL (ref 8.7–10.4)
CHLORIDE: 106 mmol/L (ref 98–107)
CO2: 25.9 mmol/L (ref 20.0–31.0)
CREATININE: 0.92 mg/dL
EGFR CKD-EPI (2021) MALE: >90 mL/min/{1.73_m2} (ref >=60)
GLUCOSE RANDOM: 207 mg/dL — ABNORMAL HIGH (ref 70–179)
POTASSIUM: 3.7 mmol/L (ref 3.4–4.8)
PROTEIN TOTAL: 6.9 g/dL (ref 5.7–8.2)
SODIUM: 141 mmol/L (ref 135–145)

## 2022-06-10 LAB — HAPTOGLOBIN: HAPTOGLOBIN: 127 mg/dL (ref 30–200)

## 2022-06-10 LAB — LACTATE DEHYDROGENASE: LACTATE DEHYDROGENASE: 152 U/L (ref 120–246)

## 2022-06-10 LAB — RETICULOCYTES
RETICULOCYTE ABSOLUTE COUNT: 83.2 10*9/L (ref 23.0–100.0)
RETICULOCYTE COUNT PCT: 1.76 % (ref 0.50–2.17)

## 2022-06-10 LAB — C-REACTIVE PROTEIN: C-REACTIVE PROTEIN: 26 mg/L — ABNORMAL HIGH (ref <=10.0)

## 2022-06-10 MED ORDER — PEG 3350-ELECTROLYTES 236 GRAM-22.74 GRAM-6.74 GRAM-5.86 GRAM SOLUTION
0 refills | 0.00000 days | Status: CP
Start: 2022-06-10 — End: 2022-06-10
  Filled 2022-06-10: qty 4000, 1d supply, fill #0

## 2022-06-10 MED ADMIN — insulin lispro (HumaLOG) injection 0-20 Units: 0-20 [IU] | SUBCUTANEOUS | @ 11:00:00 | Stop: 2022-06-10

## 2022-06-10 NOTE — Unmapped (Signed)
Problem: Adult Inpatient Plan of Care  Goal: Plan of Care Review  Outcome: Discharged to Home  Goal: Patient-Specific Goal (Individualized)  Outcome: Discharged to Home  Goal: Absence of Hospital-Acquired Illness or Injury  Outcome: Discharged to Home  Goal: Optimal Comfort and Wellbeing  Outcome: Discharged to Home  Goal: Readiness for Transition of Care  Outcome: Discharged to Home  Goal: Rounds/Family Conference  Outcome: Discharged to Home

## 2022-06-10 NOTE — Unmapped (Signed)
Patient admitted due to Lower GI Bleed / Bright red blood in stools with clots, noted alert & Oriented x 4, VSS, denies pain and discomfort.  Blood sugar monitored w/ no intervention required this shift, patient tolerating oral intake well.  Small bright red clotted blood produced rectally x 1 since admission.  No distress noted    12 hour chart check completed.    Problem: Gastrointestinal Bleeding  Goal: Optimal Coping with Acute Illness  Outcome: Ongoing - Unchanged  Goal: Hemostasis  Outcome: Ongoing - Unchanged     Problem: Adult Inpatient Plan of Care  Goal: Plan of Care Review  Outcome: Progressing    Goal: Patient-Specific Goal (Individualized)  Outcome: Progressing  Goal: Absence of Hospital-Acquired Illness or Injury  Outcome: Progressing  Intervention: Identify and Manage Fall Risk    Goal: Optimal Comfort and Wellbeing  Outcome: Progressing     Problem: Comorbidity Management  Goal: Blood Glucose Levels Within Targeted Range  Outcome: Progressing  Goal: Blood Pressure in Desired Range  Outcome: Progressing

## 2022-06-10 NOTE — Unmapped (Signed)
Colonoscopy  Procedure #1     Procedure #2   [id number]  MRN     Endoscopist     Is the patient's health insurance [address], [facility]), or [employer]?     Urgent procedure     Are you pregnant?     Are you in the process of scheduling or awaiting results of a heart ultrasound, stress test, or catheterization to evaluate new or worsening chest pain, dizziness, or shortness of breath?     Do you take: Plavix (clopidogrel), Coumadin (warfarin), Lovenox (enoxaparin), Pradaxa (dabigatran), Effient (prasugrel), Xarelto (rivaroxaban), Eliquis (apixaban), Pletal (cilostazol), or Brilinta (ticagrelor)?          Which of the above medications are you taking?          What is the name of the medical practice that manages this medication?          What is the name of the medical provider who manages this medication?     Do you have hemophilia, von Willebrand disease, or low platelets?     Do you have a pacemaker or implanted cardiac defibrillator?     Has a [facility] provider specified the location(s)?     Which location(s) did the [facility] provider specify?        [facility]        [place]        [facility]        [facility]     Is procedure indication for variceal banding (this does NOT include variceal screening)?     Have you had a heart attack, stroke or heart stent placement within the past 6 months?     Month of event     Year of event (ONLY ENTER LAST 2 DIGITS)        5  Height (feet)   8  Height (inches)   215  Weight (pounds)   32.7  BMI          Did the ordering provider specify a bowel prep?          What bowel prep was specified?     Do you have chronic kidney disease?     Do you have chronic constipation or have you had poor quality bowel preps for past colonoscopies?     Do you have Crohn's disease or ulcerative colitis?     Have you had weight loss surgery?          When you walk around your house or grocery store, do you have to stop and rest due to shortness of breath, chest pain, or light-headedness?     Do you ever use supplemental oxygen?     Have you been hospitalized for cirrhosis of the liver or heart failure in the last 12 months?     Have you been treated for mouth or throat cancer with radiation or surgery?     Have you been told that it is difficult for doctors to insert a breathing tube in you during anesthesia?     Have you had a heart or lung transplant?          Are you on dialysis?     Do you have cirrhosis of the liver?     Do you have myasthenia gravis?     Is the patient a prisoner?        TRUE  Have you been diagnosed with sleep apnea or do you wear a  CPAP machine at night?     Are you younger than 30?     Have you previously received propofol sedation administered by an anesthesiologist for a GI procedure?     Do you drink an average of more than 3 drinks of alcohol per day?     Do you regularly take suboxone or any prescription medications for chronic pain?     Do you regularly take Ativan, Klonopin, Xanax, Valium, lorazepam, clonazepam, alprazolam, or diazepam?     Have you previously had difficulty with sedation during a GI procedure?     Have you been diagnosed with PTSD?     Are you allergic to fentanyl or midazolam (Versed)?     Do you take medications for HIV?   ################# ## ###################################################################################################################   MRN:          [id number]   Anticoag Review:  No   Nurse Triage:  No   GI Clinic Consult:  No   Procedure(s):  Colonoscopy     0   Location[facility]s):  [facility]     [facility]     [place]        Endoscopist:  0   Urgent:            No   Prep:               [place]                  ################# ## ###################################################################################################################

## 2022-06-10 NOTE — Unmapped (Signed)
Physician Discharge Summary [facility]  4 BT[address] [facility]  [address]  [place] [address]  Dept: [phone]  Loc: [phone]     Identifying Information:   George Moore  18-Jul-1955  [id number]    Primary Care Physician: Lynford Citizen, MD   Code Status: Full Code    Admit Date: 06/09/2022    Discharge Date: 06/10/2022     Discharge To: Home    Discharge Service: [facility]BR - HBC: Hospitalist Service #2     Discharge Attending Physician: Arman Filter, MD    Discharge Diagnoses:  Principal Problem:    Hematochezia (POA: Yes)  Resolved Problems:    * No resolved hospital problems. *      Outpatient Provider Follow Up Issues:   [  ] colonoscopy on Tues 06/14/22    Hospital Course:   George Moore is a 67 year old man with history of HTN, T2DM who presented with a one-day history of hematochezia. He has a several year history of episodes of intermittent vomiting/diarrhea interspersed with periods of constipation. He developed vomiting and diarrhea with bright red blood the day prior to presentation, with associated abdominal cramping. He continued to have multiple episodes of small volume red blood per rectum and presented to ED. He was hemodynamically stable, and labs were notable for normal Hgb 14.7, which did not significantly decrease on serial checks. CTA GI bleed protocol was negative for acute bleed but did show signs of segmental colitis in the sigmoid and descending colon. 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 GI pathogen panel was negative, so infectious colitis felt less likely. CRP was elevated to 26, although WBC was normal. Overall etiology of his colitis and hematochezia remains uncertain. GI was contacted and coordinated close outpatient colonoscopy on 06/14/22 for further workup.     Procedures:  None  No admission procedures for hospital encounter.  ______________________________________________________________________  Discharge Medications:     Your Medication List        CHANGE how you take these medications      JARDIANCE 10 mg tablet  Generic drug: empagliflozin  Take 1 tablet (10 mg total) by mouth daily.  What changed:   how much to take  how to take this  when to take this            CONTINUE taking these medications      carvedilol 25 MG tablet  Commonly known as: COREG  Take 1 tablet (25 mg total) by mouth in the morning and 1 tablet (25 mg total) in the evening. Take with meals.     CONTOUR NEXT TEST STRIPS Strp  Generic drug: blood sugar diagnostic     empty container Misc  Commonly known as: SHARPS-A-GATOR DISPOSAL SYSTEM  Use as directed for sharps disposal     felodipine 10 MG 24 hr tablet  Commonly known as: PLENDIL  Take 1 tablet (10 mg total) by mouth daily.     losartan-hydroCHLOROthiazide 100-12.5 mg per tablet  Commonly known as: HYZAAR  Take 1 tablet by mouth daily.     polyethylene glycol 17 gram/dose powder  Commonly known as: GLYCOLAX     REPATHA SURECLICK 140 mg/mL Pnij  Generic drug: evolocumab  Inject the contents of one pen (140 mg) under the skin every fourteen (14) days.     TRUEPLUS PEN NEEDLE 31 gauge x 3/16 (  5 mm) Ndle  Generic drug: pen needle, diabetic     VICTOZA 3-PAK 0.6 mg/0.1 mL (18 mg/3 mL) injection  Generic drug: liraglutide              Allergies:  Amoxicillin  ______________________________________________________________________  Pending Test Results (if blank, then none):  Pending Labs       Order Current Status    GI Pathogen Panel In process            Most Recent Labs:  All lab results last 24 hours -   Recent Results (from the past 24 hour(s))   CBC    Collection Time: 06/09/22  3:26 PM   Result Value Ref Range    WBC 9.1 3.6 - 11.2 10*9/L    RBC 5.18 4.26 - 5.60 10*12/L    HGB 15.0 12.9 - 16.5 g/dL    HCT 16.1 09.6 - 04.5 %    MCV 84.6 77.6 - 95.7 fL    MCH 29.0 25.9 - 32.4 pg    MCHC 34.3 32.0 - 36.0 g/dL    RDW 40.9 81.1 - 91.4 %    MPV 8.9 6.8 - 10.7 fL    Platelet 157 150 - 450 10*9/L   PT-INR    Collection Time: 06/09/22  3:26 PM   Result Value Ref Range    PT 11.2 9.9 - 12.6 sec    INR 1.01    aPTT    Collection Time: 06/09/22  3:26 PM   Result Value Ref Range    APTT 27.1 24.8 - 38.4 sec    Heparin Correlation 0.2    Comprehensive Metabolic Panel    Collection Time: 06/09/22  3:26 PM   Result Value Ref Range    Sodium 142 135 - 145 mmol/L    Potassium 3.3 (L) 3.4 - 4.8 mmol/L    Chloride 104 98 - 107 mmol/L    CO2 30.3 20.0 - 31.0 mmol/L    Anion Gap 8 5 - 14 mmol/L    BUN 14 9 - 23 mg/dL    Creatinine 7.82 9.56 - 1.18 mg/dL    BUN/Creatinine Ratio 14     eGFR CKD-EPI (2021) Male 84 >=60 mL/min/1.67m2    Glucose 143 70 - 179 mg/dL    Calcium 21.3 8.7 - 08.6 mg/dL    Albumin 4.3 3.4 - 5.0 g/dL    Total Protein 8.1 5.7 - 8.2 g/dL    Total Bilirubin 4.2 (H) 0.3 - 1.2 mg/dL    AST 20 <=57 U/L    ALT 37 10 - 49 U/L    Alkaline Phosphatase 85 46 - 116 U/L   Bilirubin, Direct    Collection Time: 06/09/22  3:26 PM   Result Value Ref Range    Bilirubin, Direct 0.40 (H) 0.00 - 0.30 mg/dL   POCT Glucose    Collection Time: 06/09/22  4:48 PM   Result Value Ref Range    Glucose, POC 150 70 - 179 mg/dL   POCT Glucose    Collection Time: 06/09/22  8:36 PM   Result Value Ref Range    Glucose, POC 170 70 - 179 mg/dL   Lactate dehydrogenase    Collection Time: 06/10/22 12:40 AM   Result Value Ref Range    LDH 152 120 - 246 U/L   Reticulocytes    Collection Time: 06/10/22 12:40 AM   Result Value Ref Range    Reticulocyte Auto % 1.76 0.50 - 2.17 %  Absolute Auto Reticulocyte 83.2 23.0 - 100.0 10*9/L   Haptoglobin    Collection Time: 06/10/22 12:40 AM   Result Value Ref Range    Haptoglobin 127 30 - 200 mg/dL   CBC    Collection Time: 06/10/22 12:40 AM   Result Value Ref Range    WBC 8.2 3.6 - 11.2 10*9/L    RBC 4.73 4.26 - 5.60 10*12/L    HGB 13.6 12.9 - 16.5 g/dL    HCT 16.1 09.6 - 04.5 %    MCV 83.2 77.6 - 95.7 fL MCH 28.7 25.9 - 32.4 pg    MCHC 34.5 32.0 - 36.0 g/dL    RDW 40.9 81.1 - 91.4 %    MPV 9.0 6.8 - 10.7 fL    Platelet 136 (L) 150 - 450 10*9/L   Comprehensive Metabolic Panel    Collection Time: 06/10/22  6:10 AM   Result Value Ref Range    Sodium 141 135 - 145 mmol/L    Potassium 3.7 3.4 - 4.8 mmol/L    Chloride 106 98 - 107 mmol/L    CO2 25.9 20.0 - 31.0 mmol/L    Anion Gap 9 5 - 14 mmol/L    BUN 17 9 - 23 mg/dL    Creatinine 7.82 9.56 - 1.18 mg/dL    BUN/Creatinine Ratio 18     eGFR CKD-EPI (2021) Male >90 >=60 mL/min/1.97m2    Glucose 207 (H) 70 - 179 mg/dL    Calcium 9.4 8.7 - 21.3 mg/dL    Albumin 3.7 3.4 - 5.0 g/dL    Total Protein 6.9 5.7 - 8.2 g/dL    Total Bilirubin 3.3 (H) 0.3 - 1.2 mg/dL    AST 14 <=08 U/L    ALT 34 10 - 49 U/L    Alkaline Phosphatase 79 46 - 116 U/L   POCT Glucose    Collection Time: 06/10/22  6:10 AM   Result Value Ref Range    Glucose, POC 217 (H) 70 - 179 mg/dL   C-reactive protein    Collection Time: 06/10/22  6:10 AM   Result Value Ref Range    CRP 26.0 (H) <=10.0 mg/L       Relevant Studies/Radiology (if blank, then none):  CTA Abdomen Pelvis Gi Bleed    Result Date: 06/09/2022  EXAM: CTA Abdomen and Pelvis WO and With contrast (GI Bleed protocol) DATE: 06/09/2022 ACCESSION: [id number] UN DICTATED: 06/09/2022 6:01 AM INTERPRETATION LOCATION: MAIN CAMPUS CLINICAL INDICATION: BRBPR  COMPARISON: None. TECHNIQUE: A spiral CT scan was obtained without IV contrast from the lung basesto the pubic symphysis. Images were reconstructed in the axial plane. Next, a spiral CTA scan was obtained with IV contrast from the lung bases to the pubic symphysis in both arterial and delayed venous phases. Images were reconstructed in the axial plane. Multiplanar reformatted and MIP images were provided for further evaluation of the vessels. For selected cases, 3D volume rendered images are also provided. VASCULAR FINDINGS: No evidence to suggest active contrast extravasation. Normal caliber of the abdominal aorta. Mild atherosclerotic disease of the aorta. Celiac: Extensive atherosclerotic disease. 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 Severe stenosis at the common hepatic artery origin. SMA: Patent Renal arteries: Patent IMA: Patent. Visualized inflow and outflow vessels are grossly unremarkable. Portal venous system is patent. IVC and hepatic veins are unremarkable. NON-VASCULAR FINDINGS: LOWER THORAX: Dependent atelectasis. Coronary artery calcifications. Small hiatal hernia.Marland Kitchen HEPATOBILIARY: No focal hepatic lesions. Cholelithiasis. No biliary dilatation.  SPLEEN: Splenomegaly measuring up  to 14.2 cm. PANCREAS: Unremarkable. ADRENALS: Unremarkable. KIDNEYS/URETERS: Symmetric nephrograms. No hydronephrosis. Subcentimeter renal lesions are too small to accurately characterize. Nonobstructing right-sided renal calculi. BLADDER: Unremarkable. PELVIC/REPRODUCTIVE ORGANS: Fat-containing left inguinal hernia. GI TRACT: Extensive thickening, mucosal hyperenhancement, and mesenteric edema involving the sigmoid colon and to a lesser extent the descending colon. No dilated loops of bowel. No findings to suggest active bleeding. PERITONEUM/RETROPERITONEUM AND MESENTERY: No free air. No rim-enhancing fluid collections. LYMPH NODES: No enlarged lymph nodes. BONES AND SOFT TISSUES: No aggressive osseous lesions. Multilevel degenerative disc disease.     -No findings to suggest active contrast extravasation. 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     XR Abdomen 1 View    Result Date: 06/09/2022  EXAM: XR ABDOMEN 1 VIEW ACCESSION: [id number] UN CLINICAL INDICATION: 67 years old with CONSTIPATION  COMPARISON: None TECHNIQUE: Supine view of the abdomen, 3 images(s) FINDINGS: Moderate colonic stool. No air-filled loops of small bowel are identified. No organomegaly. Subcentimeter calcification projects over the expected location of the inferior right kidney. Visualized lung bases are clear. Mild degenerative changes of the lower lumbar spine.     --Moderate colonic stool. Paucity of small bowel gas limits evaluation for obstruction. --Subcentimeter calcific density overlying the expected location of the inferior right kidney may represent renal calculus.    ______________________________________________________________________  Discharge Instructions:                 Appointments which have been scheduled for you      Jun 14, 2022  COLONOSCOPY, FLEXIBLE; WITH ENDOSCOPIC STENT PLACEMENT (INCLUDES PRE- AND POST-DILATION AND GUIDE WIRE PASSAGE, WHEN PERFORMED) with Kela Millin, MD  GI PERIOP [facility] REGION) [address]  [place] [address]  [phone]     Jul 28, 2022  8:30 AM  (Arrive by 8:15 AM)  Milana Huntsman with Leda Gauze, PA  [facility] GI MEDICINE [place] [facility] REGION) [address]  [facility] [address]  [place] [address]  [phone]        Aug 31, 2022  1:00 PM  (Arrive by 12:45 PM)  RETURN CARDIOLOGY with Claris Gower, AGNP  [facility] [address] [facility] REGION) [address]  [address]  [place] [address]  [phone]             ______________________________________________________________________  Discharge Day Services:  BP 147/71  - Pulse 76  - Temp 36.4 ??C (97.5 ??F) (Temporal)  - Resp 15  - Ht 172.7 cm (5' 7.99)  - Wt 97.3 kg (214 lb 8 oz)  - SpO2 94%  - BMI 32.62 kg/m??   Pt seen on the day of discharge and determined appropriate for discharge.    Condition at Discharge: good    Length of Discharge: I spent greater than 30 mins in the discharge of this patient.

## 2022-06-10 NOTE — Unmapped (Signed)
Pt admitted for GI bleed w/ imaging suggestive of colitis.  Order in for outpatient colonoscopy, GI consulted.  Hgb 15 to 13.6, team aware, will get another labs in am.  Pt A&Ox4, VSS, RA, independent.  Pt had several bloody/mucus bowel movements, small.  Pt in minor pain, denies any pharmaceutical needs.   Pt slept well overnight, call bell within reach, will continue to monitor.    Problem: Adult Inpatient Plan of Care  Goal: Plan of Care Review  Outcome: Progressing  Goal: Patient-Specific Goal (Individualized)  Outcome: Progressing  Goal: Absence of Hospital-Acquired Illness or Injury  Outcome: Progressing  Intervention: Identify and Manage Fall Risk  Recent Flowsheet Documentation  Taken 06/09/2022 2000 by Cherlynn Perches, RN  Safety Interventions:   bleeding precautions   commode/urinal/bedpan at bedside   lighting adjusted for tasks/safety   low bed   nonskid shoes/slippers when out of bed  Intervention: Prevent Skin Injury  Recent Flowsheet Documentation  Taken 06/10/2022 0400 by Cherlynn Perches, RN  Positioning for Skin: Supine/Back  Taken 06/10/2022 0200 by Cherlynn Perches, RN  Positioning for Skin: Supine/Back  Taken 06/10/2022 0000 by Cherlynn Perches, RN  Positioning for Skin: Supine/Back  Taken 06/09/2022 2200 by Cherlynn Perches, RN  Positioning for Skin: Sitting in Chair  Taken 06/09/2022 2000 by Cherlynn Perches, RN  Positioning for Skin: Supine/Back  Device Skin Pressure Protection:   absorbent pad utilized/changed   adhesive use limited   pressure points protected  Skin Protection:   adhesive use limited   incontinence pads utilized   protective footwear used   transparent dressing maintained   tubing/devices free from skin contact  Goal: Optimal Comfort and Wellbeing  Outcome: Progressing  Goal: Readiness for Transition of Care  Outcome: Progressing  Goal: Rounds/Family Conference  Outcome: Progressing     Problem: Comorbidity Management  Goal: Blood Glucose Levels Within Targeted Range  Outcome: Progressing  Goal: Blood Pressure in Desired Range  Outcome: Progressing     Problem: Gastrointestinal Bleeding  Goal: Optimal Coping with Acute Illness  Outcome: Progressing  Goal: Hemostasis  Outcome: Progressing

## 2022-06-14 ENCOUNTER — Ambulatory Visit: Admit: 2022-06-14 | Discharge: 2022-06-14 | Payer: PRIVATE HEALTH INSURANCE

## 2022-06-14 ENCOUNTER — Encounter: Admit: 2022-06-14 | Discharge: 2022-06-14 | Payer: PRIVATE HEALTH INSURANCE

## 2022-06-14 MED ADMIN — Propofol (DIPRIVAN) injection: INTRAVENOUS | @ 14:00:00 | Stop: 2022-06-14

## 2022-06-14 MED ADMIN — lactated Ringers infusion: 10 mL/h | INTRAVENOUS | @ 14:00:00 | Stop: 2022-06-14

## 2022-06-14 MED ADMIN — propofol (DIPRIVAN) infusion 10 mg/mL: INTRAVENOUS | @ 14:00:00 | Stop: 2022-06-14

## 2022-06-14 MED ADMIN — lidocaine (XYLOCAINE) 20 mg/mL (2 %) injection: INTRAVENOUS | @ 14:00:00 | Stop: 2022-06-14

## 2022-06-24 NOTE — Unmapped (Signed)
[facility] Refill Coordination Note    George Moore, DOB: 01/27/1956  Phone: [phone] (home)       All above HIPAA information was verified with patient.         06/24/2022     1:37 PM   Specialty Rx Medication Refill Questionnaire   Which Medications would you like refilled and shipped? Repatha   If medication refills are not needed at this time, please indicate the reason below. Na   Please list all current allergies: Emocicillan   Have you missed any doses in the last 30 days? No   Have you had any changes to your medication(s) since your last refill? No   How many days remaining of each medication do you have at home? None   If receiving an injectable medication, next injection date is 08[birth year]31-1957   Have you experienced any side effects in the last 30 days? No   Please enter the full address (street address, city, state, zip code) where you would like your medication(s) to be delivered to. [address] [place], [place]  [address]   Please specify on which day you would like your medication(s) to arrive. Note: if you need your medication(s) within 3 days, please call the pharmacy to schedule your order at [phone]  06/28/2022   Has your insurance changed since your last refill? No   Would you like a pharmacist to call you to discuss your medication(s)? No   Do you require a signature for your package? (Note: if we are billing Medicare Part B or your order contains a controlled substance, we will require a signature) No   Additional Comments: Have a Blessed day!         Completed refill call assessment today to schedule patient's medication shipment from the [facility] [id number]).  All relevant notes have been reviewed.       Confirmed patient received a Conservation officer, historic buildings and a Surveyor, mining with first shipment. The patient will receive a drug information handout for each medication shipped and additional FDA Medication Guides as required. REFERRAL TO PHARMACIST     Referral to the pharmacist: Not needed      [facility]     Shipping address confirmed in Epic.     Delivery Scheduled: Yes, Expected medication delivery date: 06/28/22.     Medication will be delivered via Same Day Courier to the prescription address in Epic WAM.    [name]   [facility] Specialty Technician

## 2022-06-28 MED FILL — REPATHA SURECLICK 140 MG/ML SUBCUTANEOUS PEN INJECTOR: SUBCUTANEOUS | 28 days supply | Qty: 2 | Fill #10

## 2022-07-20 NOTE — Unmapped (Signed)
[facility] Refill Coordination Note    Specialty Lite Medication(s) to be Shipped:   General Specialty: Repatha    Other medication(s) to be shipped: No additional medications requested for fill at this time     EUAL GUIDICE, DOB: [birth year]  Phone: [phone] (home)       All above HIPAA information was verified with patient.     Was a Nurse, learning disability used for this call? No    Changes to medications: Andrew reports no changes at this time.  Changes to insurance: No      REFERRAL TO PHARMACIST     Referral to the pharmacist: Not needed      [facility]     Shipping address confirmed in Epic.     Delivery Scheduled: Yes, Expected medication delivery date: 07/25/22.     Medication will be delivered via Same Day Courier to the prescription address in Epic WAM.    [name]   [facility] Specialty Technician

## 2022-07-25 MED FILL — REPATHA SURECLICK 140 MG/ML SUBCUTANEOUS PEN INJECTOR: SUBCUTANEOUS | 28 days supply | Qty: 2 | Fill #11

## 2022-07-28 ENCOUNTER — Ambulatory Visit
Admit: 2022-07-28 | Discharge: 2022-07-29 | Payer: PRIVATE HEALTH INSURANCE | Attending: Physician Assistant | Primary: Physician Assistant

## 2022-07-28 DIAGNOSIS — K5904 Chronic idiopathic constipation: Principal | ICD-10-CM

## 2022-07-28 DIAGNOSIS — Z0000 Encounter for general adult medical examination without abnormal findings: Principal | ICD-10-CM

## 2022-07-28 DIAGNOSIS — K5900 Constipation, unspecified: Principal | ICD-10-CM

## 2022-07-28 NOTE — Unmapped (Signed)
[facility] NEW PATIENT VISIT      REFERRING PROVIDER:  Lynford Citizen, MD  [address]  [facility] [address] [place],  [place] [address]    PRIMARY CARE PROVIDER:  Lynford Citizen, MD      PATIENT PROFILE:        George Moore is a 67 y.o. male (DOB: 08-16-1955) who has been referred by Dr. Richarda Blade for evaluation of constipation.    CHIEF COMPLAINT: Description of lifelong issues of constipation with description of going up to 1 to 2 weeks or longer without having a bowel movement.  No description of going longer without a bowel movement and having violent episodes of diarrhea large amount of stool evacuation.    HISTORY OF PRESENT ILLNESS: This is a 67 y.o. year old male with history of constipation which she states has been hurting since childhood with going up to a week without having a bowel movement and having a large amount evacuation when he is able to evacuate.  He occasionally will have harder stools requiring straining to defecate.  He denies any issues with significant evacuation when stools are softer.  He denies any issues with rectal incomplete evacuation.  He has a history of diverticuli internal and external hemorrhoids.  He also has a history of type 2 diabetes with hemoglobin A1c currently slightly below 8.  He describes going several weeks without having a bowel movement will have a blowout session of having large amounts of stool with rectal urgency with several large bowel movements that day.  He will then go back into a pattern of going up to several weeks without having a bowel movement with no significant urge to evacuate most of the time.  He will occasionally have harder stools that he will need to strain or to evacuate but these are very small in volume.  He does not like to take medications has been tried on various medications to control his blood sugars including metformin which he states he could not tolerate due to side effects.  He now is taking 2 cinnamon caplets daily in order to regulate his blood sugars.  He had been placed on Ozempic previously but found that this caused too many GI side effects and he stopped using the medication.  He will follow-up with his prescribing provider to discuss this further.  He states he tries to treat his constipation by consuming plenty of fluids and consuming a greater amount of fruits and vegetables that he continues to have a pattern of going several weeks without having a bowel movement with no urge to evacuate and then having a large amount of stools with rectal urgency which can be slightly loose to softly formed occasionally watery.  Last colonoscopy was in April 2024.  He states since that time he had had more issues with diarrhea but only recently has now gone back to a normal bowel habit with more formed stools that is less frequent such as up to once a week or every 2 weeks.  He denies any recent nausea or vomiting.  No fevers, chills, night sweats.     Problem List Items Addressed This Visit          Digestive    Chronic idiopathic constipation     Other Visit Diagnoses       Constipation, unspecified constipation type    -  Primary    Preventative health care  PAST MEDICAL HISTORY:    Past Medical History:   Diagnosis Date    Calculus of kidney     Diabetes mellitus (CMS-HCC)     Encounter for long-term (current) use of other medications     Hives     Hypertension     Hypertrophy of prostate with urinary obstruction and other lower urinary tract symptoms (LUTS)     Other testicular hypofunction     Renal colic        PAST SURGICAL HISTORY:    Past Surgical History:   Procedure Laterality Date    ARTHROSCOPY KNEE W/ DRILLING      EXTRACORPOREAL SHOCK WAVE LITHOTRIPSY Right 12/2007    PR COLONOSCOPY W/BIOPSY SINGLE/MULTIPLE N/A 06/14/2022    Procedure: COLONOSCOPY, FLEXIBLE, PROXIMAL TO SPLENIC FLEXURE; WITH BIOPSY, SINGLE OR MULTIPLE;  Surgeon: Kela Millin, MD;  [facility];  Service: Gastroenterology       MEDICATIONS:      Current Outpatient Medications:     carvedilol (COREG) 25 MG tablet, Take 1 tablet (25 mg total) by mouth in the morning and 1 tablet (25 mg total) in the evening. Take with meals., Disp: 180 tablet, Rfl: 0    CONTOUR NEXT TEST STRIPS Strp, , Disp: , Rfl:     empty container (SHARPS-A-GATOR DISPOSAL SYSTEM) Misc, Use as directed for sharps disposal, Disp: 1 each, Rfl: 2    evolocumab 140 mg/mL PnIj, Inject the contents of one pen (140 mg) under the skin every fourteen (14) days., Disp: 2 mL, Rfl: 11    felodipine (PLENDIL) 10 MG 24 hr tablet, Take 1 tablet (10 mg total) by mouth daily., Disp: , Rfl:     losartan-hydrochlorothiazide (HYZAAR) 100-12.5 mg per tablet, Take 1 tablet by mouth daily., Disp: 90 tablet, Rfl: 3    polyethylene glycol (GLYCOLAX) 17 gram/dose powder, , Disp: , Rfl:     polyethylene glycol (GOLYTELY) 236-22.74-6.74 gram solution, Take as directed per [facility] prep instructions, for split bowel prep., Disp: 4000 mL, Rfl: 0    TRUEPLUS PEN NEEDLE 31 gauge x 3/16 (5 mm) Ndle, , Disp: , Rfl:     JARDIANCE 10 mg tablet, Take 1 tablet (10 mg total) by mouth daily., Disp: , Rfl:     VICTOZA 3-PAK 0.6 mg/0.1 mL (18 mg/3 mL) injection, , Disp: , Rfl:   (Not in a hospital admission)      ALLERGIES:    Amoxicillin and Penicillin    SOCIAL HISTORY:    Social History     Socioeconomic History    Marital status: Married     Spouse name: None    Number of children: None    Years of education: None    Highest education level: None   Tobacco Use    Smoking status: Never    Smokeless tobacco: Former   Substance and Sexual Activity    Alcohol use: Yes     Comment: drinks once a blue moon    Drug use: No       FAMILY HISTORY:    Family History   Problem Relation Age of Onset    Nephrolithiasis Father     Stroke Father     Nephrolithiasis Sister     Stroke Mother     Hypertension Brother     Prostate cancer Neg Hx     Kidney disease Neg Hx     GU problems Neg Hx          VITAL  SIGNS:    BP 100/50  - Pulse 60  - Temp 35.9 ??C (96.6 ??F) (Temporal)  - Ht 172.7 cm (5' 8)  - Wt 97.1 kg (214 lb)  - SpO2 98%  - BMI 32.54 kg/m??     PHYSICAL EXAM:    Constitutional:   Alert, oriented x 3, no acute distress, well nourished, and well hydrated.   Mental Status:   Thought organized, appropriate affect, pleasantly interactive, not anxious appearing.               Abdomen: Soft, normal bowel sounds, non-distended, non-tender, no organomegaly or masses.     Perianal/Rectal Exam Not performed.                         DIAGNOSTIC STUDIES:  I have reviewed all pertinent diagnostic studies, including:    GI Procedures:    Colonoscopy April 2024 internal hemorrhoids diverticuli otherwise negative    Radiographic studies:    Reviewed in Epic    Laboratory results:    Last hemoglobin A1c slightly over 8, random complete metabolic panel March 2024 elevated glucose at 210 otherwise normal        ASSESSMENT:        Mr. Bhatti is a 67 year old gentleman with a history of type 2 diabetes, recent hemoglobin A1c slightly above 8, attempted metformin and other medications to lower his overall blood glucose but experience significant side effects including metformin and recently Ozempic.  Description of alternating did bowel habit with increased gas production and flatulence on occasion with description of loose stools when he is able to evacuate after passing more formed stools at the beginning of defecation.  Description of alternating bowel habit with possible build up and blow out pattern which has been ongoing since childhood.  We can first attempt to address the issues more conservatively with over-the-counter magnesium caplets from [place] starting with 1:59 at night before bed and increasing or decreasing depending on stool frequency, volume and texture.  If symptoms persist or worsen he did agree to complete a hydrogen breath test for small bowel material overgrowth which can be statistically more significant in diabetics especially those with uncontrolled blood sugars.  We did discuss the possible use of MiraLAX however concern related increase rectal urgency and change in stool texture to increase risk of fecal loss.  The skin we discussed further if additional stool softening is needed.  He will let me know in case there is any questions or issues with this.      PLAN:          I would suggest to consider a hydrogen breath test for small bowel bacterial overgrowth in the future if symptoms continue.  Hydrogen breath test for small intestinal bacterial overgrowth and intestinal methanogen overgrowth. Instructions for preparation prior to study provided. This is a breath test to evaluate for an overgrowth of normal bowel flora into the small intestines that can cause abdominal bloating, diarrhea, abdominal pain, constipation, nausea and reflux symptoms, etc. This study will be completed at [address] [place], [place], [address], 4th Floor GI clinic. The test will take approximately 2 hours to complete and you will be contacted with the results once completed. Please call the GI schedulers to schedule, [phone], option 1 then option 2.    2. You can start with 1-2 magnesium 500 mg caplets from [place], every night before bed or every day and then increase or decrease in dose  as needed depending on stool texture, frequency and volume. You can take up to 2,000 mg (4 caplets) daily or more, if needed. Magnesium is cumulative to itself, which means the longer you take it the softer and more frequent your stools may become. You may have to reduce the dose or reducing how often you take it (meaning skipping 1-2 days in between doses or taking it as needed, as long as the pattern is as consistent as possible to keep stools soft and evacuated without diarrhea or alternating between constipation and diarrhea).    3. You can add on Miralax if you think would help with stool texture and softness of the stool I would advise against having your stools really loose and soft. You may have to give the Miralax a couple of days before you notice a change to your stool texture.    4. Follow up in September-Oct 2024          I personally spent 65 minutes face-to-face and non-face-to-face in the care of this patient, which includes all pre, intra, and post visit time on the date of service.        Of note, patient's labs and clinic notes will now be released automatically to their MyChart for review. Patients will most likely receive their test results prior to the provider's ability to be able to review them. Please give at least 2-3 business days, so that I may have time to review the results, before expecting a response regarding the test results. This will be true for imaging studies, GI procedures, lab studies, etc. I appreciate your understanding and patience.     In regards to clinic notes, I will be as accurate as possible, but please understand that notes are generated through a Dragon dictation device and not all mistakes may be corrected before release.    Thank you for your understanding.    Trellis Paganini MPAS, PA-C        This dictation was done with [facility] Naturally Speaking. Document was reviewed but inadvertent errors in transcription may be present      Thank you for your referral to [facility]. As stated on the referral form that was completed at initiation of the referral of your patient; ???All new patients are seen for an initial consultation at the request of referring physicians. [facility] faculty will determine the need for transfer of care to [facility] at the time of initial consultation.??? Because our clinicians have limited clinic they are unable to provide walk-in or more urgent care in most cases. The patient will still need a local clinician to continue their care. [facility] does not accept ???transfer of care??? or a write in for ???consultation and treatment. Recommendations will be made to the patient and further care and clinic appointments will be discussed at the initial consultation. In the interim, the patient will continue to follow with his/her PCP and referring physician for general care and urgent matters. Management of current GI symptoms should also be done in coordination with PCP and/or referring GI physician.      Thank you.

## 2022-07-28 NOTE — Unmapped (Addendum)
I would suggest to consider a hydrogen breath test for small bowel bacterial overgrowth in the future if symptoms continue.  Hydrogen breath test for small intestinal bacterial overgrowth and intestinal methanogen overgrowth. Instructions for preparation prior to study provided. This is a breath test to evaluate for an overgrowth of normal bowel flora into the small intestines that can cause abdominal bloating, diarrhea, abdominal pain, constipation, nausea and reflux symptoms, etc. This study will be completed at [address] [place], [place], [address], 4th Floor GI clinic. The test will take approximately 2 hours to complete and you will be contacted with the results once completed. Please call the GI schedulers to schedule, [phone], option 1 then option 2.    2. You can start with 1-2 magnesium 500 mg caplets from [place], every night before bed or every day and then increase or decrease in dose as needed depending on stool texture, frequency and volume. You can take up to 2,000 mg (4 caplets) daily or more, if needed. Magnesium is cumulative to itself, which means the longer you take it the softer and more frequent your stools may become. You may have to reduce the dose or reducing how often you take it (meaning skipping 1-2 days in between doses or taking it as needed, as long as the pattern is as consistent as possible to keep stools soft and evacuated without diarrhea or alternating between constipation and diarrhea).    3. You can add on Miralax if you think would help with stool texture and softness of the stool I would advise against having your stools really loose and soft. You may have to give the Miralax a couple of days before you notice a change to your stool texture.    4. Follow up in September-Oct 2024      [facility] MPAS, PA-C  [email or web address]  Fax   [phone]   For test results, medication questions or other issues, nurse's line, Lytle Butte, RN, [phone] or [phone]      For questions regarding scheduling GI procedures, please call, [phone] (option 1 and then option 2 for scheduling GI procedures)    For questions regarding radiology appointments, or to schedule, [phone]    For clinic appointments, [phone].

## 2022-08-16 MED ORDER — REPATHA SURECLICK 140 MG/ML SUBCUTANEOUS PEN INJECTOR
SUBCUTANEOUS | 0 refills | 28 days | Status: CP
Start: 2022-08-16 — End: ?
  Filled 2022-08-22: qty 2, 28d supply, fill #0

## 2022-08-17 DIAGNOSIS — Z9189 Other specified personal risk factors, not elsewhere classified: Principal | ICD-10-CM

## 2022-08-17 DIAGNOSIS — E785 Hyperlipidemia, unspecified: Principal | ICD-10-CM

## 2022-08-18 NOTE — Unmapped (Signed)
George Moore 's Repatha shipment will be delayed as a result of prior authorization being required by the patient's insurance.     Patient called back and spoke with Joni Reining. We will call the patient back to reschedule the delivery upon resolution. We have not confirmed the new delivery date.        George Moore requested a refill of their Repatha via IVR. The [facility] has scheduled delivery per the patients request via Same Day Courier to be delivered to their prescription address on [place]- [place] pending.

## 2022-08-22 NOTE — Unmapped (Signed)
Veryl Speak 's Repatha shipment will be sent out  as a result of prior authorization now approved.     I have reached out to the patient  at [phone] and communicated the delivery change. We will reschedule the medication for the delivery date that the patient agreed upon.  We have confirmed the delivery date as 6/17, via same day courier.

## 2022-08-23 MED ORDER — LOSARTAN 100 MG-HYDROCHLOROTHIAZIDE 12.5 MG TABLET
ORAL_TABLET | Freq: Every day | ORAL | 0 refills | 90 days
Start: 2022-08-23 — End: 2023-08-23

## 2022-08-31 ENCOUNTER — Ambulatory Visit
Admit: 2022-08-31 | Discharge: 2022-09-01 | Payer: PRIVATE HEALTH INSURANCE | Attending: Adult Health | Primary: Adult Health

## 2022-08-31 MED ORDER — LOSARTAN 100 MG-HYDROCHLOROTHIAZIDE 12.5 MG TABLET
ORAL_TABLET | Freq: Every day | ORAL | 3 refills | 90 days | Status: CP
Start: 2022-08-31 — End: 2023-08-31

## 2022-08-31 MED ORDER — FELODIPINE ER 10 MG TABLET,EXTENDED RELEASE 24 HR
ORAL_TABLET | Freq: Every day | ORAL | 3 refills | 90 days | Status: CP
Start: 2022-08-31 — End: 2023-08-31

## 2022-08-31 MED ORDER — REPATHA SURECLICK 140 MG/ML SUBCUTANEOUS PEN INJECTOR
SUBCUTANEOUS | 11 refills | 28 days | Status: CP
Start: 2022-08-31 — End: ?
  Filled 2022-09-20: qty 2, 28d supply, fill #0

## 2022-08-31 MED ORDER — CARVEDILOL 25 MG TABLET
ORAL_TABLET | Freq: Two times a day (BID) | ORAL | 3 refills | 90 days | Status: CP
Start: 2022-08-31 — End: ?

## 2022-08-31 NOTE — Unmapped (Signed)
DIVISION OF CARDIOLOGY   [facility], [place]        Date of Service: 08/31/2022     Assessment/Plan   1. Essential hypertension  Patient with history of resistant HTN. OSA well treated, no significant EtOH. Renin, aldosterone unrevealing for secondary HTN w/u. Echo w/ mild LVH.   Now very well controlled on losartan 100 mg daily, HCTZ 12.5 mg daily, coreg 25 mg bid, felodipine 10 mg    2. DOE  Has been having exertional dyspnea and interscapular tightness with heavier exertion, relieved with rest. No acute EKG changes. Low risk nuclear stress test 08/2021.  Will get coronary CTA for further evaluation    3. HLD  4. At high risk for cardiovascular disease  Hx statin intolerance. Has T2DM  Now on Repatha w/ excellent lipid control.     The 10-year ASCVD risk score (Arnett DK, et al., 2019) is: 14.7%     Lab Results   Component Value Date    LDL 29 (L) 10/12/2021     5. OSA  On CPAP, compliant. Followed by [facility] sleep clinic.      Return to clinic:  Return in about 6 months (around 03/02/2023).    I personally spent 31 minutes face-to-face and non-face-to-face in the care of this patient, which includes all pre, intra, and post visit time on the date of service.     Subjective:   UJW:[id number], [name], MD  Chief complaint:  67 y.o. male with history of HTN, diabetes, HLD, severe OSA on CPAP, fatty liver disease who presents for follow up.      History of Present Illness:  Patient was previously seen for evaluation of severe HTN.  He reported difficulty in managing BP for several years and being on several BP meds with intolerance in the past.  Labs for secondary HTN work-up were unrevealing.  Echo showed mild LVH/grade 1 diastolic dysfunction, otherwise normal. Last seen 04/05/21. Since then, coreg was gradually uptitrated to 25 mg bid.     Last seen 08/2021 and was doing well.   Has hx of statin intolerance. Started on PCSK9i therapy last visit. Reduced hydrochlorothiazide due to lightheadedness. Here today for routine f/up - doing well. BP well controlled    After a bad night sleep, will notice some tightness between shoulder blades and SOB after he gets up and does heavier exertion. Improved with rest.   Went to chase some rabbits recently and had to stop because of pain between shoulder blades and SOB. He states he previously was having these symptoms when his BP was uncontrolled. Had low risk nuclear stress test 08/2021.   No exercise but very active with his job.   Using cpap nightly but having occasional issues w/ machine where it cuts off in the night.     He is former [employer]	[place], originally from [place]. He works on [employer] currently, mostly Corporate treasurer. He has 6 cats  Father w/ mild stroke, otherwise no CVD history.     Past Medical History  Patient Active Problem List   Diagnosis    Calculus of kidney    Enlarged prostate with (LUTS)    Encounter for long-term (current) use of other medications    Testicular hypofunction    Renal colic    Essential hypertension    Hyperlipidemia    Type II or unspecified type diabetes mellitus without mention of complication, uncontrolled    OSA on CPAP    Hematochezia  Chronic idiopathic constipation       Medications:  Current Outpatient Medications   Medication Sig Dispense Refill    CONTOUR NEXT TEST STRIPS Strp       empty container (SHARPS-A-GATOR DISPOSAL SYSTEM) Misc Use as directed for sharps disposal 1 each 2    JARDIANCE 10 mg tablet Take 1 tablet (10 mg total) by mouth daily.      carvedilol (COREG) 25 MG tablet Take 1 tablet (25 mg total) by mouth in the morning and 1 tablet (25 mg total) in the evening. Take with meals. 180 tablet 3    evolocumab (REPATHA SURECLICK) 140 mg/mL PnIj Inject the contents of one pen (140 mg) under the skin every fourteen (14) days. 2 mL 11    felodipine (PLENDIL) 10 MG 24 hr tablet Take 1 tablet (10 mg total) by mouth daily. 90 tablet 3    losartan-hydroCHLOROthiazide (HYZAAR) 100-12.5 mg per tablet Take 1 tablet by mouth daily. 90 tablet 3     No current facility-administered medications for this visit.       Allergies  Allergies   Allergen Reactions    Amoxicillin Rash    Penicillin Rash       Social History:   Social History     Tobacco Use    Smoking status: Never    Smokeless tobacco: Former   Substance Use Topics    Alcohol use: Yes     Comment: drinks once a blue moon    Drug use: No       Family History:  Family History   Problem Relation Age of Onset    Nephrolithiasis Father     Stroke Father     Nephrolithiasis Sister     Stroke Mother     Hypertension Brother     Prostate cancer Neg Hx     Kidney disease Neg Hx     GU problems Neg Hx        ROS- 12 system review is negative other than what is specified in the History of Present Illness.      Objective:   Physical Exam  Vitals:    08/31/22 1245 08/31/22 1304   BP: 136/64 126/68   BP Site:  L Arm   BP Position: Sitting Sitting   BP Cuff Size:  Large   Pulse: 54    Temp: 36.7 ??C (98.1 ??F)    TempSrc: Temporal    SpO2: 97%    Weight: 100.7 kg (222 lb)      Wt Readings from Last 3 Encounters:   08/31/22 100.7 kg (222 lb)   07/28/22 97.1 kg (214 lb)   06/14/22 97.1 kg (214 lb)     General-  Normal appearing male in no apparent distress.  Neurologic- Alert and oriented X3.  Cranial nerve II-XII grossly intact.  HEENT-  Normocephalic atraumatic head.  No scleral icterus.  MMM  Neck- Supple, no carotid bruis, no JVD.  Lungs- Clear to auscultation, no wheezes, rhonchi, or rhales.  Heart-  RRR, no MRG.   Extremities-  No clubbing or cyanosis.  No lower extremity edema bilaterally.    Pulses- +2 pulses in radial and dorsalis pedis bilaterally.  Psych- Normal mood, appropriate.     Laboratory data:  No results found for: PROBNP    Lab Results   Component Value Date    WBC 8.2 06/10/2022    HGB 13.6 06/10/2022    HCT 39.4 06/10/2022    PLT 136 (L) 06/10/2022  Lab Results   Component Value Date    NA 141 06/10/2022    K 3.7 06/10/2022    CL 106 06/10/2022 CO2 25.9 06/10/2022    BUN 17 06/10/2022    CREATININE 0.92 06/10/2022    GLU 207 (H) 06/10/2022    CALCIUM 9.4 06/10/2022       Lab Results   Component Value Date    ALBUMIN 3.7 06/10/2022    ALT 34 06/10/2022    AST 14 06/10/2022    INR 1.01 06/09/2022       Lab Results   Component Value Date    LDL 29 (L) 10/12/2021    HDL 30 (L) 10/12/2021       Electrocardiogram:  From 04/10/19 showed NSR with inferior T wave inversions. No prior EKGs for comparison.     From 04/05/21 showed NSR, inferior T wave abnormality, largely unchanged.     08/31/22 - showed NSR, inferior T wave abnormality, unchanged.     Nuclear stress test:  From 12/2004 at [facility] showed EF 70% without any stress-induced defects.     Echocardiogram:  From 04/22/19 showed mild LVH, LVEF > 55%. There is grade I diastolic dysfunction (impaired relaxation). The left atrium is mildly dilated in size. The right ventricle is normal in size, with normal systolic function. The aorta at the ascending aorta is mildly dilated.    Lipid panel:  Component      Latest Ref Rng 04/10/2019   Triglycerides      1 - 149 mg/dL 130    Cholesterol      100 - 199 mg/dL 865    HDL      40 - 59 mg/dL 35 (L)    LDL calculated      mg/dL 784    VLDL Cholesterol Cal      mg/dL 24    Chol/HDL Ratio 5.0    Non-HDL Cholesterol      mg/dL 696    FASTING Unknown        Glade Stanford, AGNP-C  Cardiology Nurse Practitioner  [facility]

## 2022-08-31 NOTE — Unmapped (Signed)
Coronary CT scan for further evaluation

## 2022-08-31 NOTE — Unmapped (Signed)
Pt in for yearly check up. Pt reports has had some difficulty sleeping, tries to  wear CPAP, but has some difficulty with  the supplies/supplier  Needs refills on hyzaar.

## 2022-09-12 NOTE — Unmapped (Signed)
Returning call from pt.  Pt had an episode at work where everything was going black.  He did not lose consciousness.  He sat down and drank water, EMS was called, BP stable, no change on Ekg rhythm.  EMS thought it may be anxiety because once he calmed down, he was fine.  He rested and drove himself home and now feels fine.

## 2022-09-14 NOTE — Unmapped (Signed)
[facility] Refill Coordination Note    George Moore, DOB: 03/17/1955  Phone: [phone] (home)       All above HIPAA information was verified with patient.         09/13/2022     6:23 PM   Specialty Rx Medication Refill Questionnaire   Which Medications would you like refilled and shipped? Repatha   Please list all current allergies: Emoxicilln   Have you missed any doses in the last 30 days? No   Have you had any changes to your medication(s) since your last refill? No   How many days remaining of each medication do you have at home? None   If receiving an injectable medication, next injection date is 09/21/2022   Have you experienced any side effects in the last 30 days? No   Please enter the full address (street address, city, state, zip code) where you would like your medication(s) to be delivered to. [address] [place]  [address]   Please specify on which day you would like your medication(s) to arrive. Note: if you need your medication(s) within 3 days, please call the pharmacy to schedule your order at [phone]  09/20/2022   Has your insurance changed since your last refill? No   Would you like a pharmacist to call you to discuss your medication(s)? No   Do you require a signature for your package? (Note: if we are billing Medicare Part B or your order contains a controlled substance, we will require a signature) No         Completed refill call assessment today to schedule patient's medication shipment from the [facility] [phone]).  All relevant notes have been reviewed.       Confirmed patient received a Conservation officer, historic buildings and a Surveyor, mining with first shipment. The patient will receive a drug information handout for each medication shipped and additional FDA Medication Guides as required.         REFERRAL TO PHARMACIST     Referral to the pharmacist: Not needed      [facility]     Shipping address confirmed in Epic.     Delivery Scheduled: Yes, Expected medication delivery date: 09/20/22.     Medication will be delivered via Same Day Courier to the prescription address in Epic WAM.    Tobi Bastos, PharmD   [facility] Specialty Pharmacist

## 2022-09-15 ENCOUNTER — Institutional Professional Consult (permissible substitution): Admit: 2022-09-15 | Discharge: 2022-09-16 | Payer: PRIVATE HEALTH INSURANCE

## 2022-09-15 ENCOUNTER — Ambulatory Visit: Admit: 2022-09-15 | Discharge: 2022-09-16 | Payer: PRIVATE HEALTH INSURANCE

## 2022-09-15 NOTE — Unmapped (Addendum)
Live Zio patch applied after instructions reviewed with patient,  Registered in [place].  [id number].

## 2022-09-15 NOTE — Unmapped (Signed)
Addended by: Thurnell Lose B on: 09/15/2022 04:01 PM     Modules accepted: Orders

## 2022-09-15 NOTE — Unmapped (Signed)
Attempted to call pt to ask if he would wear the Zio patch because he has had presyncope again.  LM for pt to return my call to make a NV appt to apply device.

## 2022-09-15 NOTE — Unmapped (Signed)
Pt returned my call and has scheduled a NV today for Zio placement.  To wear for 12 days, removing the patch prior to his CTA on 07/23.

## 2022-09-27 ENCOUNTER — Ambulatory Visit: Admit: 2022-09-27 | Discharge: 2022-09-28 | Payer: PRIVATE HEALTH INSURANCE

## 2022-09-29 NOTE — Unmapped (Signed)
Spoke to patient, he reports no recurrent presyncopal episodes.  He thinks that it was due to an anxiety attack.  His blood pressure was normal at that time.  He wore a life heart monitor and mailed it back earlier this week.  There have been no alerts.    CCTA was canceled this week as his BP was soft.  He states that he took his carvedilol much earlier than which he thinks is the cause.  Number provided for him to call and reschedule.    In regards to driving restriction, I would like to review the monitor results first but it is reassuring that he has had no further episodes.    I will be in contact with him once results received.  And he will reschedule the CCTA.    Glade Stanford, AGNP-C  Cardiology Nurse Practitioner  [facility]

## 2022-10-04 NOTE — Unmapped (Signed)
Patient called in for heart monitor results. Anxious to get results as he is currently restricted from driving. I made him aware that results are incomplete at this time. He is aware to call on Thursday to see if results are available at that time.

## 2022-10-10 NOTE — Unmapped (Signed)
[facility] Refill Coordination Note    George Moore, DOB: 01-19-1956  Phone: [phone] (home)       All above HIPAA information was verified with patient.         10/07/2022     8:13 PM   Specialty Rx Medication Refill Questionnaire   Which Medications would you like refilled and shipped? Repatha   Please list all current allergies: Emoxicilln   Have you missed any doses in the last 30 days? No   Have you had any changes to your medication(s) since your last refill? No   How many days remaining of each medication do you have at home? None   If receiving an injectable medication, next injection date is 10/19/2022   Have you experienced any side effects in the last 30 days? No   Please enter the full address (street address, city, state, zip code) where you would like your medication(s) to be delivered to. [address] [place] [address]   Please specify on which day you would like your medication(s) to arrive. Note: if you need your medication(s) within 3 days, please call the pharmacy to schedule your order at [phone]  10/18/2022   Has your insurance changed since your last refill? No   Would you like a pharmacist to call you to discuss your medication(s)? No   Do you require a signature for your package? (Note: if we are billing Medicare Part B or your order contains a controlled substance, we will require a signature) No         Completed refill call assessment today to schedule patient's medication shipment from the [facility] [phone]).  All relevant notes have been reviewed.       Confirmed patient received a Conservation officer, historic buildings and a Surveyor, mining with first shipment. The patient will receive a drug information handout for each medication shipped and additional FDA Medication Guides as required.         REFERRAL TO PHARMACIST     Referral to the pharmacist: Not needed      [facility]     Shipping address confirmed in Epic.     Delivery Scheduled: Yes, Expected medication delivery date: 10/18/22.     Medication will be delivered via Same Day Courier to the prescription address in Epic WAM.    George Moore   [facility] Specialty Technician

## 2022-10-14 ENCOUNTER — Ambulatory Visit: Admit: 2022-10-14 | Discharge: 2022-10-15 | Payer: PRIVATE HEALTH INSURANCE

## 2022-10-14 MED ADMIN — nitroglycerin (NITROSTAT) 0.4 MG SL tablet: SUBLINGUAL | @ 16:00:00 | Stop: 2022-10-14

## 2022-10-14 MED ADMIN — iohexol (OMNIPAQUE) 350 mg iodine/mL solution 100 mL: 100 mL | INTRAVENOUS | @ 17:00:00 | Stop: 2022-10-14

## 2022-10-17 ENCOUNTER — Ambulatory Visit
Admit: 2022-10-17 | Discharge: 2022-10-18 | Payer: PRIVATE HEALTH INSURANCE | Attending: Adult Health | Primary: Adult Health

## 2022-10-17 DIAGNOSIS — G4733 Obstructive sleep apnea (adult) (pediatric): Principal | ICD-10-CM

## 2022-10-17 DIAGNOSIS — I10 Essential (primary) hypertension: Principal | ICD-10-CM

## 2022-10-17 DIAGNOSIS — E785 Hyperlipidemia, unspecified: Principal | ICD-10-CM

## 2022-10-17 DIAGNOSIS — Z0000 Encounter for general adult medical examination without abnormal findings: Principal | ICD-10-CM

## 2022-10-17 DIAGNOSIS — I25118 Atherosclerotic heart disease of native coronary artery with other forms of angina pectoris: Principal | ICD-10-CM

## 2022-10-17 MED ORDER — NITROGLYCERIN 0.4 MG SUBLINGUAL TABLET
ORAL_TABLET | SUBLINGUAL | 0 refills | 1 days | Status: CP | PRN
Start: 2022-10-17 — End: 2023-10-17

## 2022-10-17 NOTE — Unmapped (Addendum)
Will arrange for cardiac cath    Referral to [facility]    Start baby aspirin

## 2022-10-17 NOTE — Unmapped (Signed)
Pt is here for CTA results, he has been having pain between his shoulder blads, sob and profound tiredness.  He would have to stop and rest between setting up the beef at the [facility] Saturday.  He had to take frequent rest periods. Omron BP machine for accuracy.

## 2022-10-17 NOTE — Unmapped (Signed)
DIVISION OF CARDIOLOGY   [employer], [place]        Date of Service: 10/17/2022     Assessment/Plan   1. Essential hypertension  Patient with history of resistant HTN. Now well controlled - has had some lower readings recently but asx. He is getting new home BP machine and will notify me of readings.   Continue losartan 100 mg daily, HCTZ 12.5 mg daily, coreg 25 mg bid, felodipine 10 mg    2. CAD w/ stable angina  Has been having increasing exertional dyspnea and interscapular tightness with exertion, relieved with rest.   Recent coronary CTA showing significant obstructive CAD, notably 90% ostial & mid- LAD and diffuse RCA disease with heavy calcification.  Shared decision made to proceed with cardiac cath w/ possible intervention.   Start ASA 81 mg  Rx for SL NTG provided  - Cath/Vascular Case Request: Left Heart Catheterization With Intervention     3. HLD  Hx statin intolerance.   Now on Repatha w/ excellent lipid control.     The ASCVD Risk score (Arnett DK, et al., 2019) failed to calculate.     Lab Results   Component Value Date    LDL 29 (L) 10/12/2021     5. OSA  On CPAP, compliant. Followed by [facility] sleep clinic.      6. Routine adult health maintenance  He is requesting new PCP at [facility]  - Ambulatory referral to [facility]; Future    Return to clinic:  Return in about 6 weeks (around 11/28/2022).    I personally spent 45 minutes face-to-face and non-face-to-face in the care of this patient, which includes all pre, intra, and post visit time on the date of service.     Subjective:   GNF:AOZHY, [name], MD  Chief complaint:  67 y.o. male with history of HTN, diabetes, HLD, severe OSA on CPAP, fatty liver disease who presents for follow up of CCTA results.      History of Present Illness:  Patient was previously seen for evaluation of severe HTN.  He reported difficulty in managing BP for several years and being on several BP meds with intolerance in the past.  Labs for secondary HTN work-up were unrevealing.  Echo showed mild LVH/grade 1 diastolic dysfunction, otherwise normal. We have worked to optimize BP meds with good result.   Has hx of statin intolerance. Started on PCSK9i therapy.     Last seen 08/31/22 - having exertional dyspnea and interscapular tightness with heavier exertion, relieved with rest. No acute EKG changes. Low risk nuclear stress test 08/2021.  He underwent coronary CTA last week showing significant obstructive CAD, notably 90% ostial & mid- LAD and diffuse RCA disease.   He had a presyncopal episode at work in July - EMS assessed and no significant findings. Zio AT monitor showed no arrhythmias.      Today, he reports cont'd episodes of angina. On Saturday, he got up early to go to farmers market - loaded up truck and set up and started feeling poorly - profound fatigue, pain between shoulder blades, neck and SOB. Improved after several hrs of rest.   No recurrent presyncope.     Home BP machine is running higher than our machine. Planning to get new one. Had low BP readings when he came for testing.     He is former [employer]	[place], originally from [place]. He works on [employer] currently, mostly Corporate treasurer. He has 6 cats,  lives with his wife.   Father w/ mild stroke, mother w/ MI      Past Medical History  Patient Active Problem List   Diagnosis    Calculus of kidney    Enlarged prostate with (LUTS)    Encounter for long-term (current) use of other medications    Testicular hypofunction    Renal colic    Essential hypertension    Hyperlipidemia    Type II or unspecified type diabetes mellitus without mention of complication, uncontrolled    OSA on CPAP    Hematochezia    Chronic idiopathic constipation    Coronary artery disease of native artery of native heart with stable angina pectoris (CMS-HCC)       Medications:  Current Outpatient Medications   Medication Sig Dispense Refill    carvedilol (COREG) 25 MG tablet Take 1 tablet (25 mg total) by mouth in the morning and 1 tablet (25 mg total) in the evening. Take with meals. 180 tablet 3    evolocumab (REPATHA SURECLICK) 140 mg/mL PnIj Inject the contents of one pen (140 mg) under the skin every fourteen (14) days. 2 mL 11    felodipine (PLENDIL) 10 MG 24 hr tablet Take 1 tablet (10 mg total) by mouth daily. 90 tablet 3    losartan-hydroCHLOROthiazide (HYZAAR) 100-12.5 mg per tablet Take 1 tablet by mouth daily. 90 tablet 3    aspirin (ECOTRIN) 81 MG tablet Take 1 tablet (81 mg total) by mouth daily.      CONTOUR NEXT TEST STRIPS Strp       empty container (SHARPS-A-GATOR DISPOSAL SYSTEM) Misc Use as directed for sharps disposal 1 each 2    JARDIANCE 10 mg tablet Take 1 tablet (10 mg total) by mouth daily.      nitroglycerin (NITROSTAT) 0.4 MG SL tablet Place 1 tablet (0.4 mg total) under the tongue every five (5) minutes as needed for chest pain. Maximum of 3 doses in 15 minutes. 25 tablet 0     No current facility-administered medications for this visit.       Allergies  Allergies   Allergen Reactions    Amoxicillin Rash    Penicillin Rash       Social History:   Social History     Tobacco Use    Smoking status: Never    Smokeless tobacco: Former   Substance Use Topics    Alcohol use: Yes     Comment: drinks once a blue moon    Drug use: No       Family History:  Family History   Problem Relation Age of Onset    Nephrolithiasis Father     Stroke Father     Nephrolithiasis Sister     Stroke Mother     Hypertension Brother     Prostate cancer Neg Hx     Kidney disease Neg Hx     GU problems Neg Hx        ROS- 12 system review is negative other than what is specified in the History of Present Illness.      Objective:   Physical Exam  Vitals:    10/17/22 1358   BP: 121/58   Pulse: 61   Resp: 20   Temp: 36.7 ??C (98.1 ??F)   SpO2: 97%   Weight: 99.4 kg (219 lb 1.6 oz)   Height: 177.8 cm (5' 10)       Wt Readings from Last 3 Encounters:   10/17/22 99.4 kg (  219 lb 1.6 oz)   08/31/22 100.7 kg (222 lb)   07/28/22 97.1 kg (214 lb)     General-  Normal appearing male in no apparent distress.  Neurologic- Alert and oriented X3.  Cranial nerve II-XII grossly intact.  HEENT-  Normocephalic atraumatic head.  No scleral icterus.  MMM  Neck- Supple, no carotid bruis, no JVD.  Lungs- Clear to auscultation, no wheezes, rhonchi, or rhales.  Heart-  RRR, no MRG.   Extremities-  No clubbing or cyanosis.  No lower extremity edema bilaterally.    Pulses- +2 pulses in radial and dorsalis pedis bilaterally.  Psych- Normal mood, appropriate.     Laboratory data:  No results found for: PROBNP    Lab Results   Component Value Date    WBC 8.2 06/10/2022    HGB 13.6 06/10/2022    HCT 39.4 06/10/2022    PLT 136 (L) 06/10/2022       Lab Results   Component Value Date    NA 141 06/10/2022    K 3.7 06/10/2022    CL 106 06/10/2022    CO2 25.9 06/10/2022    BUN 17 06/10/2022    CREATININE 1.1 10/14/2022    GLU 207 (H) 06/10/2022    CALCIUM 9.4 06/10/2022       Lab Results   Component Value Date    ALBUMIN 3.7 06/10/2022    ALT 34 06/10/2022    AST 14 06/10/2022    INR 1.01 06/09/2022       Lab Results   Component Value Date    LDL 29 (L) 10/12/2021    HDL 30 (L) 10/12/2021       Electrocardiogram:  From 04/10/19 showed NSR with inferior T wave inversions. No prior EKGs for comparison.     From 04/05/21 showed NSR, inferior T wave abnormality, largely unchanged.     08/31/22 - showed NSR, inferior T wave abnormality, unchanged.     Zio AT 09/15/22  Patient had a min HR of 48 bpm, max HR of 133 bpm, and avg HR of 62   bpm. Predominant underlying rhythm was Sinus Rhythm. 1 run of   Supraventricular Tachycardia occurred lasting 4 beats with a max rate of   103 bpm (avg 98 bpm). Isolated SVEs were rare (<1.0%), SVE Couplets   were rare (<1.0%), and no SVE Triplets were present. Isolated VEs were   rare (<1.0%), VE Couplets were rare (<1.0%), and no VE Triplets were   present. Ventricular Bigeminy was present.     No patient triggered events or diary entries. Coronary CTA 10/14/22  - Study is limited due to artifact. There is diffuse coronary artery disease including probably obstructive disease in the proximal and mid LAD, as well as the mid RCA. Ostial/proximal RCA is not well visualized.     Nuclear stress test:  From 12/2004 at [facility] showed EF 70% without any stress-induced defects.     Nuclear stress 09/01/21  - Low risk, probably normal myocardial perfusion study   - No evidence of any significant ischemia or scar   - There is a very small in size, mild fixed perfusion abnormality involving the apical cap segments. This is consistent with probable apical thinning, a normal variant, given normal wall motion.   - Left ventricular systolic function is normal. Post stress the ejection fraction is > 65%. The left ventricle is normal in size.   - Scattered coronary calcifications were noted on the attenuation CT   -  Incidental CT findings: Mediastinal nodes are somewhat more prominent than usual. There is a water density structure adjacent to the heart on the right (3:28), likely a cyst.       Echocardiogram:  From 04/22/19 showed mild LVH, LVEF > 55%. There is grade I diastolic dysfunction (impaired relaxation). The left atrium is mildly dilated in size. The right ventricle is normal in size, with normal systolic function. The aorta at the ascending aorta is mildly dilated.      Glade Stanford, AGNP-C  Cardiology Nurse Practitioner  [facility]

## 2022-10-18 MED FILL — REPATHA SURECLICK 140 MG/ML SUBCUTANEOUS PEN INJECTOR: SUBCUTANEOUS | 28 days supply | Qty: 2 | Fill #1

## 2022-10-18 NOTE — Unmapped (Signed)
Unable to leave message as documented in last encounter.  VM full. My chart message sent

## 2022-10-26 ENCOUNTER — Ambulatory Visit: Admit: 2022-10-26 | Discharge: 2022-10-26 | Payer: PRIVATE HEALTH INSURANCE

## 2022-10-26 LAB — BASIC METABOLIC PANEL
ANION GAP: 7 mmol/L (ref 5–14)
BLOOD UREA NITROGEN: 23 mg/dL (ref 9–23)
BUN / CREAT RATIO: 22
CALCIUM: 9.6 mg/dL (ref 8.7–10.4)
CHLORIDE: 105 mmol/L (ref 98–107)
CO2: 28 mmol/L (ref 20.0–31.0)
CREATININE: 1.05 mg/dL
EGFR CKD-EPI (2021) MALE: 79 mL/min/{1.73_m2} (ref >=60)
GLUCOSE RANDOM: 256 mg/dL — ABNORMAL HIGH (ref 70–99)
POTASSIUM: 4 mmol/L (ref 3.4–4.8)
SODIUM: 140 mmol/L (ref 135–145)

## 2022-10-26 LAB — CBC
HEMATOCRIT: 42.8 % (ref 39.0–48.0)
HEMOGLOBIN: 14.7 g/dL (ref 12.9–16.5)
MEAN CORPUSCULAR HEMOGLOBIN CONC: 34.4 g/dL (ref 32.0–36.0)
MEAN CORPUSCULAR HEMOGLOBIN: 28.8 pg (ref 25.9–32.4)
MEAN CORPUSCULAR VOLUME: 83.5 fL (ref 77.6–95.7)
MEAN PLATELET VOLUME: 9.1 fL (ref 6.8–10.7)
PLATELET COUNT: 129 10*9/L — ABNORMAL LOW (ref 150–450)
RED BLOOD CELL COUNT: 5.13 10*12/L (ref 4.26–5.60)
RED CELL DISTRIBUTION WIDTH: 13.2 % (ref 12.2–15.2)
WBC ADJUSTED: 7.2 10*9/L (ref 3.6–11.2)

## 2022-10-26 MED ORDER — ASPIRIN 81 MG TABLET,DELAYED RELEASE
ORAL_TABLET | Freq: Every day | ORAL | 11 refills | 30 days | Status: CP
Start: 2022-10-26 — End: 2023-10-26

## 2022-10-26 MED ORDER — CLOPIDOGREL 75 MG TABLET
ORAL_TABLET | Freq: Every day | ORAL | 11 refills | 30 days | Status: CP
Start: 2022-10-26 — End: 2023-10-26

## 2022-10-26 MED ADMIN — verapamil (ISOPTIN) injection: INTRA_ARTERIAL | @ 12:00:00 | Stop: 2022-10-26

## 2022-10-26 MED ADMIN — sodium chloride (NS) 0.9 % infusion: 125 mL/h | INTRAVENOUS | @ 14:00:00 | Stop: 2022-10-26

## 2022-10-26 MED ADMIN — iohexol (OMNIPAQUE) 300 mg iodine/mL solution: INTRAVENOUS | @ 14:00:00 | Stop: 2022-10-26

## 2022-10-26 MED ADMIN — aspirin chewable tablet 324 mg: 324 mg | ORAL | @ 11:00:00 | Stop: 2022-10-26

## 2022-10-26 MED ADMIN — lidocaine (PF) (XYLOCAINE-MPF) 20 mg/mL (2 %) injection: @ 12:00:00 | Stop: 2022-10-26

## 2022-10-26 MED ADMIN — verapamil (ISOPTIN) injection: INTRA_ARTERIAL | @ 14:00:00 | Stop: 2022-10-26

## 2022-10-26 MED ADMIN — midazolam (VERSED) injection: INTRAVENOUS | @ 12:00:00 | Stop: 2022-10-26

## 2022-10-26 MED ADMIN — fentaNYL (PF) (SUBLIMAZE) injection: INTRAVENOUS | @ 12:00:00 | Stop: 2022-10-26

## 2022-10-26 MED ADMIN — heparin (porcine) 1000 unit/mL injection: INTRAVENOUS | @ 13:00:00 | Stop: 2022-10-26

## 2022-10-26 MED ADMIN — heparin (porcine) 1000 unit/mL injection: INTRAVENOUS | @ 12:00:00 | Stop: 2022-10-26

## 2022-10-26 MED ADMIN — midazolam (VERSED) injection: INTRAVENOUS | @ 13:00:00 | Stop: 2022-10-26

## 2022-10-26 MED ADMIN — fentaNYL (PF) (SUBLIMAZE) injection: INTRAVENOUS | @ 13:00:00 | Stop: 2022-10-26

## 2022-10-26 MED ADMIN — heparin (porcine) in NS Manifold Flush: @ 12:00:00 | Stop: 2022-10-26

## 2022-10-26 MED ADMIN — clopidogrel (PLAVIX) tablet: ORAL | @ 13:00:00 | Stop: 2022-10-26

## 2022-10-26 NOTE — Unmapped (Signed)
CARDIOLOGY PROCEDURE PROGRESS NOTE:   Admit Date: October 26, 2022         Attending: Autumn Messing, MD    Primary Service: Cards Procedures (MDS)    Admitting Diagnosis:  Coronary artery disease of native artery of native heart with stable angina pectoris (CMS-HCC) [I25.118]    Patient seen and evaluated post procedure.  I agree with H&P as recorded.    Right  radial artery puncture site with dressing clean, dry and intact. No bleeding or hematoma noted at puncture site. TR band in place.    Procedure:  10/26/2022 Coronary angiogram with percutaneous coronary intervention (PCI). See cath report for details.         ASSESSMENT AND PLAN      ASSESSMENT AND PLAN:    George Moore is a 67 y.o.-year-old male with CAD who is admitted s/p coronary angiogram and percutaneous coronary intervention (PCI).     1. CAD s/p CATH/PCI.   * Monitor on telemetry.  * Vital signs per protocol.   * Monitor radial arterial access site(s) for bleeding or hematoma.  * Start clopidogrel 75mg  daily and ASA 81mg  daily.  * Continue  Repatha due to statin intolerance .   * Up ad lib after bedrest complete.  * Check 12 lead EKG in post procedure  * Cardiac Rehab referral placed in EPIC     2. HTN   * Continue home medications.       DISPO:  * MDS  * Anticipate discharge after labs/EKG reviewed and patient hemodynamically stable post-procedure.      FEN:    * IVF: NS  * Monitor/replete electrolytes prn    DVT/GI Prophylaxis:  * PPI ppx: GI proph not indicated as patient to be discharged <24 hours post-procedure.   * DVT ppx: anticoagulation as above and encourage ambulation after bedrest complete.        BEST PRACTICE:  BB: yes  Statin: no due to intolerance  ASA: yes  Antiplatelet agent: yes     Cardiac Rehab Referral: YES  Smoking Cessation Counseling: YES  Patient was counseled on smoking cessation and/or continued abstinence from tobacco as a strategy to reduce risk of cardiovascular disease and events. Patient participated in the counseling and verbalized understanding of the serious risks of using nicotine products.       Lab Results   Component Value Date    WBC 7.2 10/26/2022    HGB 14.7 10/26/2022    HCT 42.8 10/26/2022    PLT 129 (L) 10/26/2022       Lab Results   Component Value Date    NA 140 10/26/2022    K 4.0 10/26/2022    CL 105 10/26/2022    CO2 28.0 10/26/2022    BUN 23 10/26/2022    CREATININE 1.05 10/26/2022    GLU 256 (H) 10/26/2022    CALCIUM 9.6 10/26/2022       Lab Results   Component Value Date    INR 1.01 06/09/2022    APTT 27.1 06/09/2022       Lab Results   Component Value Date    LDL 29 (L) 10/12/2021       Lab Results   Component Value Date    A1C 6.6 07/23/2021     Karna Christmas, AGACNP-BC

## 2022-10-26 NOTE — Unmapped (Signed)
Received patient from cath lab, report from [facility]. Pt is A+Ox4, vss. Rt radial cath site with TR band covering, instilled with 10ml of air. Positive radial pulse and pleth, and no bleeding or hematoma noted.

## 2022-10-26 NOTE — Unmapped (Signed)
Cardiac Catheterization Laboratory  [place], [place]  Tel: [phone]     Fax: [phone]    HISTORY & PHYSICAL ASSESSMENT    PCP:  Lynford Citizen, MD  Phone: [phone]  Fax: [phone]    Referring physicians: Oneita Hurt, None Per Patient  [address]  [place],  [place] [address]     Primary cardiologist: Phineas Inches    Procedures to be performed:   Coronary angiography  Left heart catheterization  Possible percutaneous coronary intervention    Indication: CAD with stable angina    Consent: I hereby certify that the nature, purpose, benefits, usual and most frequent risks of, and alternatives to, the operation or procedure have been explained to the patient (or person authorized to sign for the patient) either by a physician or by the provider who is to perform the operation or procedure, that the patient has had an opportunity to ask questions, and that those questions have been answered. The patient or the patient's representative has been advised that selected tasks may be performed by assistants to the primary health care provider(s). I believe that the patient (or person authorized to sign for the patient) understands what has been explained, and has consented to the operation or procedure.  _____________________________________________________________________    HISTORY: This is a 67 y.o. male with PMH of HTN, CAD with stable angina, HLD, and OSA who presents today for LHC.    Last seen in clinic by Phineas Inches on 10/17/2022. At that time he had been having increasing exertional dyspnea and interscapular tightness with exertion that was relieved with rest. Recent coronary CTA with diffuse CAD including probably obstructive disease in the proximal and mid LAD (90% stenosis in the ostial LAD and mid LAD) as well as the mid RCA (diffuse RCA disease with heavy calcification). He has reported ongoing episodes of angina since June. Plan is for [facility] with coronary angiography today.     TTE 04/2019: 1. The left ventricle is normal in size with mildly increased wall  thickness.  2. The left ventricular systolic function is normal, LVEF is visually  estimated at > 55%.  3. There is grade I diastolic dysfunction (impaired relaxation).  4. The left atrium is mildly dilated in size.  5. The right ventricle is normal in size, with normal systolic function.  6. The aorta at the ascending aorta is mildly dilated.    Stress 09/01/2021: - Low risk, probably normal myocardial perfusion study  - No evidence of any significant ischemia or scar  - There is a very small in size, mild fixed perfusion abnormality involving the apical cap segments. This is consistent with probable apical thinning, a normal variant, given normal wall motion.  - Left ventricular systolic function is normal. Post stress the ejection fraction is > 65%. The left ventricle is normal in size.  - Scattered coronary calcifications were noted on the attenuation CT  - Incidental CT findings: Mediastinal nodes are somewhat more prominent than usual. There is a water density structure adjacent to the heart on the right (3:28), likely a cyst.    CTA Coronaries 10/14/2022: Study is limited due to artifact. There is diffuse coronary artery disease including probably obstructive disease in the proximal and mid LAD, as well as the mid RCA. Ostial/proximal RCA is not well visualized.     Last LHC: N/A    Brief labs:  Lab Results   Component Value Date    HGB 13.6 06/10/2022  HGB 16.0 08/01/2013    Platelet 136 (L) 06/10/2022    Creatinine Whole Blood, POC 1.1 10/14/2022    INR 1.01 06/09/2022       Risk factors for coronary artery disease include:  hypertension  hyperlipidemia  diabetes mellitus    Other history includes:  Previous smoker; smokeless tobacco use.   No known history of prior PCI.  No known history of prior MI.  No known history of prior CABG.  No known heart failure.  No cardiac arrest surrounding this admission.    Assessments:  ECG: pending  Stress Test: Stress Nuclear : with a positive result on 09/01/2021 ; low risk  No new antiarrhythmic therapy initiated prior to cath lab.  No cardiac CTA performed  No prior angio WITHOUT intervention.  An EF of >55% was obtained on 04/2019.   No Agatston coronary calcium score was assessed.  CSHA Clinical Frailty Scale: 3 - Managing Well  Chest Pain Assessment: typical angina   Cardiovascular Instability: none    Pre-procedure medications administered: Aspirin    Medications contraindicated: N/A     OBJECTIVE  BP 132/68  - Pulse 56  - Temp 36.6 ??C (97.8 ??F) (Oral)  - Resp 20  - Ht 172.7 cm (5' 8)  - SpO2 96%  - BMI 33.31 kg/m??     Gen: WDWN in NAD, answers questions appropriately  Eyes: Sclera anicteric, EOM grossly intact  HENT: atraumatic, MMM. OP w/o erythema or exudate   Heart: RRR, no murmurs  Lungs: CTAB, no crackles or wheezes, no increased WOB  Extremities: No Edema: pulses are +2 in BUE  Neuro: No gross focal deficits.  Psych: Alert, oriented, normal mood and affect.    Labs and imaging were reviewed.     The patient is a low  bleeding risk with an adjusted CathPCI bleeding Event Risk of 0.8% based on the [facility] predicted risk calculator which is defined as is an absolute drop in hemoglobin ? 4g/dL, a RBC transfusion and/or a procedural intervention/surgery to reverse/stop bleeding that occurs within 72 hours of the PCI procedure.     Strategies used to mitigate risk include: Give preference to radial approach, micropuncture for femoral access, and limit heparin administration as able.

## 2022-10-26 NOTE — Unmapped (Signed)
PRELIMINARY CARDIAC CATHETERIZATION REPORT  (Full report to follow within 72 hours)  ____________________________________________________________________________     Findings:  2-vessel coronary artery disease. Subtotal occlusion of the mid RCA and 90% stenosis of the mid LCx.  Initially attempted to address the mid RCA lesion; we were able to pass a wire through this lesion but could not deliver balloons or other equipment.  Successful PCI of the mid LCx with a 2.75 x 16 mm Synergy DES. Excellent angiographic result with TIMI 3 flow.    Recommendations:  Aggressive secondary prevention.  DAPT with ASA 81 mg daily and clopidogrel 75 mg daily for 6 months, followed by [facility] with clopidogrel 75 mg daily indefinitely.  CYP2C19 genotyping.*  Consider re-attempt at PCI of the RCA, likely with rotational atherectomy, if pt has persistent symptoms despite PCI of the LCx and OMT.  ____________________________________________________________________________                Date of cardiac procedure: 10/26/2022    Pre-op diagnosis: obstructive CAD on CCTA, stable angina  Post-op diagnosis: same     Performing Service:  Cardiology  Surgeons and Role:     * Autumn Messing, MD - Primary     * Aline August, Neena Rhymes, MD - Fellow - Diagnostic     * Radene Gunning, MD - Fellow - Interventional    Procedures performed:  Coronary angiography  Percutaneous coronary intervention    Arterial access site: right radial artery    PCI details:   #1 - RCA Ikari Right 1.0 guide catheter    Runthrough, Fielder XT-A coronary guidewires    2.0 x 12 mm Sapphire Pro balloon -- would not cross    1.5 x 12 mm Sapphire Pro balloon -- would not cross  #2 - LCx EBU 3.5 guide catheter    Runthrough coronary guidewire    2.0 x 12 mm Sapphire balloon    2.75 x 16 mm Synergy DES    2.75 x 12 mm Garey Trek Neo balloon  ____________________________________________________________________________                No specimens were taken.  Estimated blood loss: minimal.     I was present during the entire procedure.    Kathreen Devoid Verdis Frederickson, MD, [facility]  Assistant Professor of Medicine  Division of Cardiology  [facility]    10/26/2022 12:41 PM    * CYP2C19 genotyping was ordered to determine whether the patient could be safely and effectively treated with clopidogrel. If clopidogrel was not used initially, it is because the patient is at high risk for thrombotic events and, until the genotyping results are available, it is unknown whether the patient will metabolize clopidogrel to the active drug. Clinical studies have shown that patients who are not responders to clopidogrel (as identified by CYP2C19 genotyping) have a higher incidence of death, myocardial infarction and stroke when treated with clopidogrel (Cardiovascular Revascularization Medicine 41 (2022): 115-121). The CYP2C19 genotype results will be used to determine whether the patient can be safely and effectively treated with clopidogrel; this is clinically useful information as clopidogrel is associated with lower costs and less bleeding risk than alternative P2Y12 inhibitors.

## 2022-11-09 NOTE — Unmapped (Signed)
[facility] Refill Coordination Note    Specialty Lite Medication(s) to be Shipped:   Repatha    Other medication(s) to be shipped: No additional medications requested for fill at this time     George Moore, DOB: Aug 16, 1955  Phone: [phone] (home)       All above HIPAA information was verified with patient.     Was a Nurse, learning disability used for this call? No    Changes to medications: George Moore reports no changes at this time.  Changes to insurance: No      REFERRAL TO PHARMACIST     Referral to the pharmacist: Not needed      [facility]     Shipping address confirmed in Epic.     Delivery Scheduled: Yes, Expected medication delivery date: 11/15/2022.     Medication will be delivered via Same Day Courier to the prescription address in Epic WAM.    Kerby Less   [facility] Specialty Technician

## 2022-11-15 MED FILL — REPATHA SURECLICK 140 MG/ML SUBCUTANEOUS PEN INJECTOR: SUBCUTANEOUS | 28 days supply | Qty: 2 | Fill #2

## 2022-12-05 ENCOUNTER — Ambulatory Visit
Admit: 2022-12-05 | Discharge: 2022-12-06 | Payer: PRIVATE HEALTH INSURANCE | Attending: Adult Health | Primary: Adult Health

## 2022-12-05 MED ORDER — NITROGLYCERIN 0.4 MG SUBLINGUAL TABLET
ORAL_TABLET | SUBLINGUAL | 6 refills | 1 days | Status: CP | PRN
Start: 2022-12-05 — End: 2023-12-05

## 2022-12-05 MED ORDER — PRASUGREL 10 MG TABLET
ORAL_TABLET | Freq: Every day | ORAL | 11 refills | 30 days | Status: CP
Start: 2022-12-05 — End: 2023-12-05

## 2022-12-05 NOTE — Unmapped (Signed)
Patient was notified of operational disruptions. Patient opted to: {Blank:[id number]::schedule their refill with understanding of potential delay until 10/1 or later.,transfer to another pharmacy. This was facilitated by pharmacy staff     [facility] Refill Coordination Note    TIMOTY BOURKE, DOB: January 08, 1956  Phone: [phone] (home)       All above HIPAA information was verified with patient.         12/04/2022    11:16 AM   Specialty Rx Medication Refill Questionnaire   Which Medications would you like refilled and shipped? Repatha   Please list all current allergies: Emoxicilln   Have you missed any doses in the last 30 days? No   Have you had any changes to your medication(s) since your last refill? No   How many days remaining of each medication do you have at home? 0   If receiving an injectable medication, next injection date is 12/14/2022   Have you experienced any side effects in the last 30 days? No   Please enter the full address (street address, city, state, zip code) where you would like your medication(s) to be delivered to. [address]  [place],  [place] [address]   Please specify on which day you would like your medication(s) to arrive. Note: if you need your medication(s) within 3 days, please call the pharmacy to schedule your order at [phone]  12/13/2022   Has your insurance changed since your last refill? No   Would you like a pharmacist to call you to discuss your medication(s)? No   Do you require a signature for your package? (Note: if we are billing Medicare Part B or your order contains a controlled substance, we will require a signature) No   Additional Comments: Thanks         Completed refill call assessment today to schedule patient's medication shipment from the [facility] [phone]).  All relevant notes have been reviewed.       Confirmed patient received a Conservation officer, historic buildings and a Surveyor, mining with first shipment. The patient will receive a drug information handout for each medication shipped and additional FDA Medication Guides as required.         REFERRAL TO PHARMACIST     Referral to the pharmacist: Not needed      [facility]     Shipping address confirmed in Epic.     Delivery Scheduled: Yes, Expected medication delivery date: 12/13/22.     Medication will be delivered via Same Day Courier to the prescription address in Epic WAM.    [name]   [facility] Specialty Technician

## 2022-12-05 NOTE — Unmapped (Addendum)
Stop plavix. Start prasugrel ~24 hrs from last dose of plavix.     Will followup on cardiac rehab

## 2022-12-05 NOTE — Unmapped (Signed)
Pt says his left side has been numb since discharged fro, the hospital.  His pain between his shoulder blades (angina for him) he had a few times but higher on his neck.  None since.  Over  all his recovery has been slow.  Has not heard from Cardiac Rehab.    Cardiac Rehab was ordered by NP, I sent new order and will call them today.  No sure if he is taking Jardiance 10 mg or 25 mg.

## 2022-12-05 NOTE — Unmapped (Signed)
DIVISION OF CARDIOLOGY   [facility] [place]        Date of Service: 12/05/2022     Assessment/Plan   1. Essential hypertension  Patient with history of resistant HTN. Now well controlled.   Continue losartan 100 mg daily, HCTZ 12.5 mg daily, coreg 25 mg bid, felodipine 10 mg    2. CAD w/ stable angina  Had increasing exertional dyspnea and interscapular tightness with exertion, relieved with rest.   He had abnormal coronary CTA and underwent PCI to 99% mid LCX on 10/26/22. 100% subtotal RCA lesion was unable to be crossed.   Overall doing well with minimal angina.   - Start cardiac rehab [facility])  - intermediate plavix metabolizer>> switch plavix to prasugrel. No need for loading dose as >30 days out  - continue ASA, coreg, repatha    3. HLD  Hx statin intolerance.   Now on Repatha w/ excellent lipid control.     Lab Results   Component Value Date    LDL 29 (L) 10/12/2021     4. T2DM  Establishing with new PCP  On Jardiance  Recommend considering GLP1 therapy    Lab Results   Component Value Date    A1C 6.6 07/23/2021     5. OSA  On CPAP, compliant. Followed by [facility] sleep clinic.        Return to clinic:  Return in about 3 months (around 03/06/2023).    I personally spent 31 minutes face-to-face and non-face-to-face in the care of this patient, which includes all pre, intra, and post visit time on the date of service.     Subjective:   ZOX:WRUEA, [name], MD  Chief complaint:  67 y.o. male with history of HTN, diabetes, HLD, severe OSA on CPAP, fatty liver disease, CAD s/p PCI who presents for f/up after recent PCI     History of Present Illness:  Patient was previously seen for evaluation of severe HTN.  He reported difficulty in managing BP for several years and being on several BP meds with intolerance in the past.  Labs for secondary HTN work-up were unrevealing.  Echo showed mild LVH/grade 1 diastolic dysfunction, otherwise normal. We have worked to optimize BP meds with good result. Has hx of statin intolerance. Started on PCSK9i therapy.     This past summer, was having exertional dyspnea and interscapular tightness with heavier exertion, relieved with rest. He had abnormal coronary CTA and underwent PCI to 99% mid LCX 10/26/22. 100% subtotal RCA lesion was unable to be crossed.    He had a presyncopal episode at work in July - EMS assessed and no significant findings. Zio AT monitor showed no arrhythmias.      Here today for f/up after PCI. Overall feeling good. Has only had 2 episodes of angina/back pain initially after stent. None recent. He is back to work, doing normal activities.   Hasn't heard from cardiac rehab.   No recurrent presyncope/syncope. Denies palpitations, swelling, or lightheadedness.      CYP2C19 *2 allele (intermediate plavix metabolizer)    He is former [employer]	[place], originally from [place]. He works on [employer] currently, mostly Corporate treasurer. He has 6 cats, lives with his wife.   Father w/ mild stroke, mother w/ MI      Past Medical History  Patient Active Problem List   Diagnosis    Calculus of kidney    Enlarged prostate with (LUTS)    Encounter for long-term (current)  use of other medications    Testicular hypofunction    Renal colic    Essential hypertension    Hyperlipidemia    Type II or unspecified type diabetes mellitus without mention of complication, uncontrolled    OSA on CPAP    Hematochezia    Chronic idiopathic constipation    Coronary artery disease of native artery of native heart with stable angina pectoris (CMS-HCC)       Medications:  Current Outpatient Medications   Medication Sig Dispense Refill    aspirin (ECOTRIN) 81 MG tablet Take 1 tablet (81 mg total) by mouth daily. 30 tablet 11    carvedilol (COREG) 25 MG tablet Take 1 tablet (25 mg total) by mouth in the morning and 1 tablet (25 mg total) in the evening. Take with meals. 180 tablet 3    evolocumab (REPATHA SURECLICK) 140 mg/mL PnIj Inject the contents of one pen (140 mg) under the skin every fourteen (14) days. 2 mL 11    felodipine (PLENDIL) 10 MG 24 hr tablet Take 1 tablet (10 mg total) by mouth daily. 90 tablet 3    JARDIANCE 10 mg tablet Take 1 tablet (10 mg total) by mouth daily.      losartan-hydroCHLOROthiazide (HYZAAR) 100-12.5 mg per tablet Take 1 tablet by mouth daily. 90 tablet 3    CONTOUR NEXT TEST STRIPS Strp       empty container (SHARPS-A-GATOR DISPOSAL SYSTEM) Misc Use as directed for sharps disposal 1 each 2    nitroglycerin (NITROSTAT) 0.4 MG SL tablet Place 1 tablet (0.4 mg total) under the tongue every five (5) minutes as needed for chest pain. Maximum of 3 doses in 15 minutes. 25 tablet 6    prasugrel (EFFIENT) 10 mg tablet Take 1 tablet (10 mg total) by mouth daily. 30 tablet 11     No current facility-administered medications for this visit.       Allergies  Allergies   Allergen Reactions    Amoxicillin Rash    Penicillin Rash       Social History:   Social History     Tobacco Use    Smoking status: Never    Smokeless tobacco: Former   Haematologist status: Never Used   Substance Use Topics    Alcohol use: Yes     Comment: drinks once a blue moon    Drug use: No       Family History:  Family History   Problem Relation Age of Onset    Nephrolithiasis Father     Stroke Father     Nephrolithiasis Sister     Stroke Mother     Hypertension Brother     Prostate cancer Neg Hx     Kidney disease Neg Hx     GU problems Neg Hx        ROS- 12 system review is negative other than what is specified in the History of Present Illness.      Objective:   Physical Exam  Vitals:    12/05/22 1547   BP: 130/63   Pulse: 50   Resp: 20   SpO2: 94%   Weight: 99.1 kg (218 lb 6.4 oz)   Height: 172.7 cm (5' 8)       Wt Readings from Last 3 Encounters:   12/05/22 99.1 kg (218 lb 6.4 oz)   10/17/22 99.4 kg (219 lb 1.6 oz)   08/31/22 100.7 kg (222 lb)  General-  Normal appearing male in no apparent distress.  Neurologic- Alert and oriented X3.  Cranial nerve II-XII grossly intact.  HEENT-  Normocephalic atraumatic head.  No scleral icterus.  MMM  Neck- Supple, no carotid bruis, no JVD.  Lungs- Clear to auscultation, no wheezes, rhonchi, or rhales.  Heart-  RRR, no MRG.   Extremities-  No clubbing or cyanosis.  No lower extremity edema bilaterally.    Pulses- +2 pulses in radial and dorsalis pedis bilaterally.  Psych- Normal mood, appropriate.     Laboratory data:  No results found for: PROBNP    Lab Results   Component Value Date    WBC 7.2 10/26/2022    HGB 14.7 10/26/2022    HCT 42.8 10/26/2022    PLT 129 (L) 10/26/2022       Lab Results   Component Value Date    NA 140 10/26/2022    K 4.0 10/26/2022    CL 105 10/26/2022    CO2 28.0 10/26/2022    BUN 23 10/26/2022    CREATININE 1.05 10/26/2022    GLU 256 (H) 10/26/2022    CALCIUM 9.6 10/26/2022       Lab Results   Component Value Date    ALBUMIN 3.7 06/10/2022    ALT 34 06/10/2022    AST 14 06/10/2022    INR 1.01 06/09/2022       Lab Results   Component Value Date    LDL 29 (L) 10/12/2021    HDL 30 (L) 10/12/2021       Electrocardiogram:  From 04/10/19 showed NSR with inferior T wave inversions. No prior EKGs for comparison.     From 04/05/21 showed NSR, inferior T wave abnormality, largely unchanged.     08/31/22 - showed NSR, inferior T wave abnormality, unchanged.     Zio AT 09/15/22  Patient had a min HR of 48 bpm, max HR of 133 bpm, and avg HR of 62   bpm. Predominant underlying rhythm was Sinus Rhythm. 1 run of   Supraventricular Tachycardia occurred lasting 4 beats with a max rate of   103 bpm (avg 98 bpm). Isolated SVEs were rare (<1.0%), SVE Couplets   were rare (<1.0%), and no SVE Triplets were present. Isolated VEs were   rare (<1.0%), VE Couplets were rare (<1.0%), and no VE Triplets were   present. Ventricular Bigeminy was present.     No patient triggered events or diary entries.     Coronary CTA 10/14/22  - Study is limited due to artifact. There is diffuse coronary artery disease including probably obstructive disease in the proximal and mid LAD, as well as the mid RCA. Ostial/proximal RCA is not well visualized.     Nuclear stress test:  From 12/2004 at [facility] showed EF 70% without any stress-induced defects.     Nuclear stress 09/01/21  - Low risk, probably normal myocardial perfusion study   - No evidence of any significant ischemia or scar   - There is a very small in size, mild fixed perfusion abnormality involving the apical cap segments. This is consistent with probable apical thinning, a normal variant, given normal wall motion.   - Left ventricular systolic function is normal. Post stress the ejection fraction is > 65%. The left ventricle is normal in size.   - Scattered coronary calcifications were noted on the attenuation CT   - Incidental CT findings: Mediastinal nodes are somewhat more prominent than usual. There is a water density structure  adjacent to the heart on the right (3:28), likely a cyst.     Cardiac cath 10/26/22  - 2 vessel coronary artery disease with 99% stenosis of the mid LCx and 100% subtotal occlusion of the mid RCA  - Unable to cross the mid RCA lesion with 2.0 or 1.5 mm balloons  - Successful PCI of the mid circumflex with a Synergy 2.75x16 mm DES deployed; excellent angiographic result with TIMI 3 flow    Echocardiogram:  From 04/22/19 showed mild LVH, LVEF > 55%. There is grade I diastolic dysfunction (impaired relaxation). The left atrium is mildly dilated in size. The right ventricle is normal in size, with normal systolic function. The aorta at the ascending aorta is mildly dilated.      Glade Stanford, AGNP-C  Cardiology Nurse Practitioner  [facility]

## 2022-12-13 MED FILL — REPATHA SURECLICK 140 MG/ML SUBCUTANEOUS PEN INJECTOR: SUBCUTANEOUS | 28 days supply | Qty: 2 | Fill #3

## 2022-12-15 ENCOUNTER — Ambulatory Visit
Admit: 2022-12-15 | Discharge: 2022-12-16 | Payer: PRIVATE HEALTH INSURANCE | Attending: Physician Assistant | Primary: Physician Assistant

## 2022-12-15 DIAGNOSIS — K5904 Chronic idiopathic constipation: Principal | ICD-10-CM

## 2022-12-15 NOTE — Unmapped (Signed)
Magnesium can be alternated by 500 mg, 1 caplet and then alternate with 2 caplets or 1,000 mg and determine the texture and volume of your stools. I you feel that the texture is still too soft you can reduce further to 1, 500 mg on most days and then increase to 2, 500 mg caplets if you do not have a bowel movement on the 2nd or 3rd day you can increase back up to 1,000 mg.  You can also keep senna tablets 8.6 mg at the house in case you need a stronger laxative to help you have a bowel movement.  Let me know if there are questions with this.    Trellis Paganini MPAS, PA-C  [email or web address]  Fax   [phone]   For test results, medication questions or other issues, nurse's line, Lytle Butte, RN, [phone] or [phone]      For questions regarding scheduling GI procedures, please call, [phone] (option 1 and then option 2 for scheduling GI procedures)    For questions regarding radiology appointments, or to schedule, [phone]    For clinic appointments, [phone].

## 2022-12-15 NOTE — Unmapped (Signed)
[facility] NEW PATIENT VISIT      REFERRING PROVIDER:  Lynford Citizen, MD  [address]  PHS [address] [place],  [place] [address]    PRIMARY CARE PROVIDER:  Lynford Citizen, MD      PATIENT PROFILE:        George Moore is a 68 y.o. male (DOB: June 15, 1955) who has been referred by Dr. Richarda Blade for evaluation of constipation.    CHIEF COMPLAINT: Description of lifelong issues of constipation improved with the use of magnesium at 1000 mg daily now having a bowel movement only once every 3 to 4 days slightly smaller and looser with larger volume stools on occasion.    HISTORY OF PRESENT ILLNESS: This is a 67 y.o. year old male with history of constipation which he relates to occurring in childhood.  Some improvement of symptoms with the addition of 2, 500 mg caplets of magnesium from [place].  With this he has been having more regular stools on a daily basis since that time.  Only of the last 3 to 4 weeks he has been having a change in his bowel habit to more loose stools and going up to 3 to 4 days without having a bowel movement.  When he is able have a bowel movement he may have several small loosely formed stools in a day before he feels completely evacuated and then on occasion he may have a normal volume stool with good evacuation on day 3 or 4.  He denies any episodes of fecal loss.  He denies having any hard ball-like stools are difficult to evacuate.  He has had improvement of symptoms in regards to his abdominal bloating and distention and discomfort in regards to having an improved bowel habit.  He did complete his recent screening colonoscopy and had 1 polyp and otherwise was negative.  He denies any recent nausea or vomiting.  No fevers, chills, night sweats.     Problem List Items Addressed This Visit          Digestive    Chronic idiopathic constipation - Primary       PAST MEDICAL HISTORY:    Past Medical History:   Diagnosis Date    Calculus of kidney     Diabetes mellitus (CMS-HCC)     Encounter for long-term (current) use of other medications     Hives     Hypertension     Hypertrophy of prostate with urinary obstruction and other lower urinary tract symptoms (LUTS)     Other testicular hypofunction     Renal colic        PAST SURGICAL HISTORY:    Past Surgical History:   Procedure Laterality Date    ARTHROSCOPY KNEE W/ DRILLING      EXTRACORPOREAL SHOCK WAVE LITHOTRIPSY Right 12/06/2007    NASAL SEPTUM SURGERY      PR CATH PLACE/CORON ANGIO, IMG SUPER/INTERP,W LEFT HEART VENTRICULOGRAPHY N/A 10/26/2022    Procedure: Left Heart Catheterization With Intervention;  Surgeon: Autumn Messing, MD;  Location: [facility] CATH;  Service: Cardiology    PR COLONOSCOPY W/BIOPSY SINGLE/MULTIPLE N/A 06/14/2022    Procedure: COLONOSCOPY, FLEXIBLE, PROXIMAL TO SPLENIC FLEXURE; WITH BIOPSY, SINGLE OR MULTIPLE;  Surgeon: Kela Millin, MD;  Location: [facility];  Service: Gastroenterology       MEDICATIONS:      Current Outpatient Medications:     aspirin (ECOTRIN) 81 MG tablet, Take 1 tablet (81 mg  total) by mouth daily., Disp: 30 tablet, Rfl: 11    carvedilol (COREG) 25 MG tablet, Take 1 tablet (25 mg total) by mouth in the morning and 1 tablet (25 mg total) in the evening. Take with meals., Disp: 180 tablet, Rfl: 3    CONTOUR NEXT TEST STRIPS Strp, , Disp: , Rfl:     empty container (SHARPS-A-GATOR DISPOSAL SYSTEM) Misc, Use as directed for sharps disposal, Disp: 1 each, Rfl: 2    evolocumab (REPATHA SURECLICK) 140 mg/mL PnIj, Inject the contents of one pen (140 mg) under the skin every fourteen (14) days., Disp: 2 mL, Rfl: 11    felodipine (PLENDIL) 10 MG 24 hr tablet, Take 1 tablet (10 mg total) by mouth daily., Disp: 90 tablet, Rfl: 3    JARDIANCE 10 mg tablet, Take 1 tablet (10 mg total) by mouth daily., Disp: , Rfl:     losartan-hydroCHLOROthiazide (HYZAAR) 100-12.5 mg per tablet, Take 1 tablet by mouth daily., Disp: 90 tablet, Rfl: 3    nitroglycerin (NITROSTAT) 0.4 MG SL tablet, Place 1 tablet (0.4 mg total) under the tongue every five (5) minutes as needed for chest pain. Maximum of 3 doses in 15 minutes., Disp: 25 tablet, Rfl: 6    prasugrel (EFFIENT) 10 mg tablet, Take 1 tablet (10 mg total) by mouth daily., Disp: 30 tablet, Rfl: 11  (Not in a hospital admission)      ALLERGIES:    Amoxicillin and Penicillin    SOCIAL HISTORY:    Social History     Socioeconomic History    Marital status: Married     Spouse name: None    Number of children: None    Years of education: None    Highest education level: None   Tobacco Use    Smoking status: Never    Smokeless tobacco: Former   Haematologist status: Never Used   Substance and Sexual Activity    Alcohol use: Yes     Comment: drinks once a blue moon    Drug use: No       FAMILY HISTORY:    Family History   Problem Relation Age of Onset    Nephrolithiasis Father     Stroke Father     Nephrolithiasis Sister     Stroke Mother     Hypertension Brother     Prostate cancer Neg Hx     Kidney disease Neg Hx     GU problems Neg Hx          VITAL SIGNS:    BP 131/59 (BP Site: L Arm, BP Position: Sitting, BP Cuff Size: Large)  - Pulse 54  - Wt 99.2 kg (218 lb 9.6 oz)  - SpO2 97%  - BMI 33.24 kg/m??     PHYSICAL EXAM:    Constitutional:   Alert, oriented x 3, no acute distress, well nourished, and well hydrated.   Mental Status:   Thought organized, appropriate affect, pleasantly interactive, not anxious appearing.               Abdomen: Soft, normal bowel sounds, non-distended, non-tender, no organomegaly or masses.     Perianal/Rectal Exam Not performed.                         DIAGNOSTIC STUDIES:  I have reviewed all pertinent diagnostic studies, including:    GI Procedures:    Colonoscopy April 2024 internal hemorrhoids diverticuli otherwise negative  Radiographic studies:    Reviewed in Epic    Laboratory results:    Last hemoglobin A1c slightly over 8, random complete metabolic panel March 2024 elevated glucose at 210 otherwise normal        ASSESSMENT:        [name] is a 67 year old gentleman with a history of type 2 diabetes, history of chronic constipation.  Improved bowel habit with the addition of 1000 mg of magnesium daily.  Recent issues of going up to 3 to 4 days without having a bowel movement or having very soft small-volume stools multiple times in a day followed by occasions of more normal bowel movements but they are all softly formed.  We discussed alternating the magnesium to 1, 500 mg tablet alternating with 2, 500 mg tablets or 500 alternated with thousand in order to reduce overall magnesium and improve stool texture which may aid with evacuation and defecation of stools.  If stools continue to be very soft he may continue to reduce the dose of magnesium down to one 500 mg caplet nightly and only increasing to 2 when needed.  If he becomes more constipated I did suggest that he keep senna tablets on hand at the house in order to take these medications if he is gone up to 3 days without having a bowel movement.  He will let me know in case there is any questions or issues with this.  Overall I do feel his bowel pattern is significantly improved but dosing of laxative therapy will need to be somewhat titrated.      PLAN:          Magnesium can be alternated by 500 mg, 1 caplet and then alternate with 2 caplets or 1,000 mg and determine the texture and volume of your stools. I you feel that the texture is still too soft you can reduce further to 1, 500 mg on most days and then increase to 2, 500 mg caplets if you do not have a bowel movement on the 2nd or 3rd day you can increase back up to 1,000 mg.  You can also keep senna tablets 8.6 mg at the house in case you need a stronger laxative to help you have a bowel movement.  Let me know if there are questions with this.          I personally spent 45 minutes face-to-face and non-face-to-face in the care of this patient, which includes all pre, intra, and post visit time on the date of service.        Of note, patient's labs and clinic notes will now be released automatically to their MyChart for review. Patients will most likely receive their test results prior to the provider's ability to be able to review them. Please give at least 2-3 business days, so that I may have time to review the results, before expecting a response regarding the test results. This will be true for imaging studies, GI procedures, lab studies, etc. I appreciate your understanding and patience.     In regards to clinic notes, I will be as accurate as possible, but please understand that notes are generated through a Dragon dictation device and not all mistakes may be corrected before release.    Thank you for your understanding.    Trellis Paganini MPAS, PA-C        This dictation was done with [facility] Medical Naturally Speaking. Document was reviewed but inadvertent errors in transcription may be present  Thank you for your referral to [facility] clinic. As stated on the referral form that was completed at initiation of the referral of your patient; ???All new patients are seen for an initial consultation at the request of referring physicians. [facility] faculty will determine the need for transfer of care to [facility] at the time of initial consultation.??? Because our clinicians have limited clinic they are unable to provide walk-in or more urgent care in most cases. The patient will still need a local clinician to continue their care. [facility] does not accept ???transfer of care??? or a write in for ???consultation and treatment. Recommendations will be made to the patient and further care and clinic appointments will be discussed at the initial consultation. In the interim, the patient will continue to follow with his/her PCP and referring physician for general care and urgent matters. Management of current GI symptoms should also be done in coordination with PCP and/or referring GI physician.      Thank you.

## 2023-01-02 NOTE — Unmapped (Signed)
[facility] Refill Coordination Note    George Moore, DOB: [birth year]  Phone: [phone] (home)       All above HIPAA information was verified with patient.         01/01/2023     9:28 PM   Specialty Rx Medication Refill Questionnaire   Which Medications would you like refilled and shipped? Repatha   Please list all current allergies: Emoxicilln   Have you missed any doses in the last 30 days? No   Have you had any changes to your medication(s) since your last refill? No   How many days remaining of each medication do you have at home? 0   If receiving an injectable medication, next injection date is 01/11/2023   Have you experienced any side effects in the last 30 days? No   Please enter the full address (street address, city, state, zip code) where you would like your medication(s) to be delivered to. [address]  [place] [address]   Please specify on which day you would like your medication(s) to arrive. Note: if you need your medication(s) within 3 days, please call the pharmacy to schedule your order at [phone]  01/03/2023   Has your insurance changed since your last refill? No   Would you like a pharmacist to call you to discuss your medication(s)? No   Do you require a signature for your package? (Note: if we are billing Medicare Part B or your order contains a controlled substance, we will require a signature) No         Completed refill call assessment today to schedule patient's medication shipment from the [facility] [phone]).  All relevant notes have been reviewed.       Confirmed patient received a Conservation officer, historic buildings and a Surveyor, mining with first shipment. The patient will receive a drug information handout for each medication shipped and additional FDA Medication Guides as required.         REFERRAL TO PHARMACIST     Referral to the pharmacist: Not needed      [facility]     Shipping address confirmed in Epic.     Delivery Scheduled: Yes, Expected medication delivery date: 01/03/23.     Medication will be delivered via Same Day Courier to the prescription address in Epic WAM.    [name]   [facility] Specialty Technician

## 2023-01-03 MED FILL — REPATHA SURECLICK 140 MG/ML SUBCUTANEOUS PEN INJECTOR: SUBCUTANEOUS | 28 days supply | Qty: 2 | Fill #4

## 2023-01-17 ENCOUNTER — Institutional Professional Consult (permissible substitution): Admit: 2023-01-17 | Payer: BLUE CROSS/BLUE SHIELD

## 2023-01-17 NOTE — Unmapped (Signed)
George Moore reported to cardiac rehab today for his initial cardiac rehab evaluation following a stent. Staff educated on the procedures and expectations of cardiac rehab. Pt reported living over an hour away from rehab but due to insurance this is the only CR that will take the [facility]. Pt endorsed trying to switch to Medicaid but still waiting for approval. If approved pt will call a CR closer to his residence to start CR. All remaining questions were answered and [name] verbalized understanding of all recommendations and guidelines.

## 2023-02-06 MED FILL — REPATHA SURECLICK 140 MG/ML SUBCUTANEOUS PEN INJECTOR: SUBCUTANEOUS | 28 days supply | Qty: 2 | Fill #5

## 2023-02-06 NOTE — Unmapped (Signed)
Veryl Speak requested a refill of their Repatha via IVR/Web. The [facility] has scheduled delivery per the patients request via Same Day Courier to be delivered to their prescription address on 02/06/23.

## 2023-02-13 ENCOUNTER — Ambulatory Visit
Admit: 2023-02-13 | Discharge: 2023-02-14 | Payer: BLUE CROSS/BLUE SHIELD | Attending: Adult Health | Primary: Adult Health

## 2023-02-13 DIAGNOSIS — I25112 Coronary artery disease involving native coronary artery of native heart with refractory angina pectoris (CMS-HCC): Principal | ICD-10-CM

## 2023-02-13 DIAGNOSIS — E785 Hyperlipidemia, unspecified: Principal | ICD-10-CM

## 2023-02-13 DIAGNOSIS — I10 Essential (primary) hypertension: Principal | ICD-10-CM

## 2023-02-13 DIAGNOSIS — I202 Refractory angina pectoris (CMS-HCC): Principal | ICD-10-CM

## 2023-02-13 LAB — BASIC METABOLIC PANEL
ANION GAP: 13 mmol/L (ref 5–14)
BLOOD UREA NITROGEN: 15 mg/dL (ref 9–23)
BUN / CREAT RATIO: 15
CALCIUM: 10.1 mg/dL (ref 8.7–10.4)
CHLORIDE: 100 mmol/L (ref 98–107)
CO2: 29.3 mmol/L (ref 20.0–31.0)
CREATININE: 0.99 mg/dL (ref 0.73–1.18)
EGFR CKD-EPI (2021) MALE: 85 mL/min/{1.73_m2} (ref >=60)
GLUCOSE RANDOM: 227 mg/dL — ABNORMAL HIGH (ref 70–179)
POTASSIUM: 4 mmol/L (ref 3.4–4.8)
SODIUM: 142 mmol/L (ref 135–145)

## 2023-02-13 LAB — CBC
HEMATOCRIT: 43.7 % (ref 39.0–48.0)
HEMOGLOBIN: 15.1 g/dL (ref 12.9–16.5)
MEAN CORPUSCULAR HEMOGLOBIN CONC: 34.6 g/dL (ref 32.0–36.0)
MEAN CORPUSCULAR HEMOGLOBIN: 29.6 pg (ref 25.9–32.4)
MEAN CORPUSCULAR VOLUME: 85.7 fL (ref 77.6–95.7)
MEAN PLATELET VOLUME: 8.8 fL (ref 6.8–10.7)
PLATELET COUNT: 144 10*9/L — ABNORMAL LOW (ref 150–450)
RED BLOOD CELL COUNT: 5.1 10*12/L (ref 4.26–5.60)
RED CELL DISTRIBUTION WIDTH: 13.1 % (ref 12.2–15.2)
WBC ADJUSTED: 8.4 10*9/L (ref 3.6–11.2)

## 2023-02-13 LAB — LIPID PANEL
CHOLESTEROL/HDL RATIO SCREEN: 2.4 (ref 1.0–4.5)
CHOLESTEROL: 101 mg/dL (ref <=200)
HDL CHOLESTEROL: 42 mg/dL (ref 40–60)
LDL CHOLESTEROL CALCULATED: 35 mg/dL — ABNORMAL LOW (ref 40–99)
NON-HDL CHOLESTEROL: 59 mg/dL — ABNORMAL LOW (ref 70–130)
TRIGLYCERIDES: 119 mg/dL (ref 0–150)
VLDL CHOLESTEROL CAL: 23.8 mg/dL (ref 12–42)

## 2023-02-13 LAB — LDL CHOLESTEROL, DIRECT: LDL CHOLESTEROL DIRECT: 49 mg/dL

## 2023-02-13 NOTE — Unmapped (Signed)
Get labs    Plan for heart catheterization on Wednesday at main hospital - someone will call you.     If you have recurrent chest discomfort/symptoms that do no resolve after taking nitroglycerin (up to 3 doses x 5 min apart) then you need to call 911 or have someone bring you to ED

## 2023-02-13 NOTE — Unmapped (Signed)
Pt in for follow up. Pt has some pain between shoulder blades

## 2023-02-13 NOTE — Unmapped (Signed)
DIVISION OF CARDIOLOGY   [employer], [place]        Date of Service: 02/13/2023     Assessment/Plan   1. Essential hypertension  Patient with history of resistant HTN. Now well controlled.   Continue losartan 100 mg daily, HCTZ 12.5 mg daily, coreg 25 mg bid, felodipine 10 mg    2. CAD w/ refractory angina  Anginal equivalent: exertional dyspnea and interscapular tightness  S/p PCI to 99% mid LCX on 10/26/22. 100% subtotal RCA lesion was unable to be crossed.   Has had recurrent stable angina w/ some unstable features - 3-4 hr episode of chest fullness/discomfort yesterday at rest (did not take NTG). EKG today w/ worsening inferior TWI. No active chest pain.   D/w Dr. Verdis Frederickson who did PCI - plan for repeat [facility] Wednesday w/ possible RCA atherectomy if stent patent.   BMP, CBC, lipids  Continue ASA, prasugrel, coreg  Strict ED precautions rev'd, rev'd NTG use    3. HLD  Hx statin intolerance.   Now on Repatha w/ excellent lipid control.     Lab Results   Component Value Date    LDL 29 (L) 10/12/2021     4. T2DM  On Jardiance  Recommend considering GLP1 therapy    Lab Results   Component Value Date    A1C 6.6 07/23/2021     5. OSA  On CPAP, compliant. Followed by [facility] sleep clinic.        Return to clinic:  Return in about 6 weeks (around 03/27/2023).    I personally spent 45 minutes face-to-face and non-face-to-face in the care of this patient, which includes all pre, intra, and post visit time on the date of service.     Subjective:   ZOX:WRUEA, George Given, MD  Chief complaint:  67 y.o. male with history of HTN, diabetes, HLD, severe OSA on CPAP, fatty liver disease, CAD s/p PCI who presents for recurrent angina    History of Present Illness:  [name] was initially seen in this clinic for HTN mgmt.   Has hx of statin intolerance. Started on PCSK9i therapy.     This past summer, was having exertional dyspnea and interscapular tightness with heavier exertion, relieved with rest. He had abnormal coronary CTA and underwent PCI to 99% mid LCX 10/26/22. 100% subtotal RCA lesion was unable to be crossed w/ balloons.    He had a presyncopal episode at work in July - EMS assessed and no significant findings. Zio AT monitor showed no arrhythmias.      Last seen 12/05/22 - was doing well with minimal angina, back to work. Switched to prasugrel (intermediate plavix metabolizer).     Today - reports recurrent intrascapular pain. Has had to stop and rest at work multiple times.   Has used several NTG but no improvement.   Yesterday afternoon - felt like expanding lump of clay inside chest, felt heavy at rest - was under stress with wife. Lasted 3-4 hrs. Didn't take NTG. Also occurred at rest last week in evening. No nocturnal symptoms.   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   No symptoms today.   He is switching to medicare and won't be covered at [facility] in new year.      No presyncope/syncope, palpitations, swelling, or lightheadedness.      He is former [employer]	[place], originally from [place]. He works on [employer] currently, mostly Corporate treasurer. He has  6 cats, lives with his wife.   Father w/ mild stroke, mother w/ MI      Past Medical History  Patient Active Problem List   Diagnosis    Calculus of kidney    Enlarged prostate with (LUTS)    Encounter for long-term (current) use of other medications    Testicular hypofunction    Renal colic    Essential hypertension    Hyperlipidemia    Type II or unspecified type diabetes mellitus without mention of complication, uncontrolled    OSA on CPAP    Hematochezia    Chronic idiopathic constipation    Coronary artery disease of native artery of native heart with stable angina pectoris (CMS-HCC)    Refractory angina pectoris (CMS-HCC)       Medications:  Current Outpatient Medications   Medication Sig Dispense Refill    aspirin (ECOTRIN) 81 MG tablet Take 1 tablet (81 mg total) by mouth daily. 30 tablet 11    carvedilol (COREG) 25 MG tablet Take 1 tablet (25 mg total) by mouth in the morning and 1 tablet (25 mg total) in the evening. Take with meals. 180 tablet 3    CONTOUR NEXT TEST STRIPS Strp       empty container (SHARPS-A-GATOR DISPOSAL SYSTEM) Misc Use as directed for sharps disposal 1 each 2    evolocumab (REPATHA SURECLICK) 140 mg/mL PnIj Inject the contents of one pen (140 mg) under the skin every fourteen (14) days. 2 mL 11    felodipine (PLENDIL) 10 MG 24 hr tablet Take 1 tablet (10 mg total) by mouth daily. 90 tablet 3    JARDIANCE 10 mg tablet Take 1 tablet (10 mg total) by mouth daily.      losartan-hydroCHLOROthiazide (HYZAAR) 100-12.5 mg per tablet Take 1 tablet by mouth daily. 90 tablet 3    nitroglycerin (NITROSTAT) 0.4 MG SL tablet Place 1 tablet (0.4 mg total) under the tongue every five (5) minutes as needed for chest pain. Maximum of 3 doses in 15 minutes. 25 tablet 6    prasugrel (EFFIENT) 10 mg tablet Take 1 tablet (10 mg total) by mouth daily. 30 tablet 11     No current facility-administered medications for this visit.       Allergies  Allergies   Allergen Reactions    Statins-Hmg-Coa Reductase Inhibitors Muscle Pain    Amoxicillin Rash    Penicillin Rash       Social History:   Social History     Tobacco Use    Smoking status: Never    Smokeless tobacco: Former   Haematologist status: Never Used   Substance Use Topics    Alcohol use: Yes     Comment: drinks once a blue moon    Drug use: No       Family History:  Family History   Problem Relation Age of Onset    Nephrolithiasis Father     Stroke Father     Nephrolithiasis Sister     Stroke Mother     Hypertension Brother     Prostate cancer Neg Hx     Kidney disease Neg Hx     GU problems Neg Hx        ROS- 12 system review is negative other than what is specified in the History of Present Illness.      Objective:   Physical Exam  Vitals:    02/13/23 1410   BP: 134/65   Pulse:  58   SpO2: 98%   Weight: (!) 101.2 kg (223 lb 1.6 oz)       Wt Readings from Last 3 Encounters:   02/13/23 (!) 101.2 kg (223 lb 1.6 oz)   12/15/22 99.2 kg (218 lb 9.6 oz)   12/05/22 99.1 kg (218 lb 6.4 oz)     General-  Normal appearing male in no apparent distress.  Neurologic- Alert and oriented X3.  Cranial nerve II-XII grossly intact.  HEENT-  Normocephalic atraumatic head.  No scleral icterus.  MMM  Neck- Supple, no carotid bruis, no JVD.  Lungs- Clear to auscultation, no wheezes, rhonchi, or rhales.  Heart-  RRR, no MRG.   Extremities-  No clubbing or cyanosis.  No lower extremity edema bilaterally.    Pulses- +2 pulses in radial and dorsalis pedis bilaterally.  Psych- Normal mood, appropriate.     Laboratory data:  No results found for: PROBNP    Lab Results   Component Value Date    WBC 7.2 10/26/2022    HGB 14.7 10/26/2022    HCT 42.8 10/26/2022    PLT 129 (L) 10/26/2022       Lab Results   Component Value Date    NA 140 10/26/2022    K 4.0 10/26/2022    CL 105 10/26/2022    CO2 28.0 10/26/2022    BUN 23 10/26/2022    CREATININE 1.05 10/26/2022    GLU 256 (H) 10/26/2022    CALCIUM 9.6 10/26/2022       Lab Results   Component Value Date    ALBUMIN 3.7 06/10/2022    ALT 34 06/10/2022    AST 14 06/10/2022    INR 1.01 06/09/2022       Lab Results   Component Value Date    LDL 29 (L) 10/12/2021    HDL 30 (L) 10/12/2021       Electrocardiogram:  From 04/10/19 showed NSR with inferior T wave inversions. No prior EKGs for comparison.     From 04/05/21 showed NSR, inferior T wave abnormality, largely unchanged.     08/31/22 - showed NSR, inferior T wave abnormality, unchanged.     02/13/23 - sinus brady, worsening inferior TWI    Zio AT 09/15/22  Patient had a min HR of 48 bpm, max HR of 133 bpm, and avg HR of 62   bpm. Predominant underlying rhythm was Sinus Rhythm. 1 run of   Supraventricular Tachycardia occurred lasting 4 beats with a max rate of   103 bpm (avg 98 bpm). Isolated SVEs were rare (<1.0%), SVE Couplets   were rare (<1.0%), and no SVE Triplets were present. Isolated VEs were   rare (<1.0%), VE Couplets were rare (<1.0%), and no VE Triplets were   present. Ventricular Bigeminy was present.     No patient triggered events or diary entries.     Coronary CTA 10/14/22  - Study is limited due to artifact. There is diffuse coronary artery disease including probably obstructive disease in the proximal and mid LAD, as well as the mid RCA. Ostial/proximal RCA is not well visualized.     Nuclear stress test:  From 12/2004 at [facility] showed EF 70% without any stress-induced defects.     Nuclear stress 09/01/21  - Low risk, probably normal myocardial perfusion study   - No evidence of any significant ischemia or scar   - There is a very small in size, mild fixed perfusion abnormality involving the apical cap segments. This  is consistent with probable apical thinning, a normal variant, Moore normal wall motion.   - Left ventricular systolic function is normal. Post stress the ejection fraction is > 65%. The left ventricle is normal in size.   - Scattered coronary calcifications were noted on the attenuation CT   - Incidental CT findings: Mediastinal nodes are somewhat more prominent than usual. There is a water density structure adjacent to the heart on the right (3:28), likely a cyst.     Cardiac cath 10/26/22  - 2 vessel coronary artery disease with 99% stenosis of the mid LCx and 100% subtotal occlusion of the mid RCA  - Unable to cross the mid RCA lesion with 2.0 or 1.5 mm balloons  - Successful PCI of the mid circumflex with a Synergy 2.75x16 mm DES deployed; excellent angiographic result with TIMI 3 flow    Echocardiogram:  From 04/22/19 showed mild LVH, LVEF > 55%. There is grade I diastolic dysfunction (impaired relaxation). The left atrium is mildly dilated in size. The right ventricle is normal in size, with normal systolic function. The aorta at the ascending aorta is mildly dilated.      Glade Stanford, AGNP-C  Cardiology Nurse Practitioner  [facility]

## 2023-02-15 ENCOUNTER — Ambulatory Visit: Admit: 2023-02-15 | Discharge: 2023-02-15 | Payer: BLUE CROSS/BLUE SHIELD

## 2023-02-15 MED ADMIN — fentaNYL (PF) (SUBLIMAZE) injection: INTRAVENOUS | @ 13:00:00 | Stop: 2023-02-15

## 2023-02-15 MED ADMIN — heparin (porcine) 1000 unit/mL injection: INTRAVENOUS | @ 13:00:00 | Stop: 2023-02-15

## 2023-02-15 MED ADMIN — aspirin chewable tablet 243 mg: 243 mg | ORAL | @ 12:00:00 | Stop: 2023-02-15

## 2023-02-15 MED ADMIN — iohexol (OMNIPAQUE) 350 mg iodine/mL solution: INTRACORONARY | @ 14:00:00 | Stop: 2023-02-15

## 2023-02-15 MED ADMIN — verapamil (ISOPTIN) injection: INTRA_ARTERIAL | @ 14:00:00 | Stop: 2023-02-15

## 2023-02-15 MED ADMIN — lidocaine (PF) (XYLOCAINE-MPF) 20 mg/mL (2 %) injection: SUBCUTANEOUS | @ 13:00:00 | Stop: 2023-02-15

## 2023-02-15 MED ADMIN — midazolam (VERSED) injection: INTRAVENOUS | @ 13:00:00 | Stop: 2023-02-15

## 2023-02-15 MED ADMIN — heparin (porcine) 1000 unit/mL injection: INTRAVENOUS | @ 14:00:00 | Stop: 2023-02-15

## 2023-02-15 MED ADMIN — prasugrel (EFFIENT) tablet: ORAL | @ 14:00:00 | Stop: 2023-02-15

## 2023-02-15 MED ADMIN — verapamil (ISOPTIN) injection: INTRA_ARTERIAL | @ 13:00:00 | Stop: 2023-02-15

## 2023-02-15 NOTE — Unmapped (Signed)
PRELIMINARY CARDIAC CATHETERIZATION REPORT  (Full report to follow within 72 hours)  ____________________________________________________________________________     Findings:  1-vessel coronary artery disease. 99% mid RCA unchanged from prior angiogram 10/26/2022 with faint L to R collaterals. Mid LCX stent is widely patent.  Mildly elevated LVEDP = 19 mmHg.  Successful OCT-guided PCI of the mid RCA with a 2.5 x 28 mm Xience Skypoint DES, postdilated proximally to 2.75 mm. Excellent angiographic result with TIMI 3 flow.    Recommendations:  Aggressive secondary prevention.  DAPT with ASA 81 mg daily and prasugrel 10 mg daily for 6 months, followed by [facility] with prasugrel 10 mg daily indefinitely.  ____________________________________________________________________________                Date of cardiac procedure: 02/15/2023    Pre-op diagnosis: refractory angina, recent PCI  Post-op diagnosis: same     Performing Service:  Cardiology  Surgeons and Role:     * Verdis Frederickson, Tama High, MD - Primary     * Ng, Kirk Ruths, MD - Fellow - Diagnostic    Procedures performed:  Left heart catheterization  Coronary angiography  Optical coherence tomography  Percutaneous coronary intervention    Arterial access site: right radial artery    Left heart catheterization:  Left ventricular end diastolic pressure (LVEDP) = 19 mmHg    PCI details: Ikari Right 1.0 guide catheter    Runthrough coronary guidewire    95F Telescope guide extension    1.0 x 15 mm Sapphire Pro balloon    1.5 x 15 mm Sapphire Pro balloon    2.25 x 15 mm Sapphire Pro balloon    OCT    2.5 x 28 mm Xience Skypoint DES    2.75 x 15 mm Sapphire NC24 balloon  ____________________________________________________________________________                No specimens were taken.  Estimated blood loss: minimal.     I was present during the entire procedure.    Kathreen Devoid Verdis Frederickson, MD, [facility]  Assistant Professor of Medicine  Division of Cardiology  [facility]    02/15/2023 9:21 AM

## 2023-02-18 NOTE — Unmapped (Signed)
Placed form for Cardiac Rehab and recent EKG to Cardiac Rehab [phone].

## 2023-02-21 NOTE — Unmapped (Signed)
Pt would like to return to work on 02/21/23. Per Dr. Verdis Frederickson, pt may RTW and should call for a follow up in the next few weeks with Cassandra Ramm. Left message on pt's Voicemail with this information and work note provided via email and my Chart.

## 2023-02-28 NOTE — Unmapped (Signed)
[facility] Delivery Pharm[facility] Refill Coordination Note    Specialty Lite Medication(s) to be Shipped:   Repatha    Other medication(s) to be shipped: No additional medications requested for fill at this time     KAININ HOSP, DOB: June 28, 1955  Phone: [phone] (home)       All above HIPAA information was verified with patient.     Was a Nurse, learning disability used for this call? No    Changes to medications: Ngai reports no changes at this time.  Changes to insurance: No      REFERRAL TO PHARMACIST     Referral to the pharmacist: Not needed      [facility]     Shipping address confirmed in Epic.     Delivery Scheduled: Yes, Expected medication delivery date: 03/06/23.     Medication will be delivered via Same Day Courier to the prescription address in Epic WAM.    [name]   [facility] Delivery Pharm[facility] Specialty Technician

## 2023-03-06 ENCOUNTER — Ambulatory Visit
Admit: 2023-03-06 | Discharge: 2023-03-07 | Payer: BLUE CROSS/BLUE SHIELD | Attending: Adult Health | Primary: Adult Health

## 2023-03-06 DIAGNOSIS — E785 Hyperlipidemia, unspecified: Principal | ICD-10-CM

## 2023-03-06 DIAGNOSIS — I10 Essential (primary) hypertension: Principal | ICD-10-CM

## 2023-03-06 DIAGNOSIS — G4733 Obstructive sleep apnea (adult) (pediatric): Principal | ICD-10-CM

## 2023-03-06 DIAGNOSIS — I2510 Atherosclerotic heart disease of native coronary artery without angina pectoris: Principal | ICD-10-CM

## 2023-03-06 MED FILL — REPATHA SURECLICK 140 MG/ML SUBCUTANEOUS PEN INJECTOR: SUBCUTANEOUS | 28 days supply | Qty: 2 | Fill #6

## 2023-03-06 NOTE — Unmapped (Signed)
DIVISION OF CARDIOLOGY   [employer], [place]        Date of Service: 03/06/2023     Assessment/Plan   1. Essential hypertension  Patient with history of resistant HTN. Now well controlled.   Continue losartan 100 mg daily, HCTZ 12.5 mg daily, coreg 25 mg bid, felodipine 10 mg    2. CAD  Anginal equivalent: exertional dyspnea and interscapular tightness  S/p PCI to 99% mid LCX on 10/26/22. Had recent recurrent angina and now s/p PCI 02/15/23 to subtotal mRCA lesion with good result. Appears stable, no recurrent symptoms.   Continue ASA, prasugrel x 6 months, then prasugrel monotherapy  Continue coreg, repatha  Referral to cardiac rehab at [facility]    3. HLD  Hx statin intolerance.   Now on Repatha w/ excellent lipid control.     Lab Results   Component Value Date    LDL 35 (L) 02/13/2023    LDL 49.0 02/13/2023     4. T2DM  On Jardiance  Recommend considering GLP1 therapy    Lab Results   Component Value Date    A1C 6.6 07/23/2021     5. OSA  On CPAP, compliant. Followed by [facility] sleep clinic.        Return to clinic:  Return in about 6 months (around 09/04/2023).    Subjective:   UJW:[id number], [name], MD  Chief complaint:  67 y.o. male with history of HTN, diabetes, HLD, severe OSA on CPAP, fatty liver disease, CAD s/p PCI who presents for f/up after PCI    History of Present Illness:  Mr. Conkey was initially seen in this clinic for HTN mgmt.   Has hx of statin intolerance. Started on PCSK9i therapy.     This past summer, was having exertional dyspnea and interscapular tightness with heavier exertion, relieved with rest. He had abnormal coronary CTA and underwent PCI to 99% mid LCX 10/26/22. 100% subtotal RCA lesion was unable to be crossed w/ balloons.    He had a presyncopal episode at work in July - EMS assessed and no significant findings. Zio AT monitor showed no arrhythmias.      Recently had recurrent anginal symptoms and underwent PCI to mRCA lesion with good result.   Here today for f/up - had URI after PCI and wife was admitted over christmas for copd so has been very tired. He reports feeling better since stent. No recurrent angina but hasn't exerted himself much. Notes easy bruising.     States there is a cardiac rehab program in [place] and is interested in participating.     He is switching to medicare and unsure if he will be covered at [facility] in new year.     He is former [employer]	[place], originally from [place]. He works on [employer] currently, mostly Corporate treasurer. He has 6 cats, lives with his wife.   Father w/ mild stroke, mother w/ MI      Past Medical History  Patient Active Problem List   Diagnosis    Calculus of kidney    Enlarged prostate with (LUTS)    Encounter for long-term (current) use of other medications    Testicular hypofunction    Renal colic    Essential hypertension    Hyperlipidemia    Type II or unspecified type diabetes mellitus without mention of complication, uncontrolled    OSA on CPAP    Hematochezia    Chronic idiopathic constipation    Coronary  artery disease of native artery of native heart with stable angina pectoris (CMS-HCC)    Refractory angina pectoris (CMS-HCC)       Medications:  Current Outpatient Medications   Medication Sig Dispense Refill    aspirin (ECOTRIN) 81 MG tablet Take 1 tablet (81 mg total) by mouth daily. 30 tablet 11    carvedilol (COREG) 25 MG tablet Take 1 tablet (25 mg total) by mouth in the morning and 1 tablet (25 mg total) in the evening. Take with meals. 180 tablet 3    CONTOUR NEXT TEST STRIPS Strp       empty container (SHARPS-A-GATOR DISPOSAL SYSTEM) Misc Use as directed for sharps disposal 1 each 2    evolocumab (REPATHA SURECLICK) 140 mg/mL PnIj Inject the contents of one pen (140 mg) under the skin every fourteen (14) days. 2 mL 11    felodipine (PLENDIL) 10 MG 24 hr tablet Take 1 tablet (10 mg total) by mouth daily. 90 tablet 3    JARDIANCE 10 mg tablet Take 1 tablet (10 mg total) by mouth daily. losartan-hydroCHLOROthiazide (HYZAAR) 100-12.5 mg per tablet Take 1 tablet by mouth daily. 90 tablet 3    nitroglycerin (NITROSTAT) 0.4 MG SL tablet Place 1 tablet (0.4 mg total) under the tongue every five (5) minutes as needed for chest pain. Maximum of 3 doses in 15 minutes. 25 tablet 6    prasugrel (EFFIENT) 10 mg tablet Take 1 tablet (10 mg total) by mouth daily. 30 tablet 11     No current facility-administered medications for this visit.       Allergies  Allergies   Allergen Reactions    Statins-Hmg-Coa Reductase Inhibitors Muscle Pain    Amoxicillin Rash    Penicillin Rash       Social History:   Social History     Tobacco Use    Smoking status: Never    Smokeless tobacco: Former   Haematologist status: Never Used   Substance Use Topics    Alcohol use: Yes     Comment: drinks once a blue moon    Drug use: No       Family History:  Family History   Problem Relation Age of Onset    Stroke Mother     Nephrolithiasis Father     Stroke Father     Nephrolithiasis Sister     Hypertension Brother     Prostate cancer Neg Hx     Kidney disease Neg Hx     GU problems Neg Hx        ROS- 12 system review is negative other than what is specified in the History of Present Illness.      Objective:   Physical Exam  Vitals:    03/06/23 1425   BP: 117/58   BP Site: L Arm   BP Position: Sitting   BP Cuff Size: Medium   Pulse: 56   Temp: 36.6 ??C (97.9 ??F)   TempSrc: Temporal   SpO2: 97%   Weight: 98.2 kg (216 lb 8 oz)   Height: 172.7 cm (5' 8)       Wt Readings from Last 3 Encounters:   03/06/23 98.2 kg (216 lb 8 oz)   02/15/23 (!) 101.2 kg (223 lb)   02/13/23 (!) 101.2 kg (223 lb 1.6 oz)     General-  Normal appearing male in no apparent distress.  Neurologic- Alert and oriented X3.  Cranial nerve II-XII grossly intact.  HEENT-  Normocephalic atraumatic head.  No scleral icterus.  MMM  Neck- Supple, no carotid bruis, no JVD.  Lungs- Clear to auscultation, no wheezes, rhonchi, or rhales.  Heart-  RRR, no MRG. Extremities-  No clubbing or cyanosis.  No lower extremity edema bilaterally.    Pulses- +2 pulses in radial and dorsalis pedis bilaterally.  Psych- Normal mood, appropriate.     Laboratory data:  No results found for: PROBNP    Lab Results   Component Value Date    WBC 8.4 02/13/2023    HGB 15.1 02/13/2023    HCT 43.7 02/13/2023    PLT 144 (L) 02/13/2023       Lab Results   Component Value Date    NA 142 02/13/2023    K 4.0 02/13/2023    CL 100 02/13/2023    CO2 29.3 02/13/2023    BUN 15 02/13/2023    CREATININE 0.99 02/13/2023    GLU 227 (H) 02/13/2023    CALCIUM 10.1 02/13/2023       Lab Results   Component Value Date    ALBUMIN 3.7 06/10/2022    ALT 34 06/10/2022    AST 14 06/10/2022    INR 1.01 06/09/2022       Lab Results   Component Value Date    LDL 35 (L) 02/13/2023    LDL 49.0 02/13/2023    HDL 42 02/13/2023       Electrocardiogram:  From 04/10/19 showed NSR with inferior T wave inversions. No prior EKGs for comparison.     From 04/05/21 showed NSR, inferior T wave abnormality, largely unchanged.     08/31/22 - showed NSR, inferior T wave abnormality, unchanged.     02/13/23 - sinus brady, worsening inferior TWI    Zio AT 09/15/22  Patient had a min HR of 48 bpm, max HR of 133 bpm, and avg HR of 62   bpm. Predominant underlying rhythm was Sinus Rhythm. 1 run of   Supraventricular Tachycardia occurred lasting 4 beats with a max rate of   103 bpm (avg 98 bpm). Isolated SVEs were rare (<1.0%), SVE Couplets   were rare (<1.0%), and no SVE Triplets were present. Isolated VEs were   rare (<1.0%), VE Couplets were rare (<1.0%), and no VE Triplets were   present. Ventricular Bigeminy was present.     No patient triggered events or diary entries.     Coronary CTA 10/14/22  - Study is limited due to artifact. There is diffuse coronary artery disease including probably obstructive disease in the proximal and mid LAD, as well as the mid RCA. Ostial/proximal RCA is not well visualized.     Nuclear stress test:  From 12/2004 at [facility] showed EF 70% without any stress-induced defects.     Nuclear stress 09/01/21  - Low risk, probably normal myocardial perfusion study   - No evidence of any significant ischemia or scar   - There is a very small in size, mild fixed perfusion abnormality involving the apical cap segments. This is consistent with probable apical thinning, a normal variant, given normal wall motion.   - Left ventricular systolic function is normal. Post stress the ejection fraction is > 65%. The left ventricle is normal in size.   - Scattered coronary calcifications were noted on the attenuation CT   - Incidental CT findings: Mediastinal nodes are somewhat more prominent than usual. There is a water density structure adjacent to the heart on the right (3:28), likely  a cyst.     Cardiac cath 10/26/22  - 2 vessel coronary artery disease with 99% stenosis of the mid LCx and 100% subtotal occlusion of the mid RCA  - Unable to cross the mid RCA lesion with 2.0 or 1.5 mm balloons  - Successful PCI of the mid circumflex with a Synergy 2.75x16 mm DES deployed; excellent angiographic result with TIMI 3 flow    Cardiac cath 02/15/23  - 1-vessel coronary artery disease. 99% mid RCA unchanged from prior angiogram 10/26/2022 with faint L to R collaterals. Mid LCX stent is widely patent  - Mildly elevated LVEDP = 19 mmHg.  - Successful OCT-guided PCI of the mid RCA with a 2.5 x 28 mm Xience Skypoint DES, post-dilated proximally to 2.75 mm. Excellent angiographic result with TIMI 3 flow.     Echocardiogram:  From 04/22/19 showed mild LVH, LVEF > 55%. There is grade I diastolic dysfunction (impaired relaxation). The left atrium is mildly dilated in size. The right ventricle is normal in size, with normal systolic function. The aorta at the ascending aorta is mildly dilated.      Glade Stanford, AGNP-C  Cardiology Nurse Practitioner  [facility]

## 2023-03-06 NOTE — Unmapped (Signed)
Referral to [facility] Cardiac Rehab    Continue aspirin and prasugrel for 6 months, then can stop aspirin

## 2023-03-07 NOTE — Unmapped (Signed)
I have printed out all of chart info needed for Cardiac Rehab at [facility] in [place].  I have called [phone] and lm for their fax number, then I will fax referral tomorrow.

## 2023-03-09 NOTE — Unmapped (Signed)
Faxed to [facility] Cardiac Re[facility] signed order and recent EKG to [phone]

## 2023-03-29 NOTE — Unmapped (Signed)
George Moore requested a refill of their Repatha via IVR/Web. The [facility] has scheduled delivery per the patients request via Same Day Courier to be delivered to their prescription address on 03/31/23.

## 2023-03-31 MED FILL — REPATHA SURECLICK 140 MG/ML SUBCUTANEOUS PEN INJECTOR: SUBCUTANEOUS | 28 days supply | Qty: 2 | Fill #7

## 2023-04-21 NOTE — Unmapped (Signed)
 [facility] Special[facility] and Home Delivery Pharmacy Refill Coordination Note    Specialty Lite Medication(s) to be Shipped:   Repatha    Other medication(s) to be shipped: No additional medications requested for fill at this time     WESTLEE DEVITA, DOB: [birth year]  Phone: [phone] (home)       All above HIPAA information was verified with patient.     Was a Nurse, learning disability used for this call? No    Changes to medications: Shamarcus reports no changes at this time.  Changes to insurance: No      REFERRAL TO PHARMACIST     Referral to the pharmacist: Not needed      [facility]     Shipping address confirmed in Epic.     Delivery Scheduled: Yes, Expected medication delivery date: 04/25/23.     Medication will be delivered via Same Day Courier to the prescription address in Epic WAM.    [name]   [facility] Home Delivery Pharmacy Specialty Technician

## 2023-04-25 MED FILL — REPATHA SURECLICK 140 MG/ML SUBCUTANEOUS PEN INJECTOR: SUBCUTANEOUS | 28 days supply | Qty: 2 | Fill #8

## 2023-05-16 NOTE — Unmapped (Signed)
 [facility] Delivery Pharm[facility] Refill Coordination Note    Specialty Lite Medication(s) to be Shipped:   Repatha    Other medication(s) to be shipped: No additional medications requested for fill at this time     George Moore, DOB: 1955/11/15  Phone: [phone] (home)       All above HIPAA information was verified with patient.     Was a Nurse, learning disability used for this call? No    Changes to medications: [name] reports no changes at this time.  Changes to insurance: Yes: [employer] Medicare Part D transitional fill; messaged Triage for PA referral      REFERRAL TO PHARMACIST     Referral to the pharmacist: Not needed      SHIPPING     Shipping address confirmed in Epic.     Delivery Scheduled: Yes, Expected medication delivery date: 3/13.     Medication will be delivered via Same Day Courier to the prescription address in Epic WAM.    [name]

## 2023-05-17 DIAGNOSIS — E785 Hyperlipidemia, unspecified: Principal | ICD-10-CM

## 2023-05-17 DIAGNOSIS — Z9189 Other specified personal risk factors, not elsewhere classified: Principal | ICD-10-CM

## 2023-05-18 MED FILL — REPATHA SURECLICK 140 MG/ML SUBCUTANEOUS PEN INJECTOR: SUBCUTANEOUS | 28 days supply | Qty: 2 | Fill #9

## 2023-05-29 NOTE — Unmapped (Signed)
 Complex Case Management  SUMMARY NOTE    Care Management Assistant spoke with patient and verified correct patient using two identifiers today to introduce the Complex Case Management program.     Discussed the following:  Program Services    Program status: Interested Patient is working    Discuss at next visit: Introduction to Complex Case Management    Scheduled for: later with Care Management Assistant    [facility] Management Assistant  [facility]  [address]  [place], [place] [address]  She/Her/Hers  P: [phone] F: [phone]  [email or web address]

## 2023-05-31 ENCOUNTER — Ambulatory Visit: Admit: 2023-05-31 | Discharge: 2023-06-01 | Payer: MEDICARE | Attending: Adult Health | Primary: Adult Health

## 2023-05-31 DIAGNOSIS — G4733 Obstructive sleep apnea (adult) (pediatric): Principal | ICD-10-CM

## 2023-05-31 DIAGNOSIS — I2510 Atherosclerotic heart disease of native coronary artery without angina pectoris: Principal | ICD-10-CM

## 2023-05-31 DIAGNOSIS — E785 Hyperlipidemia, unspecified: Principal | ICD-10-CM

## 2023-05-31 DIAGNOSIS — I10 Essential (primary) hypertension: Principal | ICD-10-CM

## 2023-05-31 NOTE — Unmapped (Signed)
 Pt in for follow up.  Has had some recent chest  pains not with exertion, NTG has not helped

## 2023-05-31 NOTE — Unmapped (Signed)
 DIVISION OF CARDIOLOGY   [facility], [place]        Date of Service: 05/31/2023     Assessment/Plan   1. Essential hypertension  Patient with history of resistant HTN. Now well controlled.   Continue losartan 100 mg daily, HCTZ 12.5 mg daily, coreg 25 mg bid, felodipine 10 mg    2. CAD  Anginal equivalent: exertional dyspnea and interscapular tightness - no recurrence. Has had some brief atypical chest pain that seems non-cardiac in nature. EKG today w/o changes.   S/p PCI to 99% mid LCX on 10/26/22. Had recent recurrent angina and now s/p PCI 02/15/23 to subtotal mRCA lesion with good result. Appears stable, no recurrent symptoms.   Continue ASA, prasugrel x 6 months, then prasugrel monotherapy  Continue coreg, repatha  F/up on cardiac rehab at [facility] - was referred and he wants to participate  Again discussed GLP1 therapy but not yet ready    3. HLD  Hx statin intolerance.   Now on Repatha w/ excellent lipid control.     Lab Results   Component Value Date    LDL 35 (L) 02/13/2023    LDL 49.0 02/13/2023     4. T2DM  On Jardiance  Recommend considering GLP1 therapy    Lab Results   Component Value Date    A1C 6.6 07/23/2021     5. OSA  On CPAP, compliant. Followed by [facility].        Return to [facility]:  Return in about 6 months (around 12/01/2023).    Subjective:   UYQ:IHKVQ, [facility], [facility]  Chief complaint:  68 y.o. male with history of HTN, diabetes, HLD, severe OSA on CPAP, fatty liver disease, CAD s/p PCI who presents for f/up of CAD    History of Present Illness:  [facility] seen in [facility] for HTN mgmt.   Has hx of statin intolerance. Started on PCSK9i therapy.     This past summer, was having exertional dyspnea and interscapular tightness with heavier exertion, relieved with rest. He had abnormal coronary CTA and underwent PCI to 99% mid LCX 10/26/22. 100% subtotal RCA lesion was unable to be crossed w/ balloons.    Had recent recurrent angina and now s/p PCI 02/15/23 to subtotal mRCA lesion with good result.    Last seen 03/06/23 and was doing well. Referred to cardiac rehab.    Here today for f/up. Has had a few episodes of chest pain randomly occurring. Has taken NTG a few times but didn't notice much difference. No longer having exertional interscapular tightness. He is able to lift boxes at work and do his normal activities. Notes he is out of shape- was a chaperone for grandson's field trip and had to stop and rest.    Heard from cardiac rehab but hasn't started.     He is former [employer]	[place], originally from [place]. He works on [employer] currently, mostly Corporate treasurer. He has 6 cats, lives with his wife.   Father w/ mild stroke, mother w/ MI      Past Medical History  Patient Active Problem List   Diagnosis    Calculus of kidney    Enlarged prostate with (LUTS)    Encounter for long-term (current) use of other medications    Testicular hypofunction    Renal colic    Essential hypertension    Hyperlipidemia    Type II or unspecified type diabetes mellitus without mention of  complication, uncontrolled    OSA on CPAP    Hematochezia    Chronic idiopathic constipation    Coronary artery disease of native artery of native heart with stable angina pectoris (CMS-HCC)    Refractory angina pectoris (CMS-HCC)       Medications:  Current Outpatient Medications   Medication Sig Dispense Refill    aspirin (ECOTRIN) 81 MG tablet Take 1 tablet (81 mg total) by mouth daily. 30 tablet 11    carvedilol (COREG) 25 MG tablet Take 1 tablet (25 mg total) by mouth in the morning and 1 tablet (25 mg total) in the evening. Take with meals. 180 tablet 3    empty container (SHARPS-A-GATOR DISPOSAL SYSTEM) Misc Use as directed for sharps disposal 1 each 2    evolocumab (REPATHA SURECLICK) 140 mg/mL PnIj Inject the contents of one pen (140 mg) under the skin every fourteen (14) days. 2 mL 11    felodipine (PLENDIL) 10 MG 24 hr tablet Take 1 tablet (10 mg total) by mouth daily. 90 tablet 3    JARDIANCE 10 mg tablet Take 1 tablet (10 mg total) by mouth daily.      losartan-hydroCHLOROthiazide (HYZAAR) 100-12.5 mg per tablet Take 1 tablet by mouth daily. 90 tablet 3    nitroglycerin (NITROSTAT) 0.4 MG SL tablet Place 1 tablet (0.4 mg total) under the tongue every five (5) minutes as needed for chest pain. Maximum of 3 doses in 15 minutes. 25 tablet 6    prasugrel (EFFIENT) 10 mg tablet Take 1 tablet (10 mg total) by mouth daily. 30 tablet 11    CONTOUR NEXT TEST STRIPS Strp  (Patient not taking: Reported on 05/31/2023)       No current facility-administered medications for this visit.       Allergies  Allergies   Allergen Reactions    Statins-Hmg-Coa Reductase Inhibitors Muscle Pain    Amoxicillin Rash    Penicillin Rash       Social History:   Social History     Tobacco Use    Smoking status: Never    Smokeless tobacco: Former   Haematologist status: Never Used   Substance Use Topics    Alcohol use: Yes     Comment: drinks once a blue moon    Drug use: No       Family History:  Family History   Problem Relation Age of Onset    Stroke Mother     Nephrolithiasis Father     Stroke Father     Nephrolithiasis Sister     Hypertension Brother     Prostate cancer Neg Hx     Kidney disease Neg Hx     GU problems Neg Hx        ROS- 12 system review is negative other than what is specified in the History of Present Illness.      Objective:   Physical Exam  Vitals:    05/31/23 1417   BP: 127/59   BP Site: R Arm   BP Position: Sitting   Pulse: 52   SpO2: 97%   Weight: 100.3 kg (221 lb 1.6 oz)       Wt Readings from Last 3 Encounters:   05/31/23 100.3 kg (221 lb 1.6 oz)   03/06/23 98.2 kg (216 lb 8 oz)   02/15/23 (!) 101.2 kg (223 lb)     General-  Normal appearing male in no apparent distress.  Neurologic- Alert and oriented  X3.  Cranial nerve II-XII grossly intact.  HEENT-  Normocephalic atraumatic head.  No scleral icterus.  MMM  Neck- Supple, no carotid bruis, no JVD.  Lungs- Clear to auscultation, no wheezes, rhonchi, or rhales.  Heart-  RRR, no MRG.   Extremities-  No clubbing or cyanosis.  No lower extremity edema bilaterally.    Pulses- +2 pulses in radial and dorsalis pedis bilaterally.  Psych- Normal mood, appropriate.     Laboratory data:  No results found for: PROBNP    Lab Results   Component Value Date    WBC 8.4 02/13/2023    HGB 15.1 02/13/2023    HCT 43.7 02/13/2023    PLT 144 (L) 02/13/2023       Lab Results   Component Value Date    NA 142 02/13/2023    K 4.0 02/13/2023    CL 100 02/13/2023    CO2 29.3 02/13/2023    BUN 15 02/13/2023    CREATININE 0.99 02/13/2023    GLU 227 (H) 02/13/2023    CALCIUM 10.1 02/13/2023       Lab Results   Component Value Date    ALBUMIN 3.7 06/10/2022    ALT 34 06/10/2022    AST 14 06/10/2022    INR 1.01 06/09/2022       Lab Results   Component Value Date    LDL 35 (L) 02/13/2023    LDL 49.0 02/13/2023    HDL 42 02/13/2023       Electrocardiogram:  From 04/10/19 showed NSR with inferior T wave inversions. No prior EKGs for comparison.     From 04/05/21 showed NSR, inferior T wave abnormality, largely unchanged.     08/31/22 - showed NSR, inferior T wave abnormality, unchanged.     02/13/23 - sinus brady, inferior TWI    05/31/23 - sinus brady, inferior TWI - unchanged    Zio AT 09/15/22  Patient had a min HR of 48 bpm, max HR of 133 bpm, and avg HR of 62   bpm. Predominant underlying rhythm was Sinus Rhythm. 1 run of   Supraventricular Tachycardia occurred lasting 4 beats with a max rate of   103 bpm (avg 98 bpm). Isolated SVEs were rare (<1.0%), SVE Couplets   were rare (<1.0%), and no SVE Triplets were present. Isolated VEs were   rare (<1.0%), VE Couplets were rare (<1.0%), and no VE Triplets were   present. Ventricular Bigeminy was present.     No patient triggered events or diary entries.     Coronary CTA 10/14/22  - Study is limited due to artifact. There is diffuse coronary artery disease including probably obstructive disease in the proximal and mid LAD, as well as the mid RCA. Ostial/proximal RCA is not well visualized.     Nuclear stress test:  From 12/2004 at [facility] showed EF 70% without any stress-induced defects.     Nuclear stress 09/01/21  - Low risk, probably normal myocardial perfusion study   - No evidence of any significant ischemia or scar   - There is a very small in size, mild fixed perfusion abnormality involving the apical cap segments. This is consistent with probable apical thinning, a normal variant, [facility] normal wall motion.   - Left ventricular systolic function is normal. Post stress the ejection fraction is > 65%. The left ventricle is normal in size.   - Scattered coronary calcifications were noted on the attenuation CT   - Incidental CT findings: Mediastinal nodes are  somewhat more prominent than usual. There is a water density structure adjacent to the heart on the right (3:28), likely a cyst.     Cardiac cath 10/26/22  - 2 vessel coronary artery disease with 99% stenosis of the mid LCx and 100% subtotal occlusion of the mid RCA  - Unable to cross the mid RCA lesion with 2.0 or 1.5 mm balloons  - Successful PCI of the mid circumflex with a Synergy 2.75x16 mm DES deployed; excellent angiographic result with TIMI 3 flow    Cardiac cath 02/15/23  - 1-vessel coronary artery disease. 99% mid RCA unchanged from prior angiogram 10/26/2022 with faint L to R collaterals. Mid LCX stent is widely patent  - Mildly elevated LVEDP = 19 mmHg.  - Successful OCT-guided PCI of the mid RCA with a 2.5 x 28 mm Xience Skypoint DES, post-dilated proximally to 2.75 mm. Excellent angiographic result with TIMI 3 flow.     Echocardiogram:  From 04/22/19 showed mild LVH, LVEF > 55%. There is grade I diastolic dysfunction (impaired relaxation). The left atrium is mildly dilated in size. The right ventricle is normal in size, with normal systolic function. The aorta at the ascending aorta is mildly dilated.      Glade Stanford, AGNP-C  Cardiology Nurse Practitioner  [facility]

## 2023-05-31 NOTE — Unmapped (Signed)
 Will contact [facility]

## 2023-06-01 NOTE — Unmapped (Signed)
 Referral faxed to cardiac rehab in [place] per patient request. Left patient VM stating referral sent and he can call to schedule at [phone].

## 2023-06-01 NOTE — Unmapped (Signed)
 Complex Case Management  SUMMARY NOTE    Care Management Assistant spoke with patient and verified correct patient using two identifiers today to introduce the Complex Case Management program.     Discussed the following:  Program Services    Program status: Interested Prefers a Physicist, medical. Patient was working and did not have time to discuss the program.    Discuss at next visit: Introduction to Complex Case Management  Letter sent.    [facility] Management Assistant  [facility]  [address], [address]  [place], [place] [address]  She/Her/Hers  P: [phone] F: [phone]  [email or web address]

## 2023-06-01 NOTE — Unmapped (Signed)
 I have called [facility] and requested that they please contact pt again to schedule for Cardiac Rehab.  They said his insurance was out of Network.  [employer] Medicare Adv [id number] Insurance was [employer].  I will ask pt if he wants to go to [facility] or [place].  He will call and lm of where he would like for me to send his paperwork.

## 2023-06-06 ENCOUNTER — Encounter (HOSPITAL_COMMUNITY)
Admission: RE | Admit: 2023-06-06 | Discharge: 2023-06-06 | Disposition: A | Discharge status: Home / Self Care | Source: Ambulatory Visit | Attending: Cardiology | Admitting: Cardiology

## 2023-06-06 DIAGNOSIS — Z79899 Other long term (current) drug therapy: Secondary | ICD-10-CM | POA: Insufficient documentation

## 2023-06-06 DIAGNOSIS — I2510 Atherosclerotic heart disease of native coronary artery without angina pectoris: Secondary | ICD-10-CM

## 2023-06-06 DIAGNOSIS — Z48812 Encounter for surgical aftercare following surgery on the circulatory system: Secondary | ICD-10-CM | POA: Insufficient documentation

## 2023-06-06 DIAGNOSIS — Z9889 Other specified postprocedural states: Secondary | ICD-10-CM | POA: Insufficient documentation

## 2023-06-06 DIAGNOSIS — Z95820 Peripheral vascular angioplasty status with implants and grafts: Secondary | ICD-10-CM

## 2023-06-06 DIAGNOSIS — Z7984 Long term (current) use of oral hypoglycemic drugs: Secondary | ICD-10-CM | POA: Insufficient documentation

## 2023-06-06 NOTE — Progress Notes (Signed)
 Completed virtual orientation today.  EP evaluation is scheduled for 06/13/23 at 1:00 pm.  Documentation for diagnosis can be found in [facility] encounter 05/31/23.

## 2023-06-13 ENCOUNTER — Encounter (HOSPITAL_COMMUNITY)
Admission: RE | Admit: 2023-06-13 | Discharge: 2023-06-13 | Disposition: A | Discharge status: Home / Self Care | Source: Ambulatory Visit | Attending: Cardiology | Admitting: Cardiology

## 2023-06-13 VITALS — Ht 67.5 in | Wt 219.2 lb

## 2023-06-13 DIAGNOSIS — I2510 Atherosclerotic heart disease of native coronary artery without angina pectoris: Secondary | ICD-10-CM | POA: Diagnosis present

## 2023-06-13 DIAGNOSIS — Z7984 Long term (current) use of oral hypoglycemic drugs: Secondary | ICD-10-CM | POA: Diagnosis not present

## 2023-06-13 DIAGNOSIS — Z48812 Encounter for surgical aftercare following surgery on the circulatory system: Secondary | ICD-10-CM | POA: Diagnosis not present

## 2023-06-13 DIAGNOSIS — Z79899 Other long term (current) drug therapy: Secondary | ICD-10-CM | POA: Diagnosis not present

## 2023-06-13 DIAGNOSIS — Z95820 Peripheral vascular angioplasty status with implants and grafts: Secondary | ICD-10-CM

## 2023-06-13 DIAGNOSIS — Z9889 Other specified postprocedural states: Secondary | ICD-10-CM | POA: Diagnosis not present

## 2023-06-13 NOTE — Patient Instructions (Signed)
 Patient Instructions  Patient Details  Name: Lee Adkins MRN: [id number] Date of Birth: 09/18/1955 Referring Provider:  Autumn Messing, MD  Below are your personal goals for exercise, nutrition, and risk factors. Our goal is to help you stay on track towards obtaining and maintaining these goals. We will be discussing your progress on these goals with you throughout the program.  Initial Exercise Prescription:  Initial Exercise Prescription - 06/13/23 1400       Date of Initial Exercise RX and Referring Provider   Date 06/13/23    Referring Provider Gwynne Edinger MD      Oxygen   Maintain Oxygen Saturation 88% or higher      NuStep   Level 3    SPM 80    Minutes 15    METs 2.7      REL-XR   Level 2    Speed 50    Minutes 15    METs 2.7      Prescription Details   Frequency (times per week) 3    Duration Progress to 30 minutes of continuous aerobic without signs/symptoms of physical distress      Intensity   THRR 40-80% of Max Heartrate 97-136    Ratings of Perceived Exertion 11-13    Perceived Dyspnea 0-4      Progression   Progression Continue to progress workloads to maintain intensity without signs/symptoms of physical distress.      Resistance Training   Training Prescription Yes    Weight 4 lb    Reps 10-15             Exercise Goals: Frequency: Be able to perform aerobic exercise two to three times per week in program working toward 2-5 days per week of home exercise.  Intensity: Work with a perceived exertion of 11 (fairly light) - 15 (hard) while following your exercise prescription.  We will make changes to your prescription with you as you progress through the program.   Duration: Be able to do 30 to 45 minutes of continuous aerobic exercise in addition to a 5 minute warm-up and a 5 minute cool-down routine.   Nutrition Goals: Your personal nutrition goals will be established when you do your nutrition analysis with the  dietician.  The following are general nutrition guidelines to follow: Cholesterol < 200mg /day Sodium < 1500mg /day Fiber: Men over 50 yrs - 30 grams per day  Personal Goals:  Personal Goals and Risk Factors at Admission - 06/13/23 1414       Core Components/Risk Factors/Patient Goals on Admission    Weight Management Yes;Obesity;Weight Loss    Intervention Weight Management: Develop a combined nutrition and exercise program designed to reach desired caloric intake, while maintaining appropriate intake of nutrient and fiber, sodium and fats, and appropriate energy expenditure required for the weight goal.;Weight Management: Provide education and appropriate resources to help participant work on and attain dietary goals.;Weight Management/Obesity: Establish reasonable short term and long term weight goals.;Obesity: Provide education and appropriate resources to help participant work on and attain dietary goals.    Admit Weight 219 lb 3.2 oz (99.4 kg)    Goal Weight: Short Term 214 lb (97.1 kg)    Goal Weight: Long Term 209 lb (94.8 kg)    Expected Outcomes Short Term: Continue to assess and modify interventions until short term weight is achieved;Long Term: Adherence to nutrition and physical activity/exercise program aimed toward attainment of established weight goal;Weight Loss: Understanding of general recommendations for a balanced  deficit meal plan, which promotes 1-2 lb weight loss per week and includes a negative energy balance of [phone] kcal/d;Understanding recommendations for meals to include 15-35% energy as protein, 25-35% energy from fat, 35-60% energy from carbohydrates, less than 200mg  of dietary cholesterol, 20-35 gm of total fiber daily;Understanding of distribution of calorie intake throughout the day with the consumption of 4-5 meals/snacks    Diabetes Yes    Intervention Provide education about signs/symptoms and action to take for hypo/hyperglycemia.;Provide education about  proper nutrition, including hydration, and aerobic/resistive exercise prescription along with prescribed medications to achieve blood glucose in normal ranges: Fasting glucose 65-99 mg/dL    Expected Outcomes Short Term: Participant verbalizes understanding of the signs/symptoms and immediate care of hyper/hypoglycemia, proper foot care and importance of medication, aerobic/resistive exercise and nutrition plan for blood glucose control.;Long Term: Attainment of HbA1C < 7%.    Hypertension Yes    Intervention Provide education on lifestyle modifcations including regular physical activity/exercise, weight management, moderate sodium restriction and increased consumption of fresh fruit, vegetables, and low fat dairy, alcohol moderation, and smoking cessation.;Monitor prescription use compliance.    Expected Outcomes Short Term: Continued assessment and intervention until BP is < 140/20mm HG in hypertensive participants. < 130/23mm HG in hypertensive participants with diabetes, heart failure or chronic kidney disease.;Long Term: Maintenance of blood pressure at goal levels.    Lipids Yes    Intervention Provide education and support for participant on nutrition & aerobic/resistive exercise along with prescribed medications to achieve LDL 70mg , HDL >40mg .    Expected Outcomes Short Term: Participant states understanding of desired cholesterol values and is compliant with medications prescribed. Participant is following exercise prescription and nutrition guidelines.;Long Term: Cholesterol controlled with medications as prescribed, with individualized exercise RX and with personalized nutrition plan. Value goals: LDL < 70mg , HDL > 40 mg.             Tobacco Use Initial Evaluation: Social History   Tobacco Use  Smoking Status Never  Smokeless Tobacco Former    Exercise Goals and Review:  Exercise Goals     Row Name 06/06/23 1325 06/13/23 1412           Exercise Goals   Increase Physical  Activity Yes Yes      Intervention Provide advice, education, support and counseling about physical activity/exercise needs.;Develop an individualized exercise prescription for aerobic and resistive training based on initial evaluation findings, risk stratification, comorbidities and participant's personal goals. Provide advice, education, support and counseling about physical activity/exercise needs.;Develop an individualized exercise prescription for aerobic and resistive training based on initial evaluation findings, risk stratification, comorbidities and participant's personal goals.      Expected Outcomes Short Term: Attend rehab on a regular basis to increase amount of physical activity.;Long Term: Add in home exercise to make exercise part of routine and to increase amount of physical activity.;Long Term: Exercising regularly at least 3-5 days a week. Short Term: Attend rehab on a regular basis to increase amount of physical activity.;Long Term: Add in home exercise to make exercise part of routine and to increase amount of physical activity.;Long Term: Exercising regularly at least 3-5 days a week.      Increase Strength and Stamina Yes Yes      Intervention Provide advice, education, support and counseling about physical activity/exercise needs.;Develop an individualized exercise prescription for aerobic and resistive training based on initial evaluation findings, risk stratification, comorbidities and participant's personal goals. Provide advice, education, support and counseling about physical activity/exercise needs.;Develop  an individualized exercise prescription for aerobic and resistive training based on initial evaluation findings, risk stratification, comorbidities and participant's personal goals.      Expected Outcomes Short Term: Increase workloads from initial exercise prescription for resistance, speed, and METs.;Short Term: Perform resistance training exercises routinely during rehab and  add in resistance training at home;Long Term: Improve cardiorespiratory fitness, muscular endurance and strength as measured by increased METs and functional capacity ( ) Short Term: Increase workloads from initial exercise prescription for resistance, speed, and METs.;Short Term: Perform resistance training exercises routinely during rehab and add in resistance training at home;Long Term: Improve cardiorespiratory fitness, muscular endurance and strength as measured by increased METs and functional capacity ( )      Able to understand and use rate of perceived exertion (RPE) scale Yes Yes      Intervention Provide education and explanation on how to use RPE scale Provide education and explanation on how to use RPE scale      Expected Outcomes Short Term: Able to use RPE daily in rehab to express subjective intensity level;Long Term:  Able to use RPE to guide intensity level when exercising independently Short Term: Able to use RPE daily in rehab to express subjective intensity level;Long Term:  Able to use RPE to guide intensity level when exercising independently      Able to understand and use Dyspnea scale Yes Yes      Intervention Provide education and explanation on how to use Dyspnea scale Provide education and explanation on how to use Dyspnea scale      Expected Outcomes Short Term: Able to use Dyspnea scale daily in rehab to express subjective sense of shortness of breath during exertion;Long Term: Able to use Dyspnea scale to guide intensity level when exercising independently Short Term: Able to use Dyspnea scale daily in rehab to express subjective sense of shortness of breath during exertion;Long Term: Able to use Dyspnea scale to guide intensity level when exercising independently      Knowledge and understanding of Target Heart Rate Range (THRR) Yes Yes      Intervention Provide education and explanation of THRR including how the numbers were predicted and where they are located for  reference Provide education and explanation of THRR including how the numbers were predicted and where they are located for reference      Expected Outcomes Short Term: Able to state/look up THRR;Short Term: Able to use daily as guideline for intensity in rehab;Long Term: Able to use THRR to govern intensity when exercising independently Short Term: Able to state/look up THRR;Short Term: Able to use daily as guideline for intensity in rehab;Long Term: Able to use THRR to govern intensity when exercising independently      Able to check pulse independently Yes Yes      Intervention Provide education and demonstration on how to check pulse in carotid and radial arteries.;Review the importance of being able to check your own pulse for safety during independent exercise Provide education and demonstration on how to check pulse in carotid and radial arteries.;Review the importance of being able to check your own pulse for safety during independent exercise      Expected Outcomes Short Term: Able to explain why pulse checking is important during independent exercise;Long Term: Able to check pulse independently and accurately Short Term: Able to explain why pulse checking is important during independent exercise;Long Term: Able to check pulse independently and accurately      Understanding of Exercise Prescription Yes Yes  Intervention Provide education, explanation, and written materials on patient's individual exercise prescription Provide education, explanation, and written materials on patient's individual exercise prescription      Expected Outcomes Short Term: Able to explain program exercise prescription;Long Term: Able to explain home exercise prescription to exercise independently Short Term: Able to explain program exercise prescription;Long Term: Able to explain home exercise prescription to exercise independently               Copy of goals given to participant.

## 2023-06-13 NOTE — Progress Notes (Signed)
 Cardiac Individual Treatment Plan  Patient Details  Name: Lee Adkins MRN: [id number] Date of Birth: [birth year] Referring Provider:   Flowsheet Row CARDIAC REHAB PHASE II ORIENTATION from 06/13/2023 in [facility] CARDIAC REHABILITATION  Referring Provider Gwynne Edinger MD       Initial Encounter Date:  Flowsheet Row CARDIAC REHAB PHASE II ORIENTATION from 06/13/2023 in [place] CARDIAC REHABILITATION  Date 06/13/23       Visit Diagnosis: S/P angioplasty with stent  Coronary artery disease without angina pectoris, unspecified vessel or lesion type, unspecified whether native or transplanted heart  Patient's Home Medications on Admission:  Current Outpatient Medications:    albuterol (VENTOLIN HFA) 108 (90 Base) MCG/ACT inhaler, Inhale 2 puffs into the lungs every 6 (six) hours as needed for wheezing or shortness of breath. (Patient not taking: Reported on 06/06/2023), Disp: 1 each, Rfl: 0   ASPIRIN LOW DOSE 81 MG tablet, Take 81 mg by mouth daily., Disp: , Rfl:    carvedilol (COREG) 25 MG tablet, Take 25 mg by mouth 2 (two) times daily., Disp: , Rfl:    carvedilol (COREG) 6.25 MG tablet, Take 6.25 mg by mouth 2 (two) times daily. (Patient not taking: Reported on 06/06/2023), Disp: , Rfl:    CONTOUR NEXT TEST test strip, 1 each as needed for other., Disp: , Rfl:    empagliflozin (JARDIANCE) 10 MG TABS tablet, Take 10 mg by mouth daily., Disp: , Rfl:    felodipine (PLENDIL) 10 MG 24 hr tablet, Take 10 mg by mouth daily., Disp: , Rfl:    felodipine (PLENDIL) 10 MG 24 hr tablet, Take 1 tablet by mouth daily., Disp: , Rfl:    insulin detemir (LEVEMIR) 100 UNIT/ML injection, Inject 0.3 mLs (30 Units total) into the skin at bedtime., Disp: 9 mL, Rfl: 0   JARDIANCE 25 MG TABS tablet, Take 25 mg by mouth daily., Disp: , Rfl:    losartan-hydrochlorothiazide (HYZAAR) 100-12.5 MG tablet, Take 1 tablet by mouth daily., Disp: , Rfl:    losartan-hydrochlorothiazide (HYZAAR) 100-25 MG tablet, Take  1 tablet by mouth daily., Disp: , Rfl:    metFORMIN (GLUCOPHAGE-XR) 500 MG 24 hr tablet, Take 1,500 mg by mouth daily with breakfast. (Patient not taking: Reported on 06/06/2023), Disp: , Rfl:    nitroGLYCERIN (NITROSTAT) 0.4 MG SL tablet, Place 0.4 mg under the tongue every 5 (five) minutes as needed., Disp: , Rfl:    prasugrel (EFFIENT) 10 MG TABS tablet, Take 10 mg by mouth daily., Disp: , Rfl:    REPATHA SURECLICK 140 MG/ML SOAJ, Inject 140 mg into the skin every 14 (fourteen) days., Disp: , Rfl:    TRULICITY 1.5 MG/0.5ML SOPN, Inject 1.5 mg into the skin once a week. 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   Diabetes mellitus without complication (HCC)    ORAL MEDICATION - NO INSULIN   History of kidney stones    Hypertension    Low testosterone    OSA on CPAP     Tobacco Use: Social History   Tobacco Use  Smoking Status Never  Smokeless Tobacco Former    Labs: Investment banker, operational       Latest Ref Rng & Units 02/24/2020  Labs for ITP Cardiac and Pulmonary Rehab  Hemoglobin A1c 4.8 - 5.6 % 11.2     Capillary Blood Glucose: Lab Results  Component Value Date   GLUCAP 280 (H) 02/26/2020   GLUCAP 331 (H) 02/26/2020   GLUCAP 53 (L) 02/26/2020   GLUCAP 138 (H) 02/25/2020   GLUCAP 138 (H) 02/25/2020     Exercise Target Goals: Exercise Program Goal: Individual exercise prescription set using results from initial 6 min walk test and THRR while considering  patient's activity barriers and safety.   Exercise Prescription Goal: Starting with aerobic activity 30 plus minutes a day, 3 days per week for initial exercise prescription. Provide home exercise prescription and guidelines that participant acknowledges understanding prior to discharge.  Activity Barriers & Risk Stratification:  Activity Barriers & Cardiac Risk Stratification -  06/13/23 1410       Activity Barriers & Cardiac Risk Stratification   Activity Barriers Joint Problems;Deconditioning;Balance Concerns;Left Knee Replacement;Muscular Weakness    Cardiac Risk Stratification Moderate             6 Minute Walk:  6 Minute Walk     Row Name 06/13/23 1408         6 Minute Walk   Phase Initial     Distance 1140 feet     Walk Time 6 minutes     # of Rest Breaks 0     MPH 2.16     METS 2.72     RPE 11     VO2 Peak 9.51     Symptoms Yes (comment)     Comments chronic back pain 6/10 (no knee pain)     Resting HR 58 bpm     Resting BP 120/64     Resting Oxygen Saturation  97 %     Exercise Oxygen Saturation  during 6 min walk 95 %     Max Ex. HR 113 bpm     Max Ex. BP 138/74     2 Minute Post BP 126/70              Oxygen Initial Assessment:   Oxygen Re-Evaluation:   Oxygen Discharge (Final Oxygen Re-Evaluation):   Initial Exercise Prescription:  Initial Exercise Prescription - 06/13/23 1400       Date of Initial Exercise RX and Referring Provider   Date 06/13/23    Referring Provider Gwynne Edinger MD      Oxygen   Maintain Oxygen Saturation 88% or higher      NuStep   Level 3    SPM 80    Minutes 15    METs 2.7      REL-XR   Level 2    Speed 50    Minutes 15    METs 2.7      Prescription Details   Frequency (times per week) 3    Duration Progress to 30 minutes of continuous aerobic without signs/symptoms of physical distress      Intensity   THRR 40-80% of Max Heartrate 97-136    Ratings of Perceived Exertion 11-13    Perceived Dyspnea 0-4      Progression   Progression Continue to progress workloads to maintain intensity without signs/symptoms of physical distress.      Resistance Training   Training Prescription Yes    Weight 4 lb    Reps 10-15             Perform Capillary Blood Glucose checks as needed.  Exercise Prescription Changes:   Exercise Prescription Changes     Row Name  06/13/23 1400  Response to Exercise   Blood Pressure (Admit) 120/64       Blood Pressure (Exercise) 138/74       Blood Pressure (Exit) 126/70       Heart Rate (Admit) 58 bpm       Heart Rate (Exercise) 113 bpm       Heart Rate (Exit) 63 bpm       Oxygen Saturation (Admit) 97 %       Oxygen Saturation (Exercise) 95 %       Rating of Perceived Exertion (Exercise) 11       Symptoms chronic back pain 6/10, no knee pain       Comments walk test results                Exercise Comments:   Exercise Goals and Review:   Exercise Goals     Row Name 06/06/23 1325 06/13/23 1412           Exercise Goals   Increase Physical Activity Yes Yes      Intervention Provide advice, education, support and counseling about physical activity/exercise needs.;Develop an individualized exercise prescription for aerobic and resistive training based on initial evaluation findings, risk stratification, comorbidities and participant's personal goals. Provide advice, education, support and counseling about physical activity/exercise needs.;Develop an individualized exercise prescription for aerobic and resistive training based on initial evaluation findings, risk stratification, comorbidities and participant's personal goals.      Expected Outcomes Short Term: Attend rehab on a regular basis to increase amount of physical activity.;Long Term: Add in home exercise to make exercise part of routine and to increase amount of physical activity.;Long Term: Exercising regularly at least 3-5 days a week. Short Term: Attend rehab on a regular basis to increase amount of physical activity.;Long Term: Add in home exercise to make exercise part of routine and to increase amount of physical activity.;Long Term: Exercising regularly at least 3-5 days a week.      Increase Strength and Stamina Yes Yes      Intervention Provide advice, education, support and counseling about physical activity/exercise  needs.;Develop an individualized exercise prescription for aerobic and resistive training based on initial evaluation findings, risk stratification, comorbidities and participant's personal goals. Provide advice, education, support and counseling about physical activity/exercise needs.;Develop an individualized exercise prescription for aerobic and resistive training based on initial evaluation findings, risk stratification, comorbidities and participant's personal goals.      Expected Outcomes Short Term: Increase workloads from initial exercise prescription for resistance, speed, and METs.;Short Term: Perform resistance training exercises routinely during rehab and add in resistance training at home;Long Term: Improve cardiorespiratory fitness, muscular endurance and strength as measured by increased METs and functional capacity ( ) Short Term: Increase workloads from initial exercise prescription for resistance, speed, and METs.;Short Term: Perform resistance training exercises routinely during rehab and add in resistance training at home;Long Term: Improve cardiorespiratory fitness, muscular endurance and strength as measured by increased METs and functional capacity ( )      Able to understand and use rate of perceived exertion (RPE) scale Yes Yes      Intervention Provide education and explanation on how to use RPE scale Provide education and explanation on how to use RPE scale      Expected Outcomes Short Term: Able to use RPE daily in rehab to express subjective intensity level;Long Term:  Able to use RPE to guide intensity level when exercising independently Short Term: Able to use RPE daily in rehab to express subjective  intensity level;Long Term:  Able to use RPE to guide intensity level when exercising independently      Able to understand and use Dyspnea scale Yes Yes      Intervention Provide education and explanation on how to use Dyspnea scale Provide education and explanation on how to  use Dyspnea scale      Expected Outcomes Short Term: Able to use Dyspnea scale daily in rehab to express subjective sense of shortness of breath during exertion;Long Term: Able to use Dyspnea scale to guide intensity level when exercising independently Short Term: Able to use Dyspnea scale daily in rehab to express subjective sense of shortness of breath during exertion;Long Term: Able to use Dyspnea scale to guide intensity level when exercising independently      Knowledge and understanding of Target Heart Rate Range (THRR) Yes Yes      Intervention Provide education and explanation of THRR including how the numbers were predicted and where they are located for reference Provide education and explanation of THRR including how the numbers were predicted and where they are located for reference      Expected Outcomes Short Term: Able to state/look up THRR;Short Term: Able to use daily as guideline for intensity in rehab;Long Term: Able to use THRR to govern intensity when exercising independently Short Term: Able to state/look up THRR;Short Term: Able to use daily as guideline for intensity in rehab;Long Term: Able to use THRR to govern intensity when exercising independently      Able to check pulse independently Yes Yes      Intervention Provide education and demonstration on how to check pulse in carotid and radial arteries.;Review the importance of being able to check your own pulse for safety during independent exercise Provide education and demonstration on how to check pulse in carotid and radial arteries.;Review the importance of being able to check your own pulse for safety during independent exercise      Expected Outcomes Short Term: Able to explain why pulse checking is important during independent exercise;Long Term: Able to check pulse independently and accurately Short Term: Able to explain why pulse checking is important during independent exercise;Long Term: Able to check pulse independently  and accurately      Understanding of Exercise Prescription Yes Yes      Intervention Provide education, explanation, and written materials on patient's individual exercise prescription Provide education, explanation, and written materials on patient's individual exercise prescription      Expected Outcomes Short Term: Able to explain program exercise prescription;Long Term: Able to explain home exercise prescription to exercise independently Short Term: Able to explain program exercise prescription;Long Term: Able to explain home exercise prescription to exercise independently               Exercise Goals Re-Evaluation :    Discharge Exercise Prescription (Final Exercise Prescription Changes):  Exercise Prescription Changes - 06/13/23 1400       Response to Exercise   Blood Pressure (Admit) 120/64    Blood Pressure (Exercise) 138/74    Blood Pressure (Exit) 126/70    Heart Rate (Admit) 58 bpm    Heart Rate (Exercise) 113 bpm    Heart Rate (Exit) 63 bpm    Oxygen Saturation (Admit) 97 %    Oxygen Saturation (Exercise) 95 %    Rating of Perceived Exertion (Exercise) 11    Symptoms chronic back pain 6/10, no knee pain    Comments walk test results  Nutrition:  Target Goals: Understanding of nutrition guidelines, daily intake of sodium 1500mg , cholesterol 200mg , calories 30% from fat and 7% or less from saturated fats, daily to have 5 or more servings of fruits and vegetables.  Biometrics:  Pre Biometrics - 06/13/23 1413       Pre Biometrics   Height 5' 7.5" (1.715 m)    Weight 99.4 kg    Waist Circumference 43 inches    Hip Circumference 41.5 inches    Waist to Hip Ratio 1.04 %    BMI (Calculated) 33.8    Grip Strength 28 kg    Single Leg Stand 7.5 seconds              Nutrition Therapy Plan and Nutrition Goals:  Nutrition Therapy & Goals - 06/06/23 1322       Intervention Plan   Intervention Prescribe, educate and counsel regarding  individualized specific dietary modifications aiming towards targeted core components such as weight, hypertension, lipid management, diabetes, heart failure and other comorbidities.;Nutrition handout(s) given to patient.    Expected Outcomes Short Term Goal: Understand basic principles of dietary content, such as calories, fat, sodium, cholesterol and nutrients.;Short Term Goal: A plan has been developed with personal nutrition goals set during dietitian appointment.;Long Term Goal: Adherence to prescribed nutrition plan.             Nutrition Assessments:  MEDIFICTS Score Key: >=70 Need to make dietary changes  40-70 Heart Healthy Diet <= 40 Therapeutic Level Cholesterol Diet  Flowsheet Row CARDIAC REHAB PHASE II ORIENTATION from 06/13/2023 in [facility] CARDIAC REHABILITATION  Picture Your Plate Total Score on Admission 71      Picture Your Plate Scores: <16 Unhealthy dietary pattern with much room for improvement. 41-50 Dietary pattern unlikely to meet recommendations for good health and room for improvement. 51-60 More healthful dietary pattern, with some room for improvement.  >60 Healthy dietary pattern, although there may be some specific behaviors that could be improved.    Nutrition Goals Re-Evaluation:   Nutrition Goals Discharge (Final Nutrition Goals Re-Evaluation):   Psychosocial: Target Goals: Acknowledge presence or absence of significant depression and/or stress, maximize coping skills, provide positive support system. Participant is able to verbalize types and ability to use techniques and skills needed for reducing stress and depression.  Initial Review & Psychosocial Screening:  Initial Psych Review & Screening - 06/06/23 1315       Initial Review   Current issues with None Identified      Family Dynamics   Comments Completed virtual orientation today.  EP evaluation is scheduled for 06/13/23 at 1:00 pm.  Documentation for diagnosis can be found in [facility]  encounter 05/31/23.      Barriers   Psychosocial barriers to participate in program There are no identifiable barriers or psychosocial needs.      Screening Interventions   Interventions Encouraged to exercise    Expected Outcomes Short Term goal: Utilizing psychosocial counselor, staff and physician to assist with identification of specific Stressors or current issues interfering with healing process. Setting desired goal for each stressor or current issue identified.;Long Term Goal: Stressors or current issues are controlled or eliminated.;Short Term goal: Identification and review with participant of any Quality of Life or Depression concerns found by scoring the questionnaire.;Long Term goal: The participant improves quality of Life and PHQ9 Scores as seen by post scores and/or verbalization of changes             Quality of Life Scores:  Scores of 19 and below usually indicate a poorer quality of life in these areas.  A difference of  2-3 points is a clinically meaningful difference.  A difference of 2-3 points in the total score of the Quality of Life Index has been associated with significant improvement in overall quality of life, self-image, physical symptoms, and general health in studies assessing change in quality of life.  PHQ-9: Review Flowsheet       06/13/2023  Depression screen PHQ 2/9  Decreased Interest 0  Down, Depressed, Hopeless 0  PHQ - 2 Score 0  Altered sleeping 0  Tired, decreased energy 1  Change in appetite 0  Feeling bad or failure about yourself  0  Trouble concentrating 0  Moving slowly or fidgety/restless 0  Suicidal thoughts 0  PHQ-9 Score 1  Difficult doing work/chores Not difficult at all   Interpretation of Total Score  Total Score Depression Severity:  1-4 = Minimal depression, 5-9 = Mild depression, 10-14 = Moderate depression, 15-19 = Moderately severe depression, 20-27 = Severe depression   Psychosocial Evaluation and Intervention:   Psychosocial Evaluation - 06/06/23 1318       Psychosocial Evaluation & Interventions   Interventions Stress management education;Relaxation education    Comments Elmore is a pleasant gentleman who is eager to come to cardiac rehab as he is dealing with CAD and a S/P PCI. He lives at home with his wife and 5 cats. 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 He currently doesn't do any home exercise, but is eager to start here in the program. He does have left knee issues due to a previous knee injury where he had surgery on it. He does not think he can do the treadmill due to his knee issues.    Expected Outcomes Short: Excited to start an exercise program. Long: Get his heart stronger.    Continue Psychosocial Services  Follow up required by staff             Psychosocial Re-Evaluation:   Psychosocial Discharge (Final Psychosocial Re-Evaluation):   Vocational Rehabilitation: Provide vocational rehab assistance to qualifying candidates.   Vocational Rehab Evaluation & Intervention:  Vocational Rehab - 06/06/23 1323       Initial Vocational Rehab Evaluation & Intervention   Assessment shows need for Vocational Rehabilitation No             Education: Education Goals: Education classes will be provided on a weekly basis, covering required topics. Participant will state understanding/return demonstration of topics presented.  Learning Barriers/Preferences:  Learning Barriers/Preferences - 06/06/23 1307       Learning Barriers/Preferences   Learning Barriers Sight   Wears glasses   Learning Preferences None             Education Topics: Hypertension, Hypertension Reduction -Define heart disease and high blood pressure. Discus how high blood pressure affects the body and ways to reduce high blood pressure.   Exercise and Your Heart -Discuss why it is important to exercise, the FITT principles of exercise,  normal and abnormal responses to exercise, and how to exercise safely.   Angina -Discuss definition of angina, causes of angina, treatment of angina, and how to decrease risk of having angina.   Cardiac Medications -Review what the following cardiac medications are used for, how they affect the body, and side effects that may occur when taking the medications.  Medications include Aspirin, Beta blockers, calcium channel  blockers, ACE Inhibitors, angiotensin receptor blockers, diuretics, digoxin, and antihyperlipidemics.   Congestive Heart Failure -Discuss the definition of CHF, how to live with CHF, the signs and symptoms of CHF, and how keep track of weight and sodium intake.   Heart Disease and Intimacy -Discus the effect sexual activity has on the heart, how changes occur during intimacy as we age, and safety during sexual activity.   Smoking Cessation / COPD -Discuss different methods to quit smoking, the health benefits of quitting smoking, and the definition of COPD.   Nutrition I: Fats -Discuss the types of cholesterol, what cholesterol does to the heart, and how cholesterol levels can be controlled.   Nutrition II: Labels -Discuss the different components of food labels and how to read food label   Heart Parts/Heart Disease and PAD -Discuss the anatomy of the heart, the pathway of blood circulation through the heart, and these are affected by heart disease.   Stress I: Signs and Symptoms -Discuss the causes of stress, how stress may lead to anxiety and depression, and ways to limit stress.   Stress II: Relaxation -Discuss different types of relaxation techniques to limit stress.   Warning Signs of Stroke / TIA -Discuss definition of a stroke, what the signs and symptoms are of a stroke, and how to identify when someone is having stroke.   Knowledge Questionnaire Score:   Core Components/Risk Factors/Patient Goals at Admission:  Personal Goals and Risk  Factors at Admission - 06/13/23 1414       Core Components/Risk Factors/Patient Goals on Admission    Weight Management Yes;Obesity;Weight Loss    Intervention Weight Management: Develop a combined nutrition and exercise program designed to reach desired caloric intake, while maintaining appropriate intake of nutrient and fiber, sodium and fats, and appropriate energy expenditure required for the weight goal.;Weight Management: Provide education and appropriate resources to help participant work on and attain dietary goals.;Weight Management/Obesity: Establish reasonable short term and long term weight goals.;Obesity: Provide education and appropriate resources to help participant work on and attain dietary goals.    Admit Weight 219 lb 3.2 oz (99.4 kg)    Goal Weight: Short Term 214 lb (97.1 kg)    Goal Weight: Long Term 209 lb (94.8 kg)    Expected Outcomes Short Term: Continue to assess and modify interventions until short term weight is achieved;Long Term: Adherence to nutrition and physical activity/exercise program aimed toward attainment of established weight goal;Weight Loss: Understanding of general recommendations for a balanced deficit meal plan, which promotes 1-2 lb weight loss per week and includes a negative energy balance of [id number] kcal/d;Understanding recommendations for meals to include 15-35% energy as protein, 25-35% energy from fat, 35-60% energy from carbohydrates, less than 200mg  of dietary cholesterol, 20-35 gm of total fiber daily;Understanding of distribution of calorie intake throughout the day with the consumption of 4-5 meals/snacks    Diabetes Yes    Intervention Provide education about signs/symptoms and action to take for hypo/hyperglycemia.;Provide education about proper nutrition, including hydration, and aerobic/resistive exercise prescription along with prescribed medications to achieve blood glucose in normal ranges: Fasting glucose 65-99 mg/dL    Expected Outcomes  Short Term: Participant verbalizes understanding of the signs/symptoms and immediate care of hyper/hypoglycemia, proper foot care and importance of medication, aerobic/resistive exercise and nutrition plan for blood glucose control.;Long Term: Attainment of HbA1C < 7%.    Hypertension Yes    Intervention Provide education on lifestyle modifcations including regular physical activity/exercise, weight management, moderate sodium restriction and increased  consumption of fresh fruit, vegetables, and low fat dairy, alcohol moderation, and smoking cessation.;Monitor prescription use compliance.    Expected Outcomes Short Term: Continued assessment and intervention until BP is < 140/68mm HG in hypertensive participants. < 130/35mm HG in hypertensive participants with diabetes, heart failure or chronic kidney disease.;Long Term: Maintenance of blood pressure at goal levels.    Lipids Yes    Intervention Provide education and support for participant on nutrition & aerobic/resistive exercise along with prescribed medications to achieve LDL 70mg , HDL >40mg .    Expected Outcomes Short Term: Participant states understanding of desired cholesterol values and is compliant with medications prescribed. Participant is following exercise prescription and nutrition guidelines.;Long Term: Cholesterol controlled with medications as prescribed, with individualized exercise RX and with personalized nutrition plan. Value goals: LDL < 70mg , HDL > 40 mg.             Core Components/Risk Factors/Patient Goals Review:    Core Components/Risk Factors/Patient Goals at Discharge (Final Review):    ITP Comments:  ITP Comments     Row Name 06/06/23 1300 06/13/23 1407         ITP Comments Completed virtual orientation today.  EP evaluation is scheduled for 06/13/23 at 1:00 pm.  Documentation for diagnosis can be found in [facility] encounter 05/31/23. Patient attend orientation today.  Patient is attending Cardiac Rehabilitation  Program.  Documentation for diagnosis can be found in Care Everywhere 05/31/23 office visit.  (Also scanned to patient chart in media)  Reviewed medical chart, RPE/RPD, gym safety, and program guidelines.  Patient was fitted to equipment they will be using during rehab.  Patient is scheduled to start exercise on Wednesday 06/14/23 at 745.   Initial ITP created and sent for review and signature by Dr. Dina Rich, Medical Director for Cardiac Rehabilitation Program.               Comments: Initial ITP

## 2023-06-14 ENCOUNTER — Encounter (HOSPITAL_COMMUNITY)
Admission: RE | Admit: 2023-06-14 | Discharge: 2023-06-14 | Disposition: A | Discharge status: Home / Self Care | Source: Ambulatory Visit | Attending: Cardiology | Admitting: Cardiology

## 2023-06-14 DIAGNOSIS — Z95820 Peripheral vascular angioplasty status with implants and grafts: Secondary | ICD-10-CM

## 2023-06-14 DIAGNOSIS — I2510 Atherosclerotic heart disease of native coronary artery without angina pectoris: Secondary | ICD-10-CM

## 2023-06-14 LAB — GLUCOSE, CAPILLARY
Glucose-Capillary: 318 mg/dL — ABNORMAL HIGH (ref 70–99)
Glucose-Capillary: 336 mg/dL — ABNORMAL HIGH (ref 70–99)

## 2023-06-14 NOTE — Progress Notes (Signed)
 Incomplete Session Note  Patient Details  Name: Lee Adkins MRN: [id number] Date of Birth: [birth year] Referring Provider:   Flowsheet Row CARDIAC REHAB PHASE II ORIENTATION from 06/13/2023 in [facility] CARDIAC REHABILITATION  Referring Provider Gwynne Edinger MD       Veryl Speak did not complete his rehab session.  His blood sugar was 318 this morning.  Encouraged water but he went up to 336.  He mentioned that his sugars are usually high in the morning.  He also ate breakfast that makes it go higher.  He is also concerned about his high copay and cost.  We are going to look into the patient funds to help.  He is going to try again on Tuesday and hoping with it being later in day it will help his sugar.

## 2023-06-16 ENCOUNTER — Telehealth (HOSPITAL_COMMUNITY): Payer: Self-pay | Admitting: Licensed Clinical Social Worker

## 2023-06-16 NOTE — Telephone Encounter (Signed)
 CSW contacted patient to follow up on financial concerns with cardiac rehab co pays. Patient reports he has [employer] and the per visit co pay is $40 and he needs to attend 2-3 x weekly. Patient does not qualify for assistance through Patient Assistance Fund and reports "most programs I am over the income limit". CSW suggested patient contact his insurance and inquire about options for co pays to be waived or other financial assistance through [place]. Patient verbalizes understanding and plans to follow up with [employer]. CSW available as needed. Lasandra Beech, LCSW, CCSW-MCS [phone]

## 2023-06-17 ENCOUNTER — Ambulatory Visit: Admit: 2023-06-17 | Discharge: 2023-06-18 | Payer: Medicare (Managed Care)

## 2023-06-17 ENCOUNTER — Inpatient Hospital Stay: Admit: 2023-06-17 | Discharge: 2023-06-18 | Payer: Medicare (Managed Care)

## 2023-06-17 DIAGNOSIS — S4991XA Unspecified injury of right shoulder and upper arm, initial encounter: Principal | ICD-10-CM

## 2023-06-17 DIAGNOSIS — W19XXXA Unspecified fall, initial encounter: Principal | ICD-10-CM

## 2023-06-17 NOTE — Unmapped (Signed)
 Name:  George Moore  DOB: 12-20-1955  Date: 06/17/2023    ASSESSMENT/PLAN:  George Moore was seen today for fall.    Diagnoses and all orders for this visit:    Injury of right shoulder, initial encounter  -     XR Shoulder 3 Or More Views Right    Fall, initial encounter      George Moore is a 68 y.o. male presents evaluated after falling down 3 days ago.  Patient states he was walking down 4 wooden steps when he tripped and fell forward.  His hands out to catch himself and rolled onto his right side.  Started with right shoulder pain and right side pain last night.  On exam, lungs are clear to auscultation bilaterally.  Patient has a small bruise on his right anterior chest wall with minimal tenderness.  No pain with inspiration.  No tenderness to palpation of right shoulder.  Patient has pain with most movements of right shoulder though.  X-ray of right shoulder is negative for fracture or other acute bony abnormality.  Suspect shoulder strain.  Recommend applying ice to area several times daily.  May continue OTC medications as needed for pain.  Recommend avoiding lifting, pushing, pulling more than 5 pounds for the next week.  Recommend follow-up with orthopedics if pain persist.  ------------------------------------------------------------------------------    Chief Complaint   Patient presents with    George Moore down wood stairs out of a tractor trailor onto concrete 3d ago. Rolled onto R shoulder, no head contact.  Last pm R shoulder became more painful all the way down arm.  Bruising on R side and some low back pain worsening.        HPI: George Moore is a 68 y.o. male presents to be evaluated after falling down 3 days ago. Patient states he was walking down 4 wooden steps when he tripped  and fell forward. Put his hands out to catch himself and rolled onto his right side. Did not have pain the first 2 days but started with pain in his right shoulder and right side last night. Noticed some bruising on his right rib cage this morning. Is on a blood thinner. Also complains of back pain. Took some excedrin last night. No pain with inspiration    ROS:  Review of systems as above.  Rest of review of systems negative unless otherwise noted as per HPI.    I have reviewed past medical, surgical, medications, allergies, social and family histories today and updated them in Epic where appropriate.    PMH:  Past Medical History:   Diagnosis Date    Acute hypoxic respiratory failure 02/22/2020    Arthritis     Calculus of kidney     Diabetes mellitus     Type II    Encounter for long-term (current) use of other medications     Hives     Hypertension     Hypertrophy of prostate with urinary obstruction and other lower urinary tract symptoms (LUTS)     Hypokalemia     Hyponatremia     OSA on CPAP     Other testicular hypofunction     Pneumonia due to COVID-19 virus 02/20/2020    Renal colic     Umbilical hernia          MEDS:    Current Outpatient Medications:     aspirin  (ECOTRIN) 81 MG tablet, Take 1 tablet (81 mg total)  by mouth daily., Disp: 30 tablet, Rfl: 11    carvedilol  (COREG ) 25 MG tablet, Take 1 tablet (25 mg total) by mouth in the morning and 1 tablet (25 mg total) in the evening. Take with meals., Disp: 180 tablet, Rfl: 3    CONTOUR NEXT TEST STRIPS Strp, , Disp: , Rfl:     empty container (SHARPS-A-GATOR DISPOSAL SYSTEM) Misc, Use as directed for sharps disposal, Disp: 1 each, Rfl: 2    evolocumab  (REPATHA  SURECLICK) 140 mg/mL PnIj, Inject the contents of one pen (140 mg) under the skin every fourteen (14) days., Disp: 2 mL, Rfl: 11    felodipine  (PLENDIL ) 10 MG 24 hr tablet, Take 1 tablet (10 mg total) by mouth daily., Disp: 90 tablet, Rfl: 3    JARDIANCE 10 mg tablet, Take 1 tablet (10 mg total) by mouth daily., Disp: , Rfl:     losartan -hydroCHLOROthiazide  (HYZAAR ) 100-12.5 mg per tablet, Take 1 tablet by mouth daily., Disp: 90 tablet, Rfl: 3    nitroglycerin  (NITROSTAT ) 0.4 MG SL tablet, Place 1 tablet (0.4 mg total) under the tongue every five (5) minutes as needed for chest pain. Maximum of 3 doses in 15 minutes., Disp: 25 tablet, Rfl: 6    prasugrel  (EFFIENT ) 10 mg tablet, Take 1 tablet (10 mg total) by mouth daily., Disp: 30 tablet, Rfl: 11    ALL:  Allergies   Allergen Reactions    Statins-Hmg-Coa Reductase Inhibitors Muscle Pain    Amoxicillin Rash    Penicillin Rash       SH:  Social History     Tobacco Use    Smoking status: Never     Passive exposure: Past    Smokeless tobacco: Former   Haematologist status: Never Used   Substance Use Topics    Alcohol use: Yes     Comment: drinks once a blue moon    Drug use: No         VITALS:  Vitals:    06/17/23 1428   BP: 130/66   Pulse: 59   Resp: 12   Temp: 36.9 ??C (98.5 ??F)   SpO2: 95%     Body mass index is 33.6 kg/m??.    Physical Exam  Constitutional:       Appearance: Normal appearance.   Cardiovascular:      Rate and Rhythm: Normal rate and regular rhythm.      Heart sounds: Normal heart sounds.   Pulmonary:      Effort: Pulmonary effort is normal.      Breath sounds: Normal breath sounds.   Chest:      Chest wall: Tenderness present.       Musculoskeletal:      Right shoulder: No swelling or tenderness. Normal range of motion.      Comments: TTP of right shoulder.  Full range of motion.  Pain with most movements   Neurological:      Mental Status: He is alert.         TEST  RESULTS:    No results found for this visit on 06/17/23.    SCREENINGS:      Follow-up as Needed                 Maxine Spare, [place]  [facility] [place]  ----------------------------------------------------------------  Note - This record has been created using [employer]. Chart creation errors have been sought, but may not always have been located.  Such creation errors do not reflect on the standard of medical care.

## 2023-06-17 NOTE — Unmapped (Signed)
 No fracture or other acute bony abnormality noted on x-ray. Wet read by me in clinic. Will follow up with radiology report if different from initial read    Apply ice to area several times daily    May continue OTC pain medications as needed    Recommend avoiding lifting, pushing, pulling more than 5 lbs with right arm for 1 week. If pain persists, recommend follow up with Othopedics

## 2023-06-20 ENCOUNTER — Encounter (HOSPITAL_COMMUNITY)

## 2023-06-21 ENCOUNTER — Encounter (HOSPITAL_COMMUNITY)

## 2023-06-27 ENCOUNTER — Encounter (HOSPITAL_COMMUNITY)

## 2023-06-28 ENCOUNTER — Encounter (HOSPITAL_COMMUNITY)

## 2023-06-28 ENCOUNTER — Encounter (HOSPITAL_COMMUNITY): Payer: Self-pay | Admitting: *Deleted

## 2023-06-28 DIAGNOSIS — Z95820 Peripheral vascular angioplasty status with implants and grafts: Secondary | ICD-10-CM

## 2023-06-28 NOTE — Progress Notes (Signed)
 Cardiac Individual Treatment Plan  Patient Details  Name: Lee Adkins MRN: [id number] Date of Birth: [birth year] Referring Provider:   Flowsheet Row CARDIAC REHAB PHASE II ORIENTATION from 06/13/2023 in [facility] CARDIAC REHABILITATION  Referring Provider Gwyndolyn Lerner MD       Initial Encounter Date:  Flowsheet Row CARDIAC REHAB PHASE II ORIENTATION from 06/13/2023 in [place] CARDIAC REHABILITATION  Date 06/13/23       Visit Diagnosis: S/P angioplasty with stent  Patient's Home Medications on Admission:  Current Outpatient Medications:    albuterol  (VENTOLIN  HFA) 108 (90 Base) MCG/ACT inhaler, Inhale 2 puffs into the lungs every 6 (six) hours as needed for wheezing or shortness of breath. (Patient not taking: Reported on 06/06/2023), Disp: 1 each, Rfl: 0   ASPIRIN LOW DOSE 81 MG tablet, Take 81 mg by mouth daily., Disp: , Rfl:    carvedilol  (COREG ) 25 MG tablet, Take 25 mg by mouth 2 (two) times daily., Disp: , Rfl:    carvedilol  (COREG ) 6.25 MG tablet, Take 6.25 mg by mouth 2 (two) times daily. (Patient not taking: Reported on 06/06/2023), Disp: , Rfl:    CONTOUR NEXT TEST test strip, 1 each as needed for other., Disp: , Rfl:    empagliflozin (JARDIANCE) 10 MG TABS tablet, Take 10 mg by mouth daily., Disp: , Rfl:    felodipine  (PLENDIL ) 10 MG 24 hr tablet, Take 10 mg by mouth daily., Disp: , Rfl:    felodipine  (PLENDIL ) 10 MG 24 hr tablet, Take 1 tablet by mouth daily., Disp: , Rfl:    insulin  detemir (LEVEMIR ) 100 UNIT/ML injection, Inject 0.3 mLs (30 Units total) into the skin at bedtime., Disp: 9 mL, Rfl: 0   JARDIANCE 25 MG TABS tablet, Take 25 mg by mouth daily., Disp: , Rfl:    losartan -hydrochlorothiazide  (HYZAAR) 100-12.5 MG tablet, Take 1 tablet by mouth daily., Disp: , Rfl:    losartan -hydrochlorothiazide  (HYZAAR) 100-25 MG tablet, Take 1 tablet by mouth daily., Disp: , Rfl:    metFORMIN  (GLUCOPHAGE -XR) 500 MG 24 hr tablet, Take 1,500 mg by mouth daily with breakfast.  (Patient not taking: Reported on 06/06/2023), Disp: , Rfl:    nitroGLYCERIN (NITROSTAT) 0.4 MG SL tablet, Place 0.4 mg under the tongue every 5 (five) minutes as needed., Disp: , Rfl:    prasugrel (EFFIENT) 10 MG TABS tablet, Take 10 mg by mouth daily., Disp: , Rfl:    REPATHA SURECLICK 140 MG/ML SOAJ, Inject 140 mg into the skin every 14 (fourteen) days., Disp: , Rfl:    TRULICITY 1.5 MG/0.5ML SOPN, Inject 1.5 mg into the skin once a week. 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   Diabetes mellitus without complication (HCC)    ORAL MEDICATION - NO INSULIN    History of kidney stones    Hypertension    Low testosterone    OSA on CPAP     Tobacco Use: Social History   Tobacco Use  Smoking Status Never  Smokeless Tobacco Former    Labs: Investment banker, operational       Latest Ref Rng & Units 02/24/2020  Labs for ITP Cardiac and Pulmonary Rehab  Hemoglobin A1c 4.8 - 5.6 % 11.2     Capillary Blood Glucose: Lab Results  Component Value Date   GLUCAP 336 (H) 06/14/2023   GLUCAP 318 (H) 06/14/2023  GLUCAP 280 (H) 02/26/2020   GLUCAP 331 (H) 02/26/2020   GLUCAP 53 (L) 02/26/2020     Exercise Target Goals: Exercise Program Goal: Individual exercise prescription set using results from initial 6 min walk test and THRR while considering  patient's activity barriers and safety.   Exercise Prescription Goal: Starting with aerobic activity 30 plus minutes a day, 3 days per week for initial exercise prescription. Provide home exercise prescription and guidelines that participant acknowledges understanding prior to discharge.  Activity Barriers & Risk Stratification:  Activity Barriers & Cardiac Risk Stratification - 06/13/23 1410       Activity Barriers & Cardiac Risk Stratification   Activity Barriers Joint Problems;Deconditioning;Balance  Concerns;Left Knee Replacement;Muscular Weakness    Cardiac Risk Stratification Moderate             6 Minute Walk:  6 Minute Walk     Row Name 06/13/23 1408         6 Minute Walk   Phase Initial     Distance 1140 feet     Walk Time 6 minutes     # of Rest Breaks 0     MPH 2.16     METS 2.72     RPE 11     VO2 Peak 9.51     Symptoms Yes (comment)     Comments chronic back pain 6/10 (no knee pain)     Resting HR 58 bpm     Resting BP 120/64     Resting Oxygen Saturation  97 %     Exercise Oxygen Saturation  during 6 min walk 95 %     Max Ex. HR 113 bpm     Max Ex. BP 138/74     2 Minute Post BP 126/70              Oxygen Initial Assessment:   Oxygen Re-Evaluation:   Oxygen Discharge (Final Oxygen Re-Evaluation):   Initial Exercise Prescription:  Initial Exercise Prescription - 06/13/23 1400       Date of Initial Exercise RX and Referring Provider   Date 06/13/23    Referring Provider Gwyndolyn Lerner MD      Oxygen   Maintain Oxygen Saturation 88% or higher      NuStep   Level 3    SPM 80    Minutes 15    METs 2.7      REL-XR   Level 2    Speed 50    Minutes 15    METs 2.7      Prescription Details   Frequency (times per week) 3    Duration Progress to 30 minutes of continuous aerobic without signs/symptoms of physical distress      Intensity   THRR 40-80% of Max Heartrate 97-136    Ratings of Perceived Exertion 11-13    Perceived Dyspnea 0-4      Progression   Progression Continue to progress workloads to maintain intensity without signs/symptoms of physical distress.      Resistance Training   Training Prescription Yes    Weight 4 lb    Reps 10-15             Perform Capillary Blood Glucose checks as needed.  Exercise Prescription Changes:   Exercise Prescription Changes     Row Name 06/13/23 1400             Response to Exercise   Blood Pressure (Admit) 120/64       Blood Pressure (  Exercise) 138/74        Blood Pressure (Exit) 126/70       Heart Rate (Admit) 58 bpm       Heart Rate (Exercise) 113 bpm       Heart Rate (Exit) 63 bpm       Oxygen Saturation (Admit) 97 %       Oxygen Saturation (Exercise) 95 %       Rating of Perceived Exertion (Exercise) 11       Symptoms chronic back pain 6/10, no knee pain       Comments walk test results                Exercise Comments:   Exercise Goals and Review:   Exercise Goals     Row Name 06/06/23 1325 06/13/23 1412           Exercise Goals   Increase Physical Activity Yes Yes      Intervention Provide advice, education, support and counseling about physical activity/exercise needs.;Develop an individualized exercise prescription for aerobic and resistive training based on initial evaluation findings, risk stratification, comorbidities and participant's personal goals. Provide advice, education, support and counseling about physical activity/exercise needs.;Develop an individualized exercise prescription for aerobic and resistive training based on initial evaluation findings, risk stratification, comorbidities and participant's personal goals.      Expected Outcomes Short Term: Attend rehab on a regular basis to increase amount of physical activity.;Long Term: Add in home exercise to make exercise part of routine and to increase amount of physical activity.;Long Term: Exercising regularly at least 3-5 days a week. Short Term: Attend rehab on a regular basis to increase amount of physical activity.;Long Term: Add in home exercise to make exercise part of routine and to increase amount of physical activity.;Long Term: Exercising regularly at least 3-5 days a week.      Increase Strength and Stamina Yes Yes      Intervention Provide advice, education, support and counseling about physical activity/exercise needs.;Develop an individualized exercise prescription for aerobic and resistive training based on initial evaluation findings, risk  stratification, comorbidities and participant's personal goals. Provide advice, education, support and counseling about physical activity/exercise needs.;Develop an individualized exercise prescription for aerobic and resistive training based on initial evaluation findings, risk stratification, comorbidities and participant's personal goals.      Expected Outcomes Short Term: Increase workloads from initial exercise prescription for resistance, speed, and METs.;Short Term: Perform resistance training exercises routinely during rehab and add in resistance training at home;Long Term: Improve cardiorespiratory fitness, muscular endurance and strength as measured by increased METs and functional capacity ( ) Short Term: Increase workloads from initial exercise prescription for resistance, speed, and METs.;Short Term: Perform resistance training exercises routinely during rehab and add in resistance training at home;Long Term: Improve cardiorespiratory fitness, muscular endurance and strength as measured by increased METs and functional capacity ( )      Able to understand and use rate of perceived exertion (RPE) scale Yes Yes      Intervention Provide education and explanation on how to use RPE scale Provide education and explanation on how to use RPE scale      Expected Outcomes Short Term: Able to use RPE daily in rehab to express subjective intensity level;Long Term:  Able to use RPE to guide intensity level when exercising independently Short Term: Able to use RPE daily in rehab to express subjective intensity level;Long Term:  Able to use RPE to guide intensity level when exercising independently  Able to understand and use Dyspnea scale Yes Yes      Intervention Provide education and explanation on how to use Dyspnea scale Provide education and explanation on how to use Dyspnea scale      Expected Outcomes Short Term: Able to use Dyspnea scale daily in rehab to express subjective sense of  shortness of breath during exertion;Long Term: Able to use Dyspnea scale to guide intensity level when exercising independently Short Term: Able to use Dyspnea scale daily in rehab to express subjective sense of shortness of breath during exertion;Long Term: Able to use Dyspnea scale to guide intensity level when exercising independently      Knowledge and understanding of Target Heart Rate Range (THRR) Yes Yes      Intervention Provide education and explanation of THRR including how the numbers were predicted and where they are located for reference Provide education and explanation of THRR including how the numbers were predicted and where they are located for reference      Expected Outcomes Short Term: Able to state/look up THRR;Short Term: Able to use daily as guideline for intensity in rehab;Long Term: Able to use THRR to govern intensity when exercising independently Short Term: Able to state/look up THRR;Short Term: Able to use daily as guideline for intensity in rehab;Long Term: Able to use THRR to govern intensity when exercising independently      Able to check pulse independently Yes Yes      Intervention Provide education and demonstration on how to check pulse in carotid and radial arteries.;Review the importance of being able to check your own pulse for safety during independent exercise Provide education and demonstration on how to check pulse in carotid and radial arteries.;Review the importance of being able to check your own pulse for safety during independent exercise      Expected Outcomes Short Term: Able to explain why pulse checking is important during independent exercise;Long Term: Able to check pulse independently and accurately Short Term: Able to explain why pulse checking is important during independent exercise;Long Term: Able to check pulse independently and accurately      Understanding of Exercise Prescription Yes Yes      Intervention Provide education, explanation, and  written materials on patient's individual exercise prescription Provide education, explanation, and written materials on patient's individual exercise prescription      Expected Outcomes Short Term: Able to explain program exercise prescription;Long Term: Able to explain home exercise prescription to exercise independently Short Term: Able to explain program exercise prescription;Long Term: Able to explain home exercise prescription to exercise independently               Exercise Goals Re-Evaluation :    Discharge Exercise Prescription (Final Exercise Prescription Changes):  Exercise Prescription Changes - 06/13/23 1400       Response to Exercise   Blood Pressure (Admit) 120/64    Blood Pressure (Exercise) 138/74    Blood Pressure (Exit) 126/70    Heart Rate (Admit) 58 bpm    Heart Rate (Exercise) 113 bpm    Heart Rate (Exit) 63 bpm    Oxygen Saturation (Admit) 97 %    Oxygen Saturation (Exercise) 95 %    Rating of Perceived Exertion (Exercise) 11    Symptoms chronic back pain 6/10, no knee pain    Comments walk test results             Nutrition:  Target Goals: Understanding of nutrition guidelines, daily intake of sodium 1500mg , cholesterol 200mg ,  calories 30% from fat and 7% or less from saturated fats, daily to have 5 or more servings of fruits and vegetables.  Biometrics:  Pre Biometrics - 06/13/23 1413       Pre Biometrics   Height 5' 7.5" (1.715 m)    Weight 219 lb 3.2 oz (99.4 kg)    Waist Circumference 43 inches    Hip Circumference 41.5 inches    Waist to Hip Ratio 1.04 %    BMI (Calculated) 33.8    Grip Strength 28 kg    Single Leg Stand 7.5 seconds              Nutrition Therapy Plan and Nutrition Goals:  Nutrition Therapy & Goals - 06/06/23 1322       Intervention Plan   Intervention Prescribe, educate and counsel regarding individualized specific dietary modifications aiming towards targeted core components such as weight,  hypertension, lipid management, diabetes, heart failure and other comorbidities.;Nutrition handout(s) given to patient.    Expected Outcomes Short Term Goal: Understand basic principles of dietary content, such as calories, fat, sodium, cholesterol and nutrients.;Short Term Goal: A plan has been developed with personal nutrition goals set during dietitian appointment.;Long Term Goal: Adherence to prescribed nutrition plan.             Nutrition Assessments:  MEDIFICTS Score Key: >=70 Need to make dietary changes  40-70 Heart Healthy Diet <= 40 Therapeutic Level Cholesterol Diet  Flowsheet Row CARDIAC REHAB PHASE II ORIENTATION from 06/13/2023 in [facility] CARDIAC REHABILITATION  Picture Your Plate Total Score on Admission 71      Picture Your Plate Scores: <16 Unhealthy dietary pattern with much room for improvement. 41-50 Dietary pattern unlikely to meet recommendations for good health and room for improvement. 51-60 More healthful dietary pattern, with some room for improvement.  >60 Healthy dietary pattern, although there may be some specific behaviors that could be improved.    Nutrition Goals Re-Evaluation:   Nutrition Goals Discharge (Final Nutrition Goals Re-Evaluation):   Psychosocial: Target Goals: Acknowledge presence or absence of significant depression and/or stress, maximize coping skills, provide positive support system. Participant is able to verbalize types and ability to use techniques and skills needed for reducing stress and depression.  Initial Review & Psychosocial Screening:  Initial Psych Review & Screening - 06/06/23 1315       Initial Review   Current issues with None Identified      Family Dynamics   Comments Completed virtual orientation today.  EP evaluation is scheduled for 06/13/23 at 1:00 pm.  Documentation for diagnosis can be found in [facility] encounter 05/31/23.      Barriers   Psychosocial barriers to participate in program There are no  identifiable barriers or psychosocial needs.      Screening Interventions   Interventions Encouraged to exercise    Expected Outcomes Short Term goal: Utilizing psychosocial counselor, staff and physician to assist with identification of specific Stressors or current issues interfering with healing process. Setting desired goal for each stressor or current issue identified.;Long Term Goal: Stressors or current issues are controlled or eliminated.;Short Term goal: Identification and review with participant of any Quality of Life or Depression concerns found by scoring the questionnaire.;Long Term goal: The participant improves quality of Life and PHQ9 Scores as seen by post scores and/or verbalization of changes             Quality of Life Scores:  Quality of Life - 06/14/23 1096  Quality of Life   Select Quality of Life      Quality of Life Scores   Health/Function Pre 17.92 %    Socioeconomic Pre 21.25 %    Psych/Spiritual Pre 22 %    Family Pre 19.5 %    GLOBAL Pre 19.52 %            Scores of 19 and below usually indicate a poorer quality of life in these areas.  A difference of  2-3 points is a clinically meaningful difference.  A difference of 2-3 points in the total score of the Quality of Life Index has been associated with significant improvement in overall quality of life, self-image, physical symptoms, and general health in studies assessing change in quality of life.  PHQ-9: Review Flowsheet       06/13/2023  Depression screen PHQ 2/9  Decreased Interest 0  Down, Depressed, Hopeless 0  PHQ - 2 Score 0  Altered sleeping 0  Tired, decreased energy 1  Change in appetite 0  Feeling bad or failure about yourself  0  Trouble concentrating 0  Moving slowly or fidgety/restless 0  Suicidal thoughts 0  PHQ-9 Score 1  Difficult doing work/chores Not difficult at all   Interpretation of Total Score  Total Score Depression Severity:  1-4 = Minimal depression,  5-9 = Mild depression, 10-14 = Moderate depression, 15-19 = Moderately severe depression, 20-27 = Severe depression   Psychosocial Evaluation and Intervention:  Psychosocial Evaluation - 06/06/23 1318       Psychosocial Evaluation & Interventions   Interventions Stress management education;Relaxation education    Comments Lee Adkins is a pleasant gentleman who is eager to come to cardiac rehab as he is dealing with CAD and a S/P PCI. He lives at home with his wife and 5 cats. 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 He currently doesn't do any home exercise, but is eager to start here in the program. He does have left knee issues due to a previous knee injury where he had surgery on it. He does not think he can do the treadmill due to his knee issues.    Expected Outcomes Short: Excited to start an exercise program. Long: Get his heart stronger.    Continue Psychosocial Services  Follow up required by staff             Psychosocial Re-Evaluation:   Psychosocial Discharge (Final Psychosocial Re-Evaluation):   Vocational Rehabilitation: Provide vocational rehab assistance to qualifying candidates.   Vocational Rehab Evaluation & Intervention:  Vocational Rehab - 06/06/23 1323       Initial Vocational Rehab Evaluation & Intervention   Assessment shows need for Vocational Rehabilitation No             Education: Education Goals: Education classes will be provided on a weekly basis, covering required topics. Participant will state understanding/return demonstration of topics presented.  Learning Barriers/Preferences:  Learning Barriers/Preferences - 06/06/23 1307       Learning Barriers/Preferences   Learning Barriers Sight   Wears glasses   Learning Preferences None             Education Topics: Hypertension, Hypertension Reduction -Define heart disease and high blood pressure. Discus how high blood pressure  affects the body and ways to reduce high blood pressure.   Exercise and Your Heart -Discuss why it is important to exercise, the FITT principles of exercise, normal and abnormal responses to  exercise, and how to exercise safely.   Angina -Discuss definition of angina, causes of angina, treatment of angina, and how to decrease risk of having angina.   Cardiac Medications -Review what the following cardiac medications are used for, how they affect the body, and side effects that may occur when taking the medications.  Medications include Aspirin, Beta blockers, calcium channel blockers, ACE Inhibitors, angiotensin receptor blockers, diuretics, digoxin, and antihyperlipidemics.   Congestive Heart Failure -Discuss the definition of CHF, how to live with CHF, the signs and symptoms of CHF, and how keep track of weight and sodium intake.   Heart Disease and Intimacy -Discus the effect sexual activity has on the heart, how changes occur during intimacy as we age, and safety during sexual activity.   Smoking Cessation / COPD -Discuss different methods to quit smoking, the health benefits of quitting smoking, and the definition of COPD.   Nutrition I: Fats -Discuss the types of cholesterol, what cholesterol does to the heart, and how cholesterol levels can be controlled.   Nutrition II: Labels -Discuss the different components of food labels and how to read food label   Heart Parts/Heart Disease and PAD -Discuss the anatomy of the heart, the pathway of blood circulation through the heart, and these are affected by heart disease.   Stress I: Signs and Symptoms -Discuss the causes of stress, how stress may lead to anxiety and depression, and ways to limit stress.   Stress II: Relaxation -Discuss different types of relaxation techniques to limit stress.   Warning Signs of Stroke / TIA -Discuss definition of a stroke, what the signs and symptoms are of a stroke, and how to identify  when someone is having stroke.   Knowledge Questionnaire Score:  Knowledge Questionnaire Score - 06/14/23 0828       Knowledge Questionnaire Score   Pre Score 18/24             Core Components/Risk Factors/Patient Goals at Admission:  Personal Goals and Risk Factors at Admission - 06/13/23 1414       Core Components/Risk Factors/Patient Goals on Admission    Weight Management Yes;Obesity;Weight Loss    Intervention Weight Management: Develop a combined nutrition and exercise program designed to reach desired caloric intake, while maintaining appropriate intake of nutrient and fiber, sodium and fats, and appropriate energy expenditure required for the weight goal.;Weight Management: Provide education and appropriate resources to help participant work on and attain dietary goals.;Weight Management/Obesity: Establish reasonable short term and long term weight goals.;Obesity: Provide education and appropriate resources to help participant work on and attain dietary goals.    Admit Weight 219 lb 3.2 oz (99.4 kg)    Goal Weight: Short Term 214 lb (97.1 kg)    Goal Weight: Long Term 209 lb (94.8 kg)    Expected Outcomes Short Term: Continue to assess and modify interventions until short term weight is achieved;Long Term: Adherence to nutrition and physical activity/exercise program aimed toward attainment of established weight goal;Weight Loss: Understanding of general recommendations for a balanced deficit meal plan, which promotes 1-2 lb weight loss per week and includes a negative energy balance of [phone] kcal/d;Understanding recommendations for meals to include 15-35% energy as protein, 25-35% energy from fat, 35-60% energy from carbohydrates, less than 200mg  of dietary cholesterol, 20-35 gm of total fiber daily;Understanding of distribution of calorie intake throughout the day with the consumption of 4-5 meals/snacks    Diabetes Yes    Intervention Provide education about signs/symptoms  and action  to take for hypo/hyperglycemia.;Provide education about proper nutrition, including hydration, and aerobic/resistive exercise prescription along with prescribed medications to achieve blood glucose in normal ranges: Fasting glucose 65-99 mg/dL    Expected Outcomes Short Term: Participant verbalizes understanding of the signs/symptoms and immediate care of hyper/hypoglycemia, proper foot care and importance of medication, aerobic/resistive exercise and nutrition plan for blood glucose control.;Long Term: Attainment of HbA1C < 7%.    Hypertension Yes    Intervention Provide education on lifestyle modifcations including regular physical activity/exercise, weight management, moderate sodium restriction and increased consumption of fresh fruit, vegetables, and low fat dairy, alcohol moderation, and smoking cessation.;Monitor prescription use compliance.    Expected Outcomes Short Term: Continued assessment and intervention until BP is < 140/6mm HG in hypertensive participants. < 130/65mm HG in hypertensive participants with diabetes, heart failure or chronic kidney disease.;Long Term: Maintenance of blood pressure at goal levels.    Lipids Yes    Intervention Provide education and support for participant on nutrition & aerobic/resistive exercise along with prescribed medications to achieve LDL 70mg , HDL >40mg .    Expected Outcomes Short Term: Participant states understanding of desired cholesterol values and is compliant with medications prescribed. Participant is following exercise prescription and nutrition guidelines.;Long Term: Cholesterol controlled with medications as prescribed, with individualized exercise RX and with personalized nutrition plan. Value goals: LDL < 70mg , HDL > 40 mg.             Core Components/Risk Factors/Patient Goals Review:   Goals and Risk Factor Review     Row Name 06/14/23 0823             Core Components/Risk Factors/Patient Goals Review   Personal  Goals Review Diabetes       Review Lee Adkins did not complete his rehab session.  His blood sugar was 318 this morning.  Encouraged water but he went up to 336.  He mentioned that his sugars are usually high in the morning.  He also ate breakfast that makes it go higher.  He is also concerned about his high copay and cost.  We are going to look into the patient funds to help.  He is going to try again on Tuesday and hoping with it being later in day it will help his sugar. He is also not the biggest fan of his medication.       Expected Outcomes Improve blood sugar control in morning                Core Components/Risk Factors/Patient Goals at Discharge (Final Review):   Goals and Risk Factor Review - 06/14/23 0823       Core Components/Risk Factors/Patient Goals Review   Personal Goals Review Diabetes    Review Lee Adkins did not complete his rehab session.  His blood sugar was 318 this morning.  Encouraged water but he went up to 336.  He mentioned that his sugars are usually high in the morning.  He also ate breakfast that makes it go higher.  He is also concerned about his high copay and cost.  We are going to look into the patient funds to help.  He is going to try again on Tuesday and hoping with it being later in day it will help his sugar. He is also not the biggest fan of his medication.    Expected Outcomes Improve blood sugar control in morning             ITP Comments:  ITP  Comments     Row Name 06/06/23 1300 06/13/23 1407 06/14/23 0823 06/28/23 0832     ITP Comments Completed virtual orientation today.  EP evaluation is scheduled for 06/13/23 at 1:00 pm.  Documentation for diagnosis can be found in [facility] encounter 05/31/23. Patient attend orientation today.  Patient is attending Cardiac Rehabilitation Program.  Documentation for diagnosis can be found in Care Everywhere 05/31/23 office visit.  (Also scanned to patient chart in media)  Reviewed medical chart,  RPE/RPD, gym safety, and program guidelines.  Patient was fitted to equipment they will be using during rehab.  Patient is scheduled to start exercise on Wednesday 06/14/23 at 745.   Initial ITP created and sent for review and signature by Dr. Armida Lander, Medical Director for Cardiac Rehabilitation Program. Lee Adkins did not complete his rehab session.  His blood sugar was 318 this morning.  Encouraged water but he went up to 336.  He mentioned that his sugars are usually high in the morning.  He also ate breakfast that makes it go higher.  He is also concerned about his high copay and cost.  We are going to look into the patient funds to help.  He is going to try again on Tuesday and hoping with it being later in day it will help his sugar. Athena Bland called to let us  know that he will not be able to continue in program due to his copay and high morning sugars.  We had reached out to patient care for finanacial assistance, unfortunately he did not qualify.  We will discharge him at this time.  He only completed orientation.             Comments: Discharge ITP (only completed orientation due to copay and blood sugars)

## 2023-06-28 NOTE — Progress Notes (Signed)
 Discharge Progress Report  Patient Details  Name: Lee Adkins MRN: [id number] Date of Birth: 1955/11/02 Referring Provider:   Flowsheet Row CARDIAC REHAB PHASE II ORIENTATION from 06/13/2023 in [facility] CARDIAC REHABILITATION  Referring Provider Gwyndolyn Lerner MD        Number of Visits: 1  Reason for Discharge:  Early Exit:  Insurance and high copays and high morning blood sugars  Smoking History:  Social History   Tobacco Use  Smoking Status Never  Smokeless Tobacco Former    Diagnosis:  S/P angioplasty with stent    Initial Exercise Prescription:  Initial Exercise Prescription - 06/13/23 1400       Date of Initial Exercise RX and Referring Provider   Date 06/13/23    Referring Provider Gwyndolyn Lerner MD      Oxygen   Maintain Oxygen Saturation 88% or higher      NuStep   Level 3    SPM 80    Minutes 15    METs 2.7      REL-XR   Level 2    Speed 50    Minutes 15    METs 2.7      Prescription Details   Frequency (times per week) 3    Duration Progress to 30 minutes of continuous aerobic without signs/symptoms of physical distress      Intensity   THRR 40-80% of Max Heartrate 97-136    Ratings of Perceived Exertion 11-13    Perceived Dyspnea 0-4      Progression   Progression Continue to progress workloads to maintain intensity without signs/symptoms of physical distress.      Resistance Training   Training Prescription Yes    Weight 4 lb    Reps 10-15             Discharge Exercise Prescription (Final Exercise Prescription Changes):  Exercise Prescription Changes - 06/13/23 1400       Response to Exercise   Blood Pressure (Admit) 120/64    Blood Pressure (Exercise) 138/74    Blood Pressure (Exit) 126/70    Heart Rate (Admit) 58 bpm    Heart Rate (Exercise) 113 bpm    Heart Rate (Exit) 63 bpm    Oxygen Saturation (Admit) 97 %    Oxygen Saturation (Exercise) 95 %    Rating of Perceived Exertion (Exercise) 11    Symptoms  chronic back pain 6/10, no knee pain    Comments walk test results             Functional Capacity:  6 Minute Walk     Row Name 06/13/23 1408         6 Minute Walk   Phase Initial     Distance 1140 feet     Walk Time 6 minutes     # of Rest Breaks 0     MPH 2.16     METS 2.72     RPE 11     VO2 Peak 9.51     Symptoms Yes (comment)     Comments chronic back pain 6/10 (no knee pain)     Resting HR 58 bpm     Resting BP 120/64     Resting Oxygen Saturation  97 %     Exercise Oxygen Saturation  during 6 min walk 95 %     Max Ex. HR 113 bpm     Max Ex. BP 138/74     2 Minute Post BP 126/70  Psychological, QOL, Others - Outcomes: PHQ 2/9:    06/13/2023   12:38 PM  Depression screen PHQ 2/9  Decreased Interest 0  Down, Depressed, Hopeless 0  PHQ - 2 Score 0  Altered sleeping 0  Tired, decreased energy 1  Change in appetite 0  Feeling bad or failure about yourself  0  Trouble concentrating 0  Moving slowly or fidgety/restless 0  Suicidal thoughts 0  PHQ-9 Score 1  Difficult doing work/chores Not difficult at all    Quality of Life:  Quality of Life - 06/14/23 0829       Quality of Life   Select Quality of Life      Quality of Life Scores   Health/Function Pre 17.92 %    Socioeconomic Pre 21.25 %    Psych/Spiritual Pre 22 %    Family Pre 19.5 %    GLOBAL Pre 19.52 %              Nutrition & Weight - Outcomes:  Pre Biometrics - 06/13/23 1413       Pre Biometrics   Height 5' 7.5" (1.715 m)    Weight 219 lb 3.2 oz (99.4 kg)    Waist Circumference 43 inches    Hip Circumference 41.5 inches    Waist to Hip Ratio 1.04 %    BMI (Calculated) 33.8    Grip Strength 28 kg    Single Leg Stand 7.5 seconds

## 2023-06-29 ENCOUNTER — Encounter (HOSPITAL_COMMUNITY)

## 2023-06-30 MED FILL — REPATHA SURECLICK 140 MG/ML SUBCUTANEOUS PEN INJECTOR: SUBCUTANEOUS | 28 days supply | Qty: 2 | Fill #10

## 2023-07-03 NOTE — Unmapped (Signed)
 [facility] does not have Felodipine 10 mg in stock, wants us  to order 5 mg two tablets daily.  Verbal order given.

## 2023-07-04 ENCOUNTER — Encounter (HOSPITAL_COMMUNITY)

## 2023-07-05 ENCOUNTER — Encounter (HOSPITAL_COMMUNITY)

## 2023-07-06 ENCOUNTER — Encounter (HOSPITAL_COMMUNITY)

## 2023-07-11 ENCOUNTER — Encounter (HOSPITAL_COMMUNITY)

## 2023-07-12 ENCOUNTER — Encounter (HOSPITAL_COMMUNITY)

## 2023-07-13 ENCOUNTER — Encounter (HOSPITAL_COMMUNITY)

## 2023-07-18 ENCOUNTER — Encounter (HOSPITAL_COMMUNITY)

## 2023-07-19 ENCOUNTER — Encounter (HOSPITAL_COMMUNITY)

## 2023-07-20 ENCOUNTER — Encounter (HOSPITAL_COMMUNITY)

## 2023-07-25 ENCOUNTER — Encounter (HOSPITAL_COMMUNITY)

## 2023-07-25 MED FILL — REPATHA SURECLICK 140 MG/ML SUBCUTANEOUS PEN INJECTOR: SUBCUTANEOUS | 28 days supply | Qty: 2 | Fill #11

## 2023-07-26 ENCOUNTER — Encounter (HOSPITAL_COMMUNITY)

## 2023-07-27 ENCOUNTER — Encounter (HOSPITAL_COMMUNITY)

## 2023-08-01 ENCOUNTER — Encounter (HOSPITAL_COMMUNITY)

## 2023-08-02 ENCOUNTER — Encounter (HOSPITAL_COMMUNITY)

## 2023-08-03 ENCOUNTER — Encounter (HOSPITAL_COMMUNITY)

## 2023-08-08 ENCOUNTER — Encounter (HOSPITAL_COMMUNITY)

## 2023-08-09 ENCOUNTER — Encounter (HOSPITAL_COMMUNITY)

## 2023-08-10 ENCOUNTER — Encounter (HOSPITAL_COMMUNITY)

## 2023-08-11 NOTE — Unmapped (Signed)
 [place] [facility]  NO OUTREACH    George Moore is a 67 y.o. male identified as possibly having proportion of days covered of less than 80%  for a diabetes medication , based on payer reports and chart review.    Population:  [place]ET[place]    After chart and pharmacy claims review regarding the medication(s) below, patient outreach was not needed due to the following reason(s):         empagliflozin (Jardiance) 25mg : Recently filled at [facility] ([place] on 08/07/2023 for 90 days supply                Kimball Pence , CPhT  Certified Pharmacy Technician  [place] [facility] ([facility])  Phone: [phone]

## 2023-08-15 ENCOUNTER — Encounter (HOSPITAL_COMMUNITY)

## 2023-08-16 ENCOUNTER — Encounter (HOSPITAL_COMMUNITY)

## 2023-08-17 ENCOUNTER — Encounter (HOSPITAL_COMMUNITY)

## 2023-08-22 ENCOUNTER — Encounter (HOSPITAL_COMMUNITY)

## 2023-08-22 DIAGNOSIS — E785 Hyperlipidemia, unspecified: Principal | ICD-10-CM

## 2023-08-22 DIAGNOSIS — Z9189 Other specified personal risk factors, not elsewhere classified: Principal | ICD-10-CM

## 2023-08-22 MED ORDER — REPATHA SURECLICK 140 MG/ML SUBCUTANEOUS PEN INJECTOR
SUBCUTANEOUS | 11 refills | 28.00000 days
Start: 2023-08-22 — End: ?

## 2023-08-23 ENCOUNTER — Encounter (HOSPITAL_COMMUNITY)

## 2023-08-24 ENCOUNTER — Encounter (HOSPITAL_COMMUNITY)

## 2023-08-24 MED ORDER — REPATHA SURECLICK 140 MG/ML SUBCUTANEOUS PEN INJECTOR
SUBCUTANEOUS | 11 refills | 28.00000 days | Status: CP
Start: 2023-08-24 — End: ?
  Filled 2023-08-24: qty 6, 84d supply, fill #0

## 2023-08-29 ENCOUNTER — Encounter (HOSPITAL_COMMUNITY)

## 2023-08-30 ENCOUNTER — Encounter (HOSPITAL_COMMUNITY)

## 2023-08-31 ENCOUNTER — Encounter (HOSPITAL_COMMUNITY)

## 2023-09-05 ENCOUNTER — Encounter (HOSPITAL_COMMUNITY)

## 2023-09-06 ENCOUNTER — Encounter (HOSPITAL_COMMUNITY)

## 2023-09-07 ENCOUNTER — Encounter (HOSPITAL_COMMUNITY)

## 2023-09-12 ENCOUNTER — Encounter (HOSPITAL_COMMUNITY)

## 2023-09-12 MED ORDER — LOSARTAN 100 MG-HYDROCHLOROTHIAZIDE 12.5 MG TABLET
ORAL_TABLET | Freq: Every day | ORAL | 3 refills | 90.00000 days | Status: CP
Start: 2023-09-12 — End: ?

## 2023-09-13 ENCOUNTER — Encounter
Admit: 2023-09-13 | Discharge: 2023-09-13 | Payer: Medicare (Managed Care) | Attending: Hematology & Oncology | Primary: Hematology & Oncology

## 2023-09-13 ENCOUNTER — Encounter (HOSPITAL_COMMUNITY)

## 2023-09-13 DIAGNOSIS — Z125 Encounter for screening for malignant neoplasm of prostate: Principal | ICD-10-CM

## 2023-09-13 DIAGNOSIS — Z130 Encounter for screening for diseases of the blood and blood-forming organs and certain disorders involving the immune mechanism: Principal | ICD-10-CM

## 2023-09-13 DIAGNOSIS — L02232 Carbuncle of back [any part, except buttock]: Principal | ICD-10-CM

## 2023-09-13 DIAGNOSIS — Z0000 Encounter for general adult medical examination without abnormal findings: Principal | ICD-10-CM

## 2023-09-13 DIAGNOSIS — E1165 Type 2 diabetes mellitus with hyperglycemia: Principal | ICD-10-CM

## 2023-09-13 DIAGNOSIS — I10 Essential (primary) hypertension: Principal | ICD-10-CM

## 2023-09-13 DIAGNOSIS — L989 Disorder of the skin and subcutaneous tissue, unspecified: Principal | ICD-10-CM

## 2023-09-13 DIAGNOSIS — Z1329 Encounter for screening for other suspected endocrine disorder: Principal | ICD-10-CM

## 2023-09-13 DIAGNOSIS — E119 Type 2 diabetes mellitus without complications: Principal | ICD-10-CM

## 2023-09-13 DIAGNOSIS — E7800 Pure hypercholesterolemia, unspecified: Principal | ICD-10-CM

## 2023-09-13 DIAGNOSIS — Z0100 Encounter for examination of eyes and vision without abnormal findings: Principal | ICD-10-CM

## 2023-09-13 LAB — LIPID PANEL
CHOLESTEROL: 101 mg/dL (ref <200)
HDL CHOLESTEROL: 40 mg/dL — ABNORMAL LOW (ref >40)
LDL CHOLESTEROL CALCULATED: 34 mg/dL (ref <100)
NON-HDL CHOLESTEROL: 61 mg/dL (ref <130)
TRIGLYCERIDES: 205 mg/dL — ABNORMAL HIGH (ref <150)

## 2023-09-13 LAB — COMPREHENSIVE METABOLIC PANEL
ALBUMIN: 4.2 g/dL (ref 3.4–5.0)
ALKALINE PHOSPHATASE: 75 U/L (ref 46–116)
ALT (SGPT): 26 U/L (ref 10–49)
ANION GAP: 10 mmol/L (ref 5–14)
AST (SGOT): 18 U/L (ref <=34)
BILIRUBIN TOTAL: 1.9 mg/dL — ABNORMAL HIGH (ref 0.3–1.2)
BLOOD UREA NITROGEN: 26 mg/dL — ABNORMAL HIGH (ref 9–23)
BUN / CREAT RATIO: 25
CALCIUM: 10.6 mg/dL — ABNORMAL HIGH (ref 8.7–10.4)
CHLORIDE: 102 mmol/L (ref 98–107)
CO2: 27 mmol/L (ref 20.0–31.0)
CREATININE: 1.06 mg/dL (ref 0.73–1.18)
EGFR CKD-EPI (2021) MALE: 78 mL/min/1.73m2 (ref >=60)
GLUCOSE RANDOM: 231 mg/dL — ABNORMAL HIGH (ref 70–179)
POTASSIUM: 4 mmol/L (ref 3.5–5.1)
PROTEIN TOTAL: 7.6 g/dL (ref 5.7–8.2)
SODIUM: 139 mmol/L (ref 135–145)

## 2023-09-13 LAB — CBC
HEMATOCRIT: 42.6 % (ref 39.0–48.0)
HEMOGLOBIN: 14.9 g/dL (ref 12.9–16.5)
MEAN CORPUSCULAR HEMOGLOBIN CONC: 35.1 g/dL (ref 32.0–36.0)
MEAN CORPUSCULAR HEMOGLOBIN: 29.3 pg (ref 25.9–32.4)
MEAN CORPUSCULAR VOLUME: 83.6 fL (ref 77.6–95.7)
MEAN PLATELET VOLUME: 10 fL (ref 6.8–10.7)
PLATELET COUNT: 145 10*9/L — ABNORMAL LOW (ref 150–450)
RED BLOOD CELL COUNT: 5.09 10*12/L (ref 4.26–5.60)
RED CELL DISTRIBUTION WIDTH: 13.1 % (ref 12.2–15.2)
WBC ADJUSTED: 7.4 10*9/L (ref 3.6–11.2)

## 2023-09-13 LAB — TSH: THYROID STIMULATING HORMONE: 1.351 u[IU]/mL (ref 0.550–4.780)

## 2023-09-13 LAB — ALBUMIN / CREATININE URINE RATIO
ALBUMIN QUANT URINE: 0.6 mg/dL
ALBUMIN/CREATININE RATIO: 10.2 ug/mg (ref 0.0–30.0)
CREATININE, URINE: 58.9 mg/dL

## 2023-09-13 LAB — PSA: PROSTATE SPECIFIC ANTIGEN: 8.68 ng/mL — ABNORMAL HIGH (ref 0.00–4.00)

## 2023-09-13 MED ORDER — DOXYCYCLINE HYCLATE 100 MG CAPSULE
ORAL_CAPSULE | Freq: Two times a day (BID) | ORAL | 0 refills | 7.00000 days | Status: CP
Start: 2023-09-13 — End: 2023-09-20

## 2023-09-13 MED ORDER — METFORMIN 500 MG TABLET
ORAL_TABLET | Freq: Two times a day (BID) | ORAL | 1 refills | 30.00000 days | Status: CP
Start: 2023-09-13 — End: 2024-10-17

## 2023-09-13 NOTE — Unmapped (Signed)
 [facility] Note:  George Moore, [name]. Phone [phone]    09/13/2023    Patient Name:   George Moore    MRN: [id number]    Demographics:    Age-  68 y.o.     Date of Birth-  07-30-1955    Chief complaint (CC):    Chief Complaint   Patient presents with    Establish Care     Patient     Follow-up     Patient states he has a sore spot on his back and wants labs done for his diabetes       Assessment/Plan:    Pleasant 68 y.o. male with:    Assessment & Plan  Type 2 Diabetes Mellitus  Poor glycemic control with A1c of 9.3%. Restarting metformin  is necessary to improve control and prevent complications.  - Restart metformin  500 mg twice daily.  - Continue Jardiance 10 mg daily.  - Order hemoglobin A1c every three months.  - Provide dietary counseling to reduce carbohydrate intake, emphasizing avoidance of white foods.  - Encourage regular exercise.    Chronic Skin Lesion on back  Chronic lesion on back, concerning for squamous cell carcinoma. Referral to dermatology needed for evaluation and possible excision.  - Prescribe doxycycline  100 mg twice daily for seven days.  - Refer to a dermatologist for evaluation and possible excision.    CAD:  Coronary artery disease with two stents. Continue current antiplatelet therapy.  - Continue aspirin  and prasugrel  as prescribed.  - Plan to discontinue prasugrel  after one year of therapy.  - Patient has follow up with Cardiology in 3 months.    Hypertension    Hyperlipidemia  Good LDL control with Repatha . Consider discontinuation based on future lipid levels.    General Health Maintenance  Due for retinal eye exam and PSA test.  - Refer to [facility] for retinal eye exam.    Follow-up  Requires follow-up for diabetes management and dermatological evaluation.  - Schedule follow-up appointment in three months for diabetes management.  - Provide referral information for dermatology follow-up.     Diagnosis ICD-10-CM Associated Orders   1. Encounter for medical examination to establish care  Z00.00       2. Essential hypertension  I10       3. Type 2 diabetes mellitus with hyperglycemia, without long-term current use of insulin      E11.65 POCT glycosylated hemoglobin (Hb A1C)     Albumin/creatinine urine ratio     metFORMIN  (GLUCOPHAGE ) 500 MG tablet      4. Pure hypercholesterolemia  E78.00 Lipid Panel     Comprehensive Metabolic Panel      5. Screening PSA (prostate specific antigen)  Z12.5 PSA (Prostate Specific Antigen)      6. Thyroid disorder screen  Z13.29 TSH      7. Screening for deficiency anemia  Z13.0 CBC      8. Carbuncle of back  L02.232 doxycycline  (VIBRAMYCIN ) 100 MG capsule      9. Skin lesion of back  L98.9 Ambulatory referral to Dermatology      10. Diabetic eye exam     Z01.00 Ambulatory referral to Ophthalmology    E11.9           I personally spent 45 minutes face-to-face and non-face-to-face in the care of this patient, which includes all pre, intra, and post visit time on the date of service.  All documented time was specific to  the E/M visit and does not include any procedures that may have been performed. We discussed medical, dietary, lifestyle, and health maintenance modifications to optimize health. Standard precautions followed during visit. Medication adherence and barriers to the treatment plan have been addressed.  Patient voiced understanding, needs F/U visit 45 minutes.     Health Maintenance:   Health Maintenance Due   Topic Date Due    Foot Exam  Never done    Retinal Eye Exam  Never done    Urine Albumin/Creatinine Ratio  Never done    Hepatitis C Screen  Never done    Zoster Vaccines (1 of 2) Never done    Pneumococcal Vaccine 50+ (2 of 2 - PCV) 07/09/2020    Hemoglobin A1c  01/23/2022    COVID-19 Vaccine (1 - 2024-25 season) Never done       Subjective:    History of present illness (HPI):      History of Present Illness  George Moore is a 68 year old male who presents to establish care.    He has a history of type 2 diabetes and is currently taking Jardiance 10 mg daily. His last hemoglobin A1c was checked over a year ago. He uses natural remedies in addition to his medication. He acknowledges that his diet and exercise habits are not optimal, which may be affecting his blood sugar control.    He has hypertension and is taking losartan  HCTZ 100/12.5 mg daily, carvedilol  25 mg twice a day, and felodipine . His blood pressure is currently well-controlled with this regimen.    He has a history of coronary artery disease and has undergone two stent placements after blockages were found during a CT scan. He is currently taking aspirin  and prasugrel  (Effient ) as part of his treatment regimen. He is scheduled to see his cardiologist in September. No chest pain today and has never had a heart attack, as his coronary issues were addressed before any cardiac event occurred.    He has hyperlipidemia and is currently on Repatha . His LDL levels were previously high but have since improved.    He reports a chronic skin lesion on his back, present for over two years, which sometimes itches and bleeds. He does not have a dermatologist and has not had a recent evaluation for this lesion. His sister has had skin cancers on her arm and has very frail skin.    He reports minimal alcohol consumption, with a shot every three months or a beer occasionally, and denies smoking. He is in the process of moving from [place] to [place].    Denies HA, fever, chest pain, shortness of breath, N/V/D, bowel or bladder issues, vision or hearing changes, or swelling.     Relevant ROS: Reviewed 12 systems, positive findings listed, all others negative.    Pertinent Past Med Hx:  Past Medical History[1]    Medications:     Current Medications[2]     Allergies:   Allergies[3]    Pertinent Social Hx and Habits: EMR reviewed    Pertinent Family Hx: EMR reviewed    Objective:      BP Readings from Last 3 Encounters:   06/17/23 130/66   05/31/23 127/59 03/06/23 117/58        There were no vitals filed for this visit.     Physical Exam:    Physical Exam  SKIN: Skin lesion on back, 2 cm x 1 cm raised carbuncle.    General Appearance: WDWN in NAD  Skin: W, D, I right upper back skin lesion which is red crusted and eryhematous with irregular borders concerning for skin cancer. 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  HEENT: PERRLA, EOMI, TM's clear  Respiratory: Clear throughout  Cardio: RRR  Abdomen: Soft and non-tnder  Neurologic: A & O X 4, Grossly intact, stable gait  PSYCH: Behavior calm and cooperative    Diagnostics:     Results  LABS  A1c: 9.3% (09/13/2023)    RADIOLOGY  CT scan: Obstruction noted (10/2022)    DIAGNOSTIC  EKG: Abnormal findings (10/2022)    PATHOLOGY  Skin lesion biopsy: 2 cm x 1 cm raised carbuncle, chronic    Lab Results   Component Value Date    WBC 8.4 02/13/2023    HGB 15.1 02/13/2023    HCT 43.7 02/13/2023    PLT 144 (L) 02/13/2023       Lab Results   Component Value Date    NA 142 02/13/2023    K 4.0 02/13/2023    CL 100 02/13/2023    CO2 29.3 02/13/2023    BUN 15 02/13/2023    CREATININE 0.99 02/13/2023    GLU 227 (H) 02/13/2023    CALCIUM 10.1 02/13/2023       Lab Results   Component Value Date    BILITOT 3.3 (H) 06/10/2022    BILIDIR 0.40 (H) 06/09/2022    PROT 6.9 06/10/2022    ALBUMIN 3.7 06/10/2022    ALT 34 06/10/2022    AST 14 06/10/2022    ALKPHOS 79 06/10/2022       Lab Results   Component Value Date    PT 11.2 06/09/2022    INR 1.01 06/09/2022    APTT 27.1 06/09/2022        No results found for: LIPID, TSH, A1CPCT       Slater MARLA Sole, MD              [1]   Past Medical History:  Diagnosis Date    Acute hypoxic respiratory failure    02/22/2020    Arthritis     Calculus of kidney     Diabetes mellitus        Type II    Encounter for long-term (current) use of other medications     Hives     Hypertension     Hypertrophy of prostate with urinary obstruction and other lower urinary tract symptoms (LUTS)     Hypokalemia Hyponatremia     OSA on CPAP     Other testicular hypofunction     Pneumonia due to COVID-19 virus 02/20/2020    Renal colic     Umbilical hernia    [2]   Current Outpatient Medications:     aspirin  (ECOTRIN) 81 MG tablet, Take 1 tablet (81 mg total) by mouth daily., Disp: 30 tablet, Rfl: 11    carvedilol  (COREG ) 25 MG tablet, Take 1 tablet (25 mg total) by mouth in the morning and 1 tablet (25 mg total) in the evening. Take with meals., Disp: 180 tablet, Rfl: 3    CONTOUR NEXT TEST STRIPS Strp, , Disp: , Rfl:     empty container (SHARPS-A-GATOR DISPOSAL SYSTEM) Misc, Use as directed for sharps disposal, Disp: 1 each, Rfl: 2    evolocumab  (REPATHA  SURECLICK) 140 mg/mL PnIj, Inject the contents of one pen (140 mg) under the skin every fourteen (14) days., Disp: 2 mL, Rfl: 11    JARDIANCE 10 mg tablet, Take  1 tablet (10 mg total) by mouth daily., Disp: , Rfl:     losartan -hydroCHLOROthiazide  (HYZAAR ) 100-12.5 mg per tablet, TAKE 1 TABLET BY MOUTH ONCE DAILY., Disp: 90 tablet, Rfl: 3    nitroglycerin  (NITROSTAT ) 0.4 MG SL tablet, Place 1 tablet (0.4 mg total) under the tongue every five (5) minutes as needed for chest pain. Maximum of 3 doses in 15 minutes., Disp: 25 tablet, Rfl: 6    prasugrel  (EFFIENT ) 10 mg tablet, Take 1 tablet (10 mg total) by mouth daily., Disp: 30 tablet, Rfl: 11  [3]   Allergies  Allergen Reactions    Statins-Hmg-Coa Reductase Inhibitors Muscle Pain    Amoxicillin Rash    Penicillin Rash

## 2023-09-14 ENCOUNTER — Encounter (HOSPITAL_COMMUNITY)

## 2023-09-19 ENCOUNTER — Encounter (HOSPITAL_COMMUNITY)

## 2023-09-19 DIAGNOSIS — R972 Elevated prostate specific antigen [PSA]: Principal | ICD-10-CM

## 2023-09-19 NOTE — Unmapped (Signed)
 George Moore,  Lipids showed borderline high triglycerides you are on repatha  injection but would also lower fats in diet by decreasing red meat, cheese, butter, margarine, fast food and alcohol.Chemistries show slight increase in total bilirubin at 1.9 however this is overall improved from 1 year ago at which time this was 3.3 and will continue to monitor. Your PSA was elevated at 8.68 and referral is placed to Urologist for evaluation. CBC shows mild thrombocytopenia at 145,000 and this is not new and was present 1 year ago and will need to be monitored and if decreases I will need to refer you to a hematologist for evaluation. Please let me know if you have any abnormal bleeding or bruising given that you are taking both effient  and aspirin  at this time and platelets are slightly low. TSH was normal. If you have any further concerns about your labs please make follow up appt.  Dr. Alyse

## 2023-09-20 ENCOUNTER — Encounter (HOSPITAL_COMMUNITY)

## 2023-09-21 ENCOUNTER — Encounter (HOSPITAL_COMMUNITY)

## 2023-09-26 ENCOUNTER — Encounter (HOSPITAL_COMMUNITY)

## 2023-09-27 ENCOUNTER — Encounter (HOSPITAL_COMMUNITY)

## 2023-09-28 ENCOUNTER — Encounter (HOSPITAL_COMMUNITY)

## 2023-10-03 ENCOUNTER — Encounter (HOSPITAL_COMMUNITY)

## 2023-10-03 MED ORDER — FELODIPINE ER 10 MG TABLET,EXTENDED RELEASE 24 HR
ORAL_TABLET | Freq: Every day | ORAL | 0 refills | 0.00000 days
Start: 2023-10-03 — End: ?

## 2023-10-04 ENCOUNTER — Encounter (HOSPITAL_COMMUNITY)

## 2023-10-05 ENCOUNTER — Encounter (HOSPITAL_COMMUNITY)

## 2023-10-05 MED ORDER — FELODIPINE ER 10 MG TABLET,EXTENDED RELEASE 24 HR
ORAL_TABLET | Freq: Every day | ORAL | 1 refills | 90.00000 days | Status: CP
Start: 2023-10-05 — End: ?

## 2023-10-10 ENCOUNTER — Encounter (HOSPITAL_COMMUNITY)

## 2023-10-11 ENCOUNTER — Encounter (HOSPITAL_COMMUNITY)

## 2023-10-12 ENCOUNTER — Encounter (HOSPITAL_COMMUNITY)

## 2023-11-07 MED ORDER — PRASUGREL HCL 10 MG TABLET
ORAL_TABLET | Freq: Every day | ORAL | 10 refills | 30.00000 days | Status: CP
Start: 2023-11-07 — End: ?

## 2023-11-08 DIAGNOSIS — D492 Neoplasm of unspecified behavior of bone, soft tissue, and skin: Principal | ICD-10-CM

## 2023-11-08 NOTE — Unmapped (Signed)
 Dermatology Note     Assessment and Plan:      Neoplasm(ia) of unspecified etiology:  - To help confirm diagnosis, biopsy/biopsies obtained today.   Biopsy (Shave) Procedure Note:   After R/B/A discussed (including scarring, pigment alteration, recurrence, or persistence of the lesion) and consent was obtained, the area was marked and photographed. Time Out verification of patient, procedure, and site was performed. When applicable, safety precautions based on patient's medical history or medication use and review of relevant images and results was also performed as part of the Time Out. Site was then prepped with alcohol, and anesthetized with lidocaine  2% with epinephrine. Biopsy(ies) performed using a shave technique. Hemostasis was achieved with pressure, aluminum chloride, Monsel's, and/or electrocautery. Area was dressed with petrolatum and bandage. Wound care instructions were provided. We will contact the patient with results when available. Patient agrees to be notified of results by MyChart - even if needs further management.   A) Location: upper back, DDx: BCC vs SCC vs AK vs KA vs SK, favor BCC    The patient was advised to call for an appointment should any new, changing, or symptomatic lesions develop.     RTC: Return if symptoms worsen or fail to improve, for pending biopsy results. or sooner as needed   _________________________________________________________________      Chief Complaint     Chief Complaint   Patient presents with    Lesion Of Concern     Lesion of concern on back for 2-3 years. Pt states lesion is sore to touch, itchy, and bleeds frequently. Pt states lesion has grown larger. Pt uses neosporin.        HPI     George Moore is a 68 y.o. male who presents as a new patient to Dermatology for a lesion of concern.     Today:   - patient notes he has had a growing lesion on his upper back that is starting to bleed and itch  - His PCP has noticed it and decided to refer to dermatology  - no other hx of skin cancer    The patient denies any other new or changing lesions or areas of concern.     Pertinent Past Medical History     No history of skin cancer    Problem List    None    Family History:   Negative for melanoma    Past Medical History, Family History, Social History, Medication List, Allergies, and Problem List were reviewed in the rooming section of Epic.     ROS: Other than symptoms mentioned in the HPI, no fevers, chills, or other skin complaints    Physical Examination     GENERAL: Well-appearing male in no acute distress, resting comfortably.  NEURO: Alert and oriented, answers questions appropriately  PSYCH: Normal mood and affect  RESP: No increased work of breathing  SKIN (Focal Skin Exam): Per patient request, examination of upper back was performed            Number of Biopsies: 1  Site (Biopsy A): Back (Comment: upper right)  Differential (Biopsy A): Neoplasia - Uncertain etiology  Neoplasia (Biopsy A): BCC vs. SCC vs. AK vs. KA vs. SK  Size of Lesion (Biopsy A): 2 cm  Triangulation (Biopsy A): see photo  Type of biopsy (Biopsy A): Shave  Description (Biopsy A): large ulcerated erythematous pink nodule with arborizing telengectasiae and rolled borders  --------------------- DX: ______________________ FREDRIC GRADE / N - EDC, EXCIS, MOHS,  TOPICAL, PDT, OBSERV  Transplant: No  Release to patient: Immediate      All areas not commented on are within normal limits or unremarkable      (Approved Template 11/18/2019)

## 2023-11-08 NOTE — Unmapped (Signed)
 Shave biopsy   A shave biopsy involves numbing a small area of your skin and then obtaining a sample to help [place] with proper diagnosis or skin condition. Biopsy results typically return in 7 to 14 days.    To care for the area: Leave the bandage in place until the morning after your procedure is performed. On a daily basis, carefully remove the bandage, then shower or wash as usual. Allow water to run over the site. Please do not scrub. Carefully dry the area, then apply ointment (some people develop an allergy to Neosporin, so we recommend Vaseline orAquaphor). Cover the site with a fresh bandage. Should any bleeding occur, apply firm pressure for 15 minutes. The treated site will heal best if  a scab never forms (the wound heals by new skin cells traveling from the outside toward the middle-their journey is easier if no scab stands in their way).    Long-term care: the site will be more sensitive than your surrounding skin. Keep it covered, and remember to apply sunscreen every day to all your exposed skin. A scar may remain which is lighter or pinker than your normal skin. Your body will continue to improve your scar for up to one year.    Infection following this procedure is rare. However, if you are worried about the appearance of your site, contact your doctor. Complete healing may take up to one month. We have a physician on call at all times. If you have any concerns about the site, please call our clinic at [phone]       [facility] releases most results to you as soon as they are available. Therefore, you may see some results before we do. Please give [place] 3 business days to review the tests and contact you by phone or through MyChart. If you are concerned that some results may be upsetting or confusing, you may wish to wait until we contact you before looking at the report in MyChart.   If you have an urgent question, you can call our clinic. MyChart should not be used for urgent issues. Otherwise, we prefer that you wait 3 business days for [place] to contact you.    [facility] Dermatology Clinical Team

## 2023-11-09 NOTE — Unmapped (Signed)
 Patient contacted and informed of the biopsy results. Plan as follows:      trunk, arms, legs: 2.1- 3 cm  11603   scalp, trunk, arms, legs, axillae: 7.6 cm -12.5  12034    Tracking: yes  Result:   Diagnosis   Date Value Ref Range Status   11/08/2023   Final    Right upper back, shave  - Basal cell carcinoma, nodular-infiltrating type, present in base of biopsy and edge

## 2023-11-14 DIAGNOSIS — I10 Essential (primary) hypertension: Principal | ICD-10-CM

## 2023-11-14 MED ORDER — CARVEDILOL 25 MG TABLET
ORAL_TABLET | Freq: Two times a day (BID) | ORAL | 2 refills | 90.00000 days | Status: CP
Start: 2023-11-14 — End: ?

## 2023-11-15 MED FILL — REPATHA SURECLICK 140 MG/ML SUBCUTANEOUS PEN INJECTOR: SUBCUTANEOUS | 84 days supply | Qty: 6 | Fill #1

## 2023-11-20 DIAGNOSIS — E1165 Type 2 diabetes mellitus with hyperglycemia: Principal | ICD-10-CM

## 2023-11-20 MED ORDER — METFORMIN 500 MG TABLET
ORAL_TABLET | Freq: Two times a day (BID) | ORAL | 1 refills | 90.00000 days | Status: CP
Start: 2023-11-20 — End: 2024-12-24

## 2023-11-21 DIAGNOSIS — E1165 Type 2 diabetes mellitus with hyperglycemia: Principal | ICD-10-CM

## 2023-11-21 MED ORDER — METFORMIN 500 MG TABLET
ORAL_TABLET | Freq: Two times a day (BID) | ORAL | 1 refills | 90.00000 days | Status: CP
Start: 2023-11-21 — End: 2024-12-25

## 2023-11-21 NOTE — Unmapped (Signed)
 Patient is requesting the following refill  Requested Prescriptions     Pending Prescriptions Disp Refills    metFORMIN  (GLUCOPHAGE ) 500 MG tablet 180 tablet 1     Sig: Take 1 tablet (500 mg total) by mouth in the morning and 1 tablet (500 mg total) in the evening. Take with meals.       Recent Visits  Date Type Provider Dept   09/13/23 Office Visit Alyse Slater Pao, MD [facility] [address] [facility]   Showing recent visits within past 365 days and meeting all other requirements  Future Appointments  Date Type Provider Dept   12/26/23 Appointment Alyse Slater Pao, MD [place] [facility] [address] [facility]   Showing future appointments within next 365 days and meeting all other requirements       Labs: Patient is requesting the following refill  Requested Prescriptions      No prescriptions requested or ordered in this encounter       Recent Visits  Date Type Provider Dept   09/13/23 Office Visit Alyse Slater Pao, MD [facility] [address] [facility]   Showing recent visits within past 365 days and meeting all other requirements  Future Appointments  Date Type Provider Dept   12/26/23 Appointment Alyse Slater Pao, MD  [facility] [address] [facility]   Showing future appointments within next 365 days and meeting all other requirements

## 2023-12-04 DIAGNOSIS — G4733 Obstructive sleep apnea (adult) (pediatric): Principal | ICD-10-CM

## 2023-12-04 DIAGNOSIS — I10 Essential (primary) hypertension: Principal | ICD-10-CM

## 2023-12-04 DIAGNOSIS — I2510 Atherosclerotic heart disease of native coronary artery without angina pectoris: Principal | ICD-10-CM

## 2023-12-04 DIAGNOSIS — E7800 Pure hypercholesterolemia, unspecified: Principal | ICD-10-CM

## 2023-12-04 MED ORDER — PRASUGREL HCL 10 MG TABLET
ORAL_TABLET | Freq: Every day | ORAL | 3 refills | 90.00000 days | Status: CP
Start: 2023-12-04 — End: ?

## 2023-12-04 NOTE — Unmapped (Signed)
 DIVISION OF CARDIOLOGY   [facility] [place]        Date of Service: 12/04/2023     Assessment/Plan   1. Essential hypertension  Patient with history of resistant HTN. Now well controlled.   Continue losartan  100 mg daily, HCTZ 12.5 mg daily, coreg  25 mg bid, felodipine  10 mg. Had a few episodes of hypotension that resolved w/ hydration. May consider stopping hydrochlorothiazide  if needed.     2. CAD  S/p PCI to 99% mid LCX on 10/26/22. Had recent recurrent angina and now s/p PCI 02/15/23 to subtotal mRCA lesion with good result. Anginal equivalent: exertional dyspnea and interscapular tightness - no significant recurrence.   - Stop ASA, continue prasugrel  monotherapy  - Restart regular exercise, monitor for recurrent symptoms  - Continue coreg , repatha   - Not interested in GLP1 therapy     3. HLD  Hx statin intolerance.   Now on Repatha  w/ excellent lipid control.     Lab Results   Component Value Date    LDL 34 09/13/2023     4. T2DM  On Jardiance and restarted metformin   He defers GLP1 therapy    Lab Results   Component Value Date    A1C 9.3 (A) 09/13/2023     5. OSA  On CPAP, compliant. Followed by [facility] sleep clinic.      Return to clinic:  Return in about 6 months (around 06/02/2024).    Subjective:   ERE:Bajwzs, Slater Pao, MD  Chief complaint:  68 y.o. male with history of HTN, diabetes, HLD, severe OSA on CPAP, fatty liver disease, CAD s/p PCI who presents for f/up of CAD    History of Present Illness:  Mr. Sellen was initially seen in this clinic for HTN mgmt.   Has hx of statin intolerance. Started on PCSK9i therapy.     In 2024, was having exertional dyspnea and interscapular tightness with heavier exertion, relieved with rest. He had abnormal coronary CTA and underwent PCI to 99% mid LCX 10/26/22. 100% subtotal RCA lesion was unable to be crossed w/ balloons.    Had recent recurrent angina and now s/p PCI 02/15/23 to subtotal mRCA lesion with good result.    Last seen 05/2023. Referred to cardiac rehab.   History of Present Illness  PRATYUSH AMMON is a 68 year old male with hypertension and coronary artery disease who presents with concerns about bleeding due to blood thinners.    He is experiencing bleeding issues, which he attributes to his current use of blood thinners, specifically aspirin  and Prasugrel . He can start bleeding from minor bumps or scratches, such as those caused by his cat. He mentions that he was informed he could potentially stop one of the blood thinners after six months post-stent placement, and reports that it has been more than six months since his last stent.    He has a history of low blood pressure episodes, which he attributes to possible dehydration. He experienced these episodes over two days in the morning, despite drinking a lot of fluids. His blood pressure has since returned to normal levels at home.    He has recently resumed metformin  to better control his blood sugar levels, which he has been monitoring more closely.    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 These changes have been stressful, but he  notes that he is now more relaxed.    He has not been able to participate in cardiac rehab due to logistical and financial constraints but plans to start using the workout facilities at his new apartment complex to increase his physical activity. He reports getting tired quickly and acknowledges the need to improve his physical activity levels.    He experiences occasional morning intrascapular pain, not as severe as prior to stent. He is planning to see a chiropractor as he thinks it may be MSK. The pain occurs during light activities such as doing dishes or cooking, but it does not require him to sit down as before. No significant symptoms in the afternoon.    He is former [employer]	[place], originally from [place] . He previously worked at  [employer] but recently started new job at [employer]. He has 6 cats, lives with his wife.   Father w/ hx of mild stroke, mother w/ MI      Past Medical History  Patient Active Problem List   Diagnosis    Calculus of kidney    Enlarged prostate with (LUTS)    Encounter for long-term (current) use of other medications    Testicular hypofunction    Renal colic    Essential hypertension    Hyperlipidemia    Type II or unspecified type diabetes mellitus without mention of complication, uncontrolled    OSA on CPAP    Hematochezia    Chronic idiopathic constipation    Coronary artery disease of native artery of native heart with stable angina pectoris    Refractory angina pectoris       Medications:  Current Outpatient Medications   Medication Sig Dispense Refill    carvedilol  (COREG ) 25 MG tablet Take 1 tablet (25 mg total) by mouth in the morning and 1 tablet (25 mg total) in the evening. Take with meals. 180 tablet 2    empty container (SHARPS-A-GATOR DISPOSAL SYSTEM) Misc Use as directed for sharps disposal 1 each 2    evolocumab  (REPATHA  SURECLICK) 140 mg/mL PnIj Inject the contents of one pen (140 mg) under the skin every fourteen (14) days. 2 mL 11    felodipine  (PLENDIL ) 10 MG 24 hr tablet TAKE 1 TABLET BY MOUTH ONCE DAILY. 90 tablet 1    JARDIANCE 25 mg tablet Take 1 tablet (25 mg total) by mouth daily.      losartan -hydroCHLOROthiazide  (HYZAAR ) 100-12.5 mg per tablet TAKE 1 TABLET BY MOUTH ONCE DAILY. 90 tablet 3    metFORMIN  (GLUCOPHAGE ) 500 MG tablet Take 1 tablet (500 mg total) by mouth in the morning and 1 tablet (500 mg total) in the evening. Take with meals. 180 tablet 1    nitroglycerin  (NITROSTAT ) 0.4 MG SL tablet Place 1 tablet (0.4 mg total) under the tongue every five (5) minutes as needed for chest pain. Maximum of 3 doses in 15 minutes. 25 tablet 6    CONTOUR NEXT TEST STRIPS Strp  (Patient not taking: Reported on 12/04/2023)      prasugrel  (EFFIENT ) 10 mg tablet Take 1 tablet (10 mg total) by mouth daily. 90 tablet 3     No current facility-administered medications for this visit.       Allergies  Allergies   Allergen Reactions    Statins-Hmg-Coa Reductase Inhibitors Muscle Pain    Amoxicillin Rash    Penicillin Rash       Social History:   Social History     Tobacco Use    Smoking  status: Never     Passive exposure: Past    Smokeless tobacco: Former     Types: Snuff     Quit date: 07/18/1987    Tobacco comments:     Never habitually use   Vaping Use    Vaping status: Never Used   Substance Use Topics    Alcohol use: Not Currently     Comment: Glass of wine, drink of beer, shot of whiskey, maybe 4 a yea    Drug use: Never       Family History:  Family History   Problem Relation Age of Onset    Stroke Mother     Nephrolithiasis Father     Stroke Father     Nephrolithiasis Sister     Hypertension Brother     Prostate cancer Neg Hx     Kidney disease Neg Hx     GU problems Neg Hx        ROS- 12 system review is negative other than what is specified in the History of Present Illness.      Objective:   Physical Exam  Vitals:    12/04/23 1033   BP: 105/63   BP Position: Sitting   Pulse: 57   SpO2: 96%   Weight: 100.1 kg (220 lb 11.2 oz)       Wt Readings from Last 3 Encounters:   12/04/23 100.1 kg (220 lb 11.2 oz)   09/13/23 96.6 kg (213 lb)   06/17/23 100.2 kg (221 lb)     General-  Normal appearing male in no apparent distress.  Neurologic- Alert and oriented X3.  Cranial nerve II-XII grossly intact.  Psych- Normal mood, appropriate.     Laboratory data:  No results found for: PROBNP    Lab Results   Component Value Date    WBC 7.4 09/13/2023    HGB 14.9 09/13/2023    HCT 42.6 09/13/2023    PLT 145 (L) 09/13/2023       Lab Results   Component Value Date    NA 139 09/13/2023    K 4.0 09/13/2023    CL 102 09/13/2023    CO2 27.0 09/13/2023    BUN 26 (H) 09/13/2023    CREATININE 1.06 09/13/2023    GLU 231 (H) 09/13/2023    CALCIUM 10.6 (H) 09/13/2023       Lab Results   Component Value Date    TSH 1.351 09/13/2023    ALBUMIN 4.2 09/13/2023    ALT 26 09/13/2023    AST 18 09/13/2023    INR 1.01 06/09/2022       Lab Results   Component Value Date    LDL 34 09/13/2023    HDL 40 (L) 09/13/2023       Electrocardiogram:  From 04/10/19 showed NSR with inferior T wave inversions. No prior EKGs for comparison.     From 04/05/21 showed NSR, inferior T wave abnormality, largely unchanged.     08/31/22 - showed NSR, inferior T wave abnormality, unchanged.     02/13/23 - sinus brady, inferior TWI    05/31/23 - sinus brady, inferior TWI - unchanged    [facility] AT 09/15/22  Patient had a min HR of 48 bpm, max HR of 133 bpm, and avg HR of 62   bpm. Predominant underlying rhythm was Sinus Rhythm. 1 run of   Supraventricular Tachycardia occurred lasting 4 beats with a max rate of   103 bpm (avg 98 bpm). Isolated SVEs were  rare (<1.0%), SVE Couplets   were rare (<1.0%), and no SVE Triplets were present. Isolated VEs were   rare (<1.0%), VE Couplets were rare (<1.0%), and no VE Triplets were   present. Ventricular Bigeminy was present.     No patient triggered events or diary entries.     Coronary CTA 10/14/22  - Study is limited due to artifact. There is diffuse coronary artery disease including probably obstructive disease in the proximal and mid LAD, as well as the mid RCA. Ostial/proximal RCA is not well visualized.     Nuclear stress test:  From 12/2004 at [facility] showed EF 70% without any stress-induced defects.     Nuclear stress 09/01/21  - Low risk, probably normal myocardial perfusion study   - No evidence of any significant ischemia or scar   - There is a very small in size, mild fixed perfusion abnormality involving the apical cap segments. This is consistent with probable apical thinning, a normal variant, given normal wall motion.   - Left ventricular systolic function is normal. Post stress the ejection fraction is > 65%. The left ventricle is normal in size.   - Scattered coronary calcifications were noted on the attenuation CT   - Incidental CT findings: Mediastinal nodes are somewhat more prominent than usual. There is a water density structure adjacent to the heart on the right (3:28), likely a cyst.     Cardiac cath 10/26/22  - 2 vessel coronary artery disease with 99% stenosis of the mid LCx and 100% subtotal occlusion of the mid RCA  - Unable to cross the mid RCA lesion with 2.0 or 1.5 mm balloons  - Successful PCI of the mid circumflex with a Synergy 2.75x16 mm DES deployed; excellent angiographic result with TIMI 3 flow    Cardiac cath 02/15/23  - 1-vessel coronary artery disease. 99% mid RCA unchanged from prior angiogram 10/26/2022 with faint L to R collaterals. Mid LCX stent is widely patent  - Mildly elevated LVEDP = 19 mmHg.  - Successful OCT-guided PCI of the mid RCA with a 2.5 x 28 mm Xience Skypoint DES, post-dilated proximally to 2.75 mm. Excellent angiographic result with TIMI 3 flow.     Echocardiogram:  From 04/22/19 showed mild LVH, LVEF > 55%. There is grade I diastolic dysfunction (impaired relaxation). The left atrium is mildly dilated in size. The right ventricle is normal in size, with normal systolic function. The aorta at the ascending aorta is mildly dilated.      Calton JINNY Helena, AGNP-C  Cardiology Nurse Practitioner  [facility]

## 2023-12-04 NOTE — Unmapped (Signed)
 Called pt to reschedule appt for tomorrow 9/30 with Lamarr Silva due to the provider's illness. Can reschedule to 10/9 at [facility] in the afternoon or next available.      Asked the patient to call back at [phone].    Thank you,    [employer] [facility]  [phone]

## 2023-12-04 NOTE — Unmapped (Signed)
 VISIT SUMMARY:  Today, we discussed your concerns about bleeding due to blood thinners and reviewed your current medications and health conditions. We made some adjustments to your medication regimen to help manage your symptoms and provided recommendations for increasing your physical activity.    YOUR PLAN:  CORONARY ARTERY DISEASE POST-STENT PLACEMENT: You have coronary artery disease and have had a stent placed. You are experiencing some chest pain, but it is less severe than before the stent.  -Stop taking aspirin  and continue with Prasugrel  as your blood thinner.  -Use the workout facilities at your new apartment complex to increase your physical activity.  -Monitor and report any chest pain, especially during physical activities.    BLEEDING DUE TO DUAL ANTIPLATELET THERAPY: You are experiencing significant bleeding from minor injuries due to taking both aspirin  and Prasugrel .  -Stop taking aspirin  to reduce the risk of bleeding.    ESSENTIAL HYPERTENSION: You have high blood pressure and have experienced some episodes of low blood pressure, possibly due to dehydration.  -Monitor your blood pressure at home and report any episodes of low blood pressure.  -If low blood pressure continues, we may consider stopping hydrochlorothiazide .    TYPE 2 DIABETES MELLITUS: You have type 2 diabetes and have recently resumed taking metformin  to better control your blood sugar levels.  -Continue taking metformin  and Jardiance.  -Increase your physical activity to help control your blood sugar levels.    HYPERLIPIDEMIA: You have high cholesterol, which is being managed with Repatha .  -Continue taking Repatha  for cholesterol management.

## 2023-12-12 NOTE — Unmapped (Signed)
 George Moore, M.D.   Mohs Surgery, [facility]      POST-OPERATIVE INSTRUCTIONS AND WOUND CARE      Post-op restrictions:      Avoid all strenuous activity for one week. You may resume moderate activity (walking, stretching) in 48 hours.    Plan to sleep off of the side of your wound and/or with your wound elevated.   No alcoholic beverages for 48 hours. Avoid smoking, as it is detrimental to wound healing.   Pain management:   First 24-48 hours: Take acetaminophen (Tylenol) 325 mg and ibuprofen 400 mg take every 4-6 hours. Take first dose before lidocaine wears off. After 24-48 hours can take as needed.   Do not exceed 3000 mg of Tylenol per day. Take ibuprofen with food.   If you cannot take ibuprofen, ok to take Tylenol only.   If active bleeding occurs:   Use tightly rolled up gauze or cloth to apply direct pressure over the bandage for 20 minutes.   Reapply pressure for an additional 20 minutes if necessary.   Use additional gauze and tape to maintain pressure once the bleeding has stopped.   Wound Healing:      It is normal to have swelling and bruising around the surgical site. The bruising will fade in approximately 10-14 days. Elevate the area to reduce swelling.   Numbness, itchiness and sensitivity to temperature changes can occur after surgery and may take up to 18 months to normalize.   Although wounds may appear healed within a few weeks, it takes at least 3 months for complete healing and 12 months for a scar to take its final appearance.   Post-operative pain should slowly get better, beginning the evening after surgery.   A sudden or severe increase in pain may indicate a problem. Call our office if this occurs.   For skin grafts, avoid prolonged exposure to extremely cold temperatures for 3 weeks.     After surgery:   ? Keep the WHITE pressure bandage on for 24 hours; this helps prevent bleeding. If the bandage becomes blood-tinged or loose, reinforce it with gauze and tape. Remove after 24 hours.   ? Leave the flat, inner BROWN bandage in place for one week or until you come in for your follow up appointment. If the bandage becomes blood-tinged or loose, reinforce it with gauze and tape. Keep bandages dry.   1 week after surgery:   ? Begin daily wound care as follows:   1. Take off brown bandage and everything under, including Steristrips.   2. Clean the area with soapy water using a Q-tip or gauze pad (shower/bathe normally).   3. Dry the wound with a Q-tip or gauze pad.   4. Apply Vaseline ointment with a Q-tip. * Do NOT use Neosporin ointment *   5. Cover the wound with a Band-Aid or non-stick gauze pad and paper tape.   6. Repeat wound care once a day for one week.   2 weeks after surgery:   ? You can stop covering the wound all the time (just as needed) if the skin has healed together and is no longer open, oozing, scabbing, etc. Keep the wound out of the sun to improve cosmetic outcome.   ? Continue to keep the site moistened all the time with petroleum jelly ointment or plain lotion/moisturizer.   ? At this time, you can also use silicone scar sheets to help minimize scarring: Scar Away sheets, available at drugstore  or online.   4 weeks after surgery:   ? Massaging the area will help the scar soften and fade more quickly. Begin to massage the area one month after the bandages have been removed. To massage, apply pressure directly and firmly over the scar with the fingertips and move in a circular motion. Massage the area several times a day for a few minutes at a time.   ? You may resume your regular skin care routine, including applying make-up and sunscreen.   ? Wound Healing Expectations:   1. Once the bandages are removed, the scar will be red and firm (especially in the lip/chin area). This is normal and will fade in time. It might take 6-12 months for this to happen.   2. Approximately 6-8 weeks after surgery, it is not uncommon to see the formation of ???tender, pimple like??? bumps along the scar. As the scar continues to mature and the stitches underneath the skin begin to dissolve, this might occur. Do not pick or squeeze the bumps; they will resolve on their own. Should one break open, producing a small amount of drainage, apply Polysporin or Bacitracin ointment a few times a day until the wound is completely healed.   3. Numbness in the surgical area is expected. It might take 12-18 months for sensation to return to normal. During this time, itchiness, tingling, and occasional sharp pains might occur. These sensations are normal and will subside once the nerves have completely healed. For any questions or concerns,       For any questions or concerns, call the Mohs clinic: [phone]  (If after-hours, you may call [phone] and ask for the dermatology resident physician on-call)  If there is a true emergency, please proceed to nearest emergency room.

## 2023-12-12 NOTE — Unmapped (Signed)
 Mohs Surgery Consult and Operative Note    HPI:  The patient is seen in consultation at the request of Dr. 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 The patient reports the lesion has been present for 3 years and is associated with itching, bleeding, and pain. There is no history of prior treatment. Patient reports no other new or changing lesions today and has no other new complaints.    Date of biopsy: 11/08/2023  Referring provider: Rollene Canda, MD    Review of systems:   Dermatological ROS: negative   Hematological and Lymphatic ROS: negative    Physical Exam:  General: Well-developed, well-nourished, in no acute distress. Alert and appropriately oriented and interactive.  Skin: 2.8 cm scaly pink plaque noted on right upper back. Rhytids, lentigines, and telangiectasias consistent with solar damage.    Assessment and Plan:  Jointly elected to proceed with Mohs surgery for the [facility] of the right upper back.   Reconstruction with primary closure was performed for wound management.   Diagnosis, etiology, natural history, and treatment discussed with patient. We discussed the importance of surveillance and photoprotection.  Prognosis and future surveillance discussed.  Letter with treatment outcome sent to referring provider.    Photodamage/future skin cancer risk: evidenced by rhytids, telangiectasias, and lentigines, and history of skin cancer. Currently stable, no concerning lesions on exam today. Actinic damage-sun protection measures discussed/advised. Advise continued surveillance in addition to photoprotection.    Mohs Procedure Note:    The nature and purpose of the procedure, associated benefits and risks including recurrence and scarring, possible complications such as pain, infection, and bleeding, and alternative methods of treatment if appropriate were discussed with the patient during consent. The diagnosis was verified by reviewing the pathology/report. The lesion location was verified by the patient, by reviewing previous notes, pathology reports, and by photographs as well as angulation measurements if available. Informed consent was reviewed and signed by the patient. I acted as both Art gallery manager during the case.    Attending surgeon: Manuelita Ohara, MD  EBL: <5 cc  Complications: none    Based on Mohs AUC criteria, the lesion was determined to be appropriate for Mohs surgery based on tumor type, size, and location.    Lesion size:         2.8 cm  Surgical defect/wound size:         3.6 x 3.4 cm  Postoperative dx:                         Same at preoperative diagnosis    Stage 1: Informed consent was obtained from the patient. Time out was performed at 9:09. Anesthesia was achieved with 0.5% lidocaine  with epinephrine. ChloraPrep applied. Sharp debulk of the visible tumor was performed. Two section(s) excised using Mohs technique (this includes total peripheral and deep tissue margin excision and evaluation with frozen sections, excised and interpreted by the same physician). Hemostasis was achieved with electrocautery.    Frozen section analysis revealed infiltrative basal cell carcinoma at the deep margin. Margins positive.    Stage 2: Two sections excised using Mohs technique.  Frozen section analysis revealed a negative margin.    The final defect extended to the level of the subcutaneous fat. Options for management of the wound were discussed. Given the location and size/depth of the wound, we elected for primary repair.    The wound was prepped and draped in the usual  sterile fashion and the area was anesthetized with 0.5% lidocaine  with epinephrine. A 15-blade was used to remove burow's triangles to the level of the subcutaneous fat. Retention sutures were placed to assist in tension-free closure closure of the defect. Hemostasis was achieved with electrocautery. Then, a layered closure was performed. 3-0 PDS was used to close the dermis and subcutaneous tissue including the superficial dermis with buried vertical mattress sutures. The epidermis was then closed using 5-0 fast gut in a running fashion.       Final size of repair: 7.6 cm    The wounds were dressed with telfa, paper tape, and then a pressure bandage was applied over this. Wound care and postoperative pain control was discussed with patient. Patient was provided with wound care sheets and will follow up as needed.

## 2023-12-14 ENCOUNTER — Ambulatory Visit: Admit: 2023-12-14 | Discharge: 2023-12-15 | Payer: Medicare (Managed Care)

## 2023-12-14 DIAGNOSIS — Z125 Encounter for screening for malignant neoplasm of prostate: Principal | ICD-10-CM

## 2023-12-14 DIAGNOSIS — N200 Calculus of kidney: Principal | ICD-10-CM

## 2023-12-14 DIAGNOSIS — R972 Elevated prostate specific antigen [PSA]: Principal | ICD-10-CM

## 2023-12-14 LAB — PSA: PROSTATE SPECIFIC ANTIGEN: 8.19 ng/mL — ABNORMAL HIGH (ref 0.00–4.00)

## 2023-12-14 NOTE — Unmapped (Signed)
 Assessment: 68 year old with elevated PSA of 8.68 with no recent prior PSA.     We had a thoughtful discussion concerning benign and malignant causes of elevated PSA. At this point, I recommend repeating his PSA and if it remains elevated, proceeding with prostate MRI and then to consider transrectal ultrasound-guided biopsy of the prostate (MRI fusion if a suspicious lesion is noted).    I explained that a prostate MRI can be useful in detecting clinically significant prostate cancer. A prostate MRI can detect worrisome prostate lesions. A prostate MRI can also be helpful in treatment planning if prostate cancer be discovered on biopsy.    Should the prostate MRI show a PIRADS 3+ lesion, we would recommend a MRI fusion prostate biopsy to target the lesion and also perform a systematic 12-core prostate biopsy. Should the prostate MRI be negative for a PIRADS 3+ lesion, we would recommend proceeding with either standard prostate biopsy or serial PSA testing.     Plan to follow up to review MRI once completed and discuss next steps.     Plan:  Repeat PSA today  Prostate MRI ordered  Pending results today, proceed with prostate biopsy    I personally spent 25 minutes face-to-face and non-face-to-face in the care of this patient, which includes all pre, intra, and post visit time on the date of service.    Referring Provider: Alyse Slater Pao, MD    Chief Concern: A 68 y.o.-year man seen in consultation at the request of Dr. Alyse Slater Pao, MD for PSA elevation.     History of Present Illness: The patient has been found to have an elevated PSA. From the records that I reviewed PSA was 8.68 in July.     The patient denies prostatitis symptoms such as orchalgia, perineal pain, inguinal pain, and rectal pain. He denies hematuria.    He has not had a prostate biopsy  He has not had an abnormal DRE  He has not had a mpMRI of the prostate    PVR: not completed today    Urology history: history of nephrolithiasis about 12 years ago; has passed stones and had surgical intervention previously    CTA A/P 06/2022: non-obstructing right renal calculi     Family history of prostate cancer:  None    Lab Results   Component Value Date/Time    PSA 8.68 (H) 09/13/2023 02:24 PM    PSASCRN 0.9 08/01/2013 09:12 AM       PMH:  Past Medical History[1]     PSH:  Past Surgical History[2]    Allergies: Allergies[3]     Medications: Current Medications[4]    Review of Systems: See HPI. Negative for other systems in the 12-system questionnaire completed by the patient and reviewed by me today.    Physical Examination:  GENERAL: Pleasant man in no acute distress.   VITAL SIGNS:   Vitals:    12/14/23 1457   BP: 128/59   BP Position: Sitting   Pulse: 57   Weight: 98.9 kg (218 lb)     RESPIRATORY:  Normal chest wall excursion.  EXTREMITIES: No clubbing, cyanosis or edema.   NEUROLOGIC: Alert and oriented x4, normal affect, grossly normal motor and sensory examination.            [1]   Past Medical History:  Diagnosis Date    Acute hypoxic respiratory failure    (CMS-HCC) 02/22/2020    Arthritis     Calculus of kidney  Diabetes mellitus (CMS-HCC)     Type II    Encounter for long-term (current) use of other medications     Hives     Hypertension     Hypertrophy of prostate with urinary obstruction and other lower urinary tract symptoms (LUTS)     Hypokalemia     Hyponatremia     OSA on CPAP     Other testicular hypofunction     Pneumonia due to COVID-19 virus 02/20/2020    Renal colic     Umbilical hernia    [2]   Past Surgical History:  Procedure Laterality Date    ARTHROSCOPY KNEE W/ DRILLING      CARDIOTHORACIC PROCEDURE  8/24    CORONARY STENT PLACEMENT  2024    EXTRACORPOREAL SHOCK WAVE LITHOTRIPSY Right 12/06/2007    NASAL SEPTUM SURGERY      NOSE SURGERY  1988    OTHER SURGICAL HISTORY  2011    Knee, maniscus    PR CATH PLACE/CORON ANGIO, IMG SUPER/INTERP,W LEFT HEART VENTRICULOGRAPHY N/A 10/26/2022    Procedure: Left Heart Catheterization With Intervention;  Surgeon: Gil Franky Barter, MD;  Location: [facility] CATH;  Service: Cardiology    PR CATH PLACE/CORON ANGIO, IMG SUPER/INTERP,W LEFT HEART VENTRICULOGRAPHY N/A 02/15/2023    Procedure: Left Heart Catheterization With Intervention;  Surgeon: Gil Franky Barter, MD;  Location: [facility] CATH;  Service: Cardiology    PR COLONOSCOPY W/BIOPSY SINGLE/MULTIPLE N/A 06/14/2022    Procedure: COLONOSCOPY, FLEXIBLE, PROXIMAL TO SPLENIC FLEXURE; WITH BIOPSY, SINGLE OR MULTIPLE;  Surgeon: Gwenyth Barter Agent, MD;  Location: [facility];  Service: Gastroenterology    SKIN BIOPSY      10+ yesrs ago on the [place]    UMBILICAL HERNIA REPAIR  10/24/2013    w/ INSERTION OF MESH    WISDOM TOOTH EXTRACTION  ?   [3]   Allergies  Allergen Reactions    Statins-Hmg-Coa Reductase Inhibitors Muscle Pain    Amoxicillin Rash    Penicillin Rash   [4]   Current Outpatient Medications:     carvedilol  (COREG ) 25 MG tablet, Take 1 tablet (25 mg total) by mouth in the morning and 1 tablet (25 mg total) in the evening. Take with meals., Disp: 180 tablet, Rfl: 2    CONTOUR NEXT TEST STRIPS Strp, , Disp: , Rfl:     empty container (SHARPS-A-GATOR DISPOSAL SYSTEM) Misc, Use as directed for sharps disposal, Disp: 1 each, Rfl: 2    evolocumab  (REPATHA  SURECLICK) 140 mg/mL PnIj, Inject the contents of one pen (140 mg) under the skin every fourteen (14) days., Disp: 2 mL, Rfl: 11    felodipine  (PLENDIL ) 10 MG 24 hr tablet, TAKE 1 TABLET BY MOUTH ONCE DAILY., Disp: 90 tablet, Rfl: 1    JARDIANCE 25 mg tablet, Take 1 tablet (25 mg total) by mouth daily., Disp: , Rfl:     losartan -hydroCHLOROthiazide  (HYZAAR ) 100-12.5 mg per tablet, TAKE 1 TABLET BY MOUTH ONCE DAILY., Disp: 90 tablet, Rfl: 3    metFORMIN  (GLUCOPHAGE ) 500 MG tablet, Take 1 tablet (500 mg total) by mouth in the morning and 1 tablet (500 mg total) in the evening. Take with meals., Disp: 180 tablet, Rfl: 1    nitroglycerin  (NITROSTAT ) 0.4 MG SL tablet, Place 1 tablet (0.4 mg total) under the tongue every five (5) minutes as needed for chest pain. Maximum of 3 doses in 15 minutes., Disp: 25 tablet, Rfl: 6    prasugrel  (EFFIENT ) 10 mg tablet, Take 1 tablet (10  mg total) by mouth daily., Disp: 90 tablet, Rfl: 3

## 2023-12-20 NOTE — Unmapped (Signed)
 [place] [place] [facility]-- Medication Adherence  CHART REVIEW    To patients reading this note:  Based on information provided by your insurance, the primary purpose of this chart review is to determine the need and/or appropriateness of statin therapy. Any suggested changes are intended to enhance the overall care you receive.      Population:  [place]ET[place]    A chart review was performed based on patient's history of filling a medication indicated for diabetes and third party payer claims data.     Based on chart review, the patient meets the following exclusion criteria:   Patient has history for rhabdomyolysis, myopathy, or myalgia on statin therapy.      Outreached provider to evaluate if exclusion appropriate and document the appropriate billing code, as applicable.        Darryle Almarie Demark, CPP , PharmD  [place] [facility] ([facility])

## 2023-12-25 NOTE — Unmapped (Signed)
 Abstraction Result Flowsheet Data    This patient's last AWV date: : Not Found  This patients last WCC/CPE date: : Not Found      Reason for Encounter  Reason for Encounter: Outreach  Primary Reason for Outreach: AWV  Text Message: No  MyChart Message: No  Outreach Call Outcome: Left Message

## 2023-12-25 NOTE — Unmapped (Signed)
 Abstraction Result Flowsheet Data    This patient's last AWV date: : Not Found  This patients last WCC/CPE date: : Not Found      Reason for Encounter  Reason for Encounter: Outreach  Primary Reason for Outreach: Scientist, research (physical sciences)  Text Message: No  MyChart Message: No  Outreach Call Outcome: Other

## 2023-12-26 ENCOUNTER — Encounter
Admit: 2023-12-26 | Discharge: 2023-12-26 | Payer: Medicare (Managed Care) | Attending: Hematology & Oncology | Primary: Hematology & Oncology

## 2023-12-26 DIAGNOSIS — E1165 Type 2 diabetes mellitus with hyperglycemia: Principal | ICD-10-CM

## 2023-12-26 DIAGNOSIS — I10 Essential (primary) hypertension: Principal | ICD-10-CM

## 2023-12-26 DIAGNOSIS — E7800 Pure hypercholesterolemia, unspecified: Principal | ICD-10-CM

## 2023-12-26 DIAGNOSIS — G4733 Obstructive sleep apnea (adult) (pediatric): Principal | ICD-10-CM

## 2023-12-26 DIAGNOSIS — Z0000 Encounter for general adult medical examination without abnormal findings: Principal | ICD-10-CM

## 2023-12-26 DIAGNOSIS — Z0100 Encounter for examination of eyes and vision without abnormal findings: Principal | ICD-10-CM

## 2023-12-26 DIAGNOSIS — E119 Type 2 diabetes mellitus without complications: Principal | ICD-10-CM

## 2023-12-26 DIAGNOSIS — D696 Thrombocytopenia, unspecified: Principal | ICD-10-CM

## 2023-12-26 LAB — CBC
HEMATOCRIT: 44.6 % (ref 39.0–48.0)
HEMOGLOBIN: 14.6 g/dL (ref 12.9–16.5)
MEAN CORPUSCULAR HEMOGLOBIN CONC: 32.8 g/dL (ref 32.0–36.0)
MEAN CORPUSCULAR HEMOGLOBIN: 28.1 pg (ref 25.9–32.4)
MEAN CORPUSCULAR VOLUME: 85.7 fL (ref 77.6–95.7)
MEAN PLATELET VOLUME: 9.4 fL (ref 6.8–10.7)
PLATELET COUNT: 156 10*9/L (ref 150–450)
RED BLOOD CELL COUNT: 5.21 10*12/L (ref 4.26–5.60)
RED CELL DISTRIBUTION WIDTH: 13 % (ref 12.2–15.2)
WBC ADJUSTED: 6.3 10*9/L (ref 3.6–11.2)

## 2023-12-26 LAB — BASIC METABOLIC PANEL
ANION GAP: 11 mmol/L (ref 5–14)
BLOOD UREA NITROGEN: 17 mg/dL (ref 9–23)
BUN / CREAT RATIO: 18
CALCIUM: 9.8 mg/dL (ref 8.7–10.4)
CHLORIDE: 104 mmol/L (ref 98–107)
CO2: 28 mmol/L (ref 20.0–31.0)
CREATININE: 0.94 mg/dL (ref 0.73–1.18)
EGFR CKD-EPI (2021) MALE: 89 mL/min/1.73m2 (ref >=60)
GLUCOSE RANDOM: 251 mg/dL — ABNORMAL HIGH (ref 70–99)
POTASSIUM: 3.9 mmol/L (ref 3.4–4.8)
SODIUM: 143 mmol/L (ref 135–145)

## 2023-12-26 MED ORDER — METFORMIN 500 MG TABLET
ORAL_TABLET | Freq: Two times a day (BID) | ORAL | 1 refills | 90.00000 days | Status: CP
Start: 2023-12-26 — End: 2024-03-25

## 2023-12-26 NOTE — Unmapped (Signed)
 Patient ID: George Moore is a 68 y.o. male who presents for a Medicare Annual Wellness Visit.    Informant: Patient came to appointment alone..    Assessment/Plan:      Medicare Annual Wellness Visit      Risks identified  There were no significant safety risks identified at today's visit.      Treatment options and their associated risks/benefits were reviewed with the patient.    Advanced care planning  -- Patient was given a copy of the [place]  MOST form to review at home and return for our files.    Personalized Prevention Plan  A personalized prevention plan was reviewed with the patient and a written copy was provided for personal records (available for review in Patient Instructions).    The following preventive services were advised:  Influenza: patient declined    No follow-ups on file.    Subjective:     Health Risk Assessment (HRA)   The HRA was completed and reviewed. The chart was updated where appropriate.    Medical/Family History  Allergies[1]    Medications Prior to Visit[2]    Past Medical History[3]    Past Surgical History[4]    Family History[5]    Functional Abilities/ Level of Safety  Home safety concerns: No  ADL needs: none  iADL needs: none  Fall in the last year: Yes  Hearing difficulty: Yes        Psychosocial risks  Past history of depression: No  Today    Alcohol risk: Yes  Short Social History[6]    Preventive Care  Health Maintenance   Topic Date Due    Medicare Annual Wellness Visit (AWV)  Never done    Foot Exam  Never done    Retinal Eye Exam  Never done    Hepatitis C Screen  Never done    Zoster Vaccines (1 of 2) Never done    Pneumococcal Vaccine 50+ (2 of 2 - PCV) 07/09/2020    COVID-19 Vaccine (1 - 2024-25 season) Never done    Influenza Vaccine (1) Never done    Hemoglobin A1c  12/14/2023    Urine Albumin/Creatinine Ratio  09/12/2024    Serum Creatinine Monitoring  09/12/2024    Potassium Monitoring  09/12/2024    DTaP/Tdap/Td Vaccines (2 - Td or Tdap) 09/21/2026 Lipid Screening  09/12/2028    Colon Cancer Screening  06/13/2032          There is no immunization history on file for this patient.    Current Providers   Patient Care Team:  Alyse Slater Pao, MD as PCP - General (Internal Medicine)  Adina Buel Jansky, MD as PCP - [place] (Family Medicine)  Ramm, Calton Idell BODILY as Nurse Practitioner (Cardiology)    Current Suppliers (DME)    Grab bars in shower    Advance Directive and Code Status   Patient has not yet created a living will.      Objective:     Vital Signs  BP 132/72 (BP Site: L Arm, BP Position: Sitting, BP Cuff Size: Medium)  - Pulse 52  - Temp 36.2 ??C (97.1 ??F) (Temporal)  - Wt 98 kg (216 lb)  - SpO2 98%  - BMI 32.84 kg/m??      Hearing Test   Finger Rub Test:  normal  No hearing aids  Mobility Test   Patient is able to rise from chair without assistance and ambulate without aid: Yes  Cognitive Assessment  It is apparent from the interview that the patient is cognitively intact with high functioning intellectual faculties.      Other Exam           [1]   Allergies  Allergen Reactions    Statins-Hmg-Coa Reductase Inhibitors Muscle Pain    Amoxicillin Rash    Penicillin Rash   [2]   Outpatient Medications Prior to Visit   Medication Sig Dispense Refill    carvedilol  (COREG ) 25 MG tablet Take 1 tablet (25 mg total) by mouth in the morning and 1 tablet (25 mg total) in the evening. Take with meals. 180 tablet 2    CONTOUR NEXT TEST STRIPS Strp       empty container (SHARPS-A-GATOR DISPOSAL SYSTEM) Misc Use as directed for sharps disposal 1 each 2    evolocumab  (REPATHA  SURECLICK) 140 mg/mL PnIj Inject the contents of one pen (140 mg) under the skin every fourteen (14) days. 2 mL 11    felodipine  (PLENDIL ) 10 MG 24 hr tablet TAKE 1 TABLET BY MOUTH ONCE DAILY. 90 tablet 1    JARDIANCE 25 mg tablet Take 1 tablet (25 mg total) by mouth daily.      losartan -hydroCHLOROthiazide  (HYZAAR ) 100-12.5 mg per tablet TAKE 1 TABLET BY MOUTH ONCE DAILY. 90 tablet 3    metFORMIN  (GLUCOPHAGE ) 500 MG tablet Take 1 tablet (500 mg total) by mouth in the morning and 1 tablet (500 mg total) in the evening. Take with meals. 180 tablet 1    nitroglycerin  (NITROSTAT ) 0.4 MG SL tablet Place 1 tablet (0.4 mg total) under the tongue every five (5) minutes as needed for chest pain. Maximum of 3 doses in 15 minutes. 25 tablet 6    prasugrel  (EFFIENT ) 10 mg tablet Take 1 tablet (10 mg total) by mouth daily. 90 tablet 3     No facility-administered medications prior to visit.   [3]   Past Medical History:  Diagnosis Date    Acute hypoxic respiratory failure    (CMS-HCC) 02/22/2020    Arthritis     Calculus of kidney     Diabetes mellitus (CMS-HCC)     Type II    Encounter for long-term (current) use of other medications     Hives     Hypertension     Hypertrophy of prostate with urinary obstruction and other lower urinary tract symptoms (LUTS)     Hypokalemia     Hyponatremia     OSA on CPAP     Other testicular hypofunction     Pneumonia due to COVID-19 virus 02/20/2020    Renal colic     Umbilical hernia    [4]   Past Surgical History:  Procedure Laterality Date    ARTHROSCOPY KNEE W/ DRILLING      CARDIOTHORACIC PROCEDURE  8/24    CORONARY STENT PLACEMENT  2024    EXTRACORPOREAL SHOCK WAVE LITHOTRIPSY Right 12/06/2007    NASAL SEPTUM SURGERY      NOSE SURGERY  1988    OTHER SURGICAL HISTORY  2011    Knee, maniscus    PR CATH PLACE/CORON ANGIO, IMG SUPER/INTERP,W LEFT HEART VENTRICULOGRAPHY N/A 10/26/2022    Procedure: Left Heart Catheterization With Intervention;  Surgeon: Gil Franky Barter, MD;  Location: [facility] CATH;  Service: Cardiology    PR CATH PLACE/CORON ANGIO, IMG SUPER/INTERP,W LEFT HEART VENTRICULOGRAPHY N/A 02/15/2023    Procedure: Left Heart Catheterization With Intervention;  Surgeon: Gil Franky Barter, MD;  Location: [facility] CATH;  Service: Cardiology    PR COLONOSCOPY W/BIOPSY SINGLE/MULTIPLE N/A 06/14/2022    Procedure: COLONOSCOPY, FLEXIBLE, PROXIMAL TO SPLENIC FLEXURE; WITH BIOPSY, SINGLE OR MULTIPLE;  Surgeon: Gwenyth Prentice Agent, MD;  Location: [facility];  Service: Gastroenterology    SKIN BIOPSY      10+ yesrs ago on the [place]    UMBILICAL HERNIA REPAIR  10/24/2013    w/ INSERTION OF MESH    WISDOM TOOTH EXTRACTION  ?   [5]   Family History  Problem Relation Age of Onset    Stroke Mother     Nephrolithiasis Father     Stroke Father     Nephrolithiasis Sister     Hypertension Brother     Prostate cancer Neg Hx     Kidney disease Neg Hx     GU problems Neg Hx    [6]   Social History  Tobacco Use    Smoking status: Never     Passive exposure: Past    Smokeless tobacco: Former     Types: Snuff     Quit date: 07/18/1987    Tobacco comments:     Never habitually use   Vaping Use    Vaping status: Never Used   Substance Use Topics    Alcohol use: Not Currently     Comment: Glass of wine, drink of beer, shot of whiskey, maybe 4 a yea    Drug use: Never

## 2023-12-26 NOTE — Unmapped (Signed)
 [facility] Note:  [name], [name]. Phone [phone]    12/26/2023    Patient Name:   George Moore    MRN: [id number]    Demographics:    Age-  68 y.o.     Date of Birth-  1955-03-19    Chief complaint (CC):    Chief Complaint   Patient presents with    Annual Exam     Patient is fasting       Assessment/Plan:    Pleasant 68 y.o. male with:    Assessment & Plan  Type 2 Diabetes Mellitus  HGB AIC improved to 8.0 from 9.3 wth metformin , jardiance and diet changes.   - Increase metformin  500 mg 2 tablet po BID.   - Continue Jardiance 10 mg daily.  - Order hemoglobin A1c every three months.  - Provide dietary counseling to reduce carbohydrate intake, emphasizing avoidance of white foods.  - Encourage regular exercise.  - BMP  Chronic Skin Lesion on back  Chronic lesion on back, concerning for squamous cell carcinoma. Referral to dermatology needed for evaluation and possible excision.  - Prescribe doxycycline  100 mg twice daily for seven days.  - Refer to a dermatologist for evaluation and possible excision.    CAD:  Coronary artery disease with two stents. Continue current antiplatelet therapy.  - Continue aspirin  and prasugrel  as prescribed.  - Plan to discontinue prasugrel  after one year of therapy.  - Patient has follow up with Cardiology in 3 months.    Hypertension  BP at goal.  Patient is taking Losartan - hydrochlorothiazide  110-12.5 mg po every day    OSA on CPAP  Patient is having daytime fatigue.   Patient was diagnosed by Dr. Salvatore in [place].   Patient requests second opinion form [facility] neurology sleep clinic. Discuss aspire.     Thrombocytopenia:  Plt was 145.000 improving from 136.000  Denies bruising or bleeding.   - Recheck CBC today.     Elevated PSA  Patient has seen Urology for elevated PSA.  Patient is having MRI prostate 01/2024.     Hyperlipidemia  Good LDL control with Repatha . Consider discontinuation based on future lipid levels.    General Health Maintenance  Due for retinal eye exam    - Refer to [facility] for retinal eye exam.    Follow-up  Requires follow-up for diabetes management and dermatological evaluation.  - Schedule follow-up appointment in 4 months for diabetes management.  - Provide referral information for dermatology follow-up.     Diagnosis ICD-10-CM Associated Orders   1. OSA on CPAP  G47.33 Ambulatory referral for Sleep Consult      2. Type 2 diabetes mellitus with hyperglycemia, without long-term current use of insulin  (CMS-HCC)  E11.65 POCT glycosylated hemoglobin (Hb A1C)     metFORMIN  (GLUCOPHAGE ) 500 MG tablet     HM DIABETES FOOT EXAM     Basic Metabolic Panel      3. Pure hypercholesterolemia  E78.00       4. Essential hypertension  I10       5. Diabetic eye exam (CMS-HCC)  Z01.00 Ambulatory referral to Ophthalmology    E11.9       6. Thrombocytopenia  D69.6 CBC          I personally spent 45 minutes face-to-face and non-face-to-face in the care of this patient, which includes all pre, intra, and post visit time on the date of service.  All documented time  was specific to the E/M visit and does not include any procedures that may have been performed. We discussed medical, dietary, lifestyle, and health maintenance modifications to optimize health. Standard precautions followed during visit. Medication adherence and barriers to the treatment plan have been addressed.  Patient voiced understanding, needs F/U visit 45 minutes.     Health Maintenance:   Health Maintenance Due   Topic Date Due    Medicare Annual Wellness Visit (AWV)  Never done    Foot Exam  Never done    Retinal Eye Exam  Never done    Hepatitis C Screen  Never done    Zoster Vaccines (1 of 2) Never done    Pneumococcal Vaccine 50+ (2 of 2 - PCV) 07/09/2020    COVID-19 Vaccine (1 - 2024-25 season) Never done    Influenza Vaccine (1) Never done       Subjective:    History of present illness (HPI):      History of Present Illness  George Moore is a 68 year old male who presents to establish care.    He has a history of type 2 diabetes and is currently taking Jardiance 10 mg daily. His last hemoglobin A1c was checked over a year ago. He uses natural remedies in addition to his medication. He acknowledges that his diet and exercise habits are not optimal, which may be affecting his blood sugar control.    He has hypertension and is taking losartan  HCTZ 100/12.5 mg daily, carvedilol  25 mg twice a day, and felodipine . His blood pressure is currently well-controlled with this regimen.    He has a history of coronary artery disease and has undergone two stent placements after blockages were found during a CT scan. He is currently taking aspirin  and prasugrel  (Effient ) as part of his treatment regimen. He is scheduled to see his cardiologist in September. No chest pain today and has never had a heart attack, as his coronary issues were addressed before any cardiac event occurred.    He has hyperlipidemia and is currently on Repatha . His LDL levels were previously high but have since improved.    He reports a chronic skin lesion on his back, present for over two years, which sometimes itches and bleeds. He does not have a dermatologist and has not had a recent evaluation for this lesion. His sister has had skin cancers on her arm and has very frail skin.    He reports minimal alcohol consumption, with a shot every three months or a beer occasionally, and denies smoking. He is in the process of moving from [place] to [place].    Denies HA, fever, chest pain, shortness of breath, N/V/D, bowel or bladder issues, vision or hearing changes, or swelling.     Relevant ROS: Reviewed 12 systems, positive findings listed, all others negative.    Pertinent Past Med Hx:  Past Medical History[1]    Medications:     Current Medications[2]     Allergies:   Allergies[3]    Pertinent Social Hx and Habits: EMR reviewed    Pertinent Family Hx: EMR reviewed    Objective:      BP Readings from Last 3 Encounters:   12/26/23 132/72   12/14/23 128/59   12/04/23 105/63        Vitals:    12/26/23 1056   BP: 132/72   BP Site: L Arm   BP Position: Sitting   BP Cuff Size: Medium   Pulse: 52  Temp: 36.2 ??C (97.1 ??F)   TempSrc: Temporal   SpO2: 98%   Weight: 98 kg (216 lb)        Physical Exam:    Physical Exam  SKIN: Skin lesion on back, 2 cm x 1 cm raised carbuncle.    General Appearance: WDWN in NAD      Skin: W, D, I right upper back skin lesion which is red crusted and eryhematous with irregular borders concerning for skin cancer. 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  HEENT: PERRLA, EOMI, TM's clear  Respiratory: Clear throughout  Cardio: RRR  Abdomen: Soft and non-tnder  Neurologic: A & O X 4, Grossly intact, stable gait  PSYCH: Behavior calm and cooperative    Diagnostics:     Results  LABS  A1c: 9.3% (09/13/2023)    RADIOLOGY  CT scan: Obstruction noted (10/2022)    DIAGNOSTIC  EKG: Abnormal findings (10/2022)    PATHOLOGY  Skin lesion biopsy: 2 cm x 1 cm raised carbuncle, chronic    Lab Results   Component Value Date    WBC 7.4 09/13/2023    HGB 14.9 09/13/2023    HCT 42.6 09/13/2023    PLT 145 (L) 09/13/2023       Lab Results   Component Value Date    NA 139 09/13/2023    K 4.0 09/13/2023    CL 102 09/13/2023    CO2 27.0 09/13/2023    BUN 26 (H) 09/13/2023    CREATININE 1.06 09/13/2023    GLU 231 (H) 09/13/2023    CALCIUM 10.6 (H) 09/13/2023       Lab Results   Component Value Date    BILITOT 1.9 (H) 09/13/2023    BILIDIR 0.40 (H) 06/09/2022    PROT 7.6 09/13/2023    ALBUMIN 4.2 09/13/2023    ALT 26 09/13/2023    AST 18 09/13/2023    ALKPHOS 75 09/13/2023       Lab Results   Component Value Date    PT 11.2 06/09/2022    INR 1.01 06/09/2022    APTT 27.1 06/09/2022        TSH   Date Value Ref Range Status   09/13/2023 1.351 0.550 - 4.780 uIU/mL Final          Slater MARLA Sole, MD              [1]   Past Medical History:  Diagnosis Date    Acute hypoxic respiratory failure    (CMS-HCC) 02/22/2020    Arthritis Calculus of kidney     Diabetes mellitus (CMS-HCC)     Type II    Encounter for long-term (current) use of other medications     Hives     Hypertension     Hypertrophy of prostate with urinary obstruction and other lower urinary tract symptoms (LUTS)     Hypokalemia     Hyponatremia     OSA on CPAP     Other testicular hypofunction     Pneumonia due to COVID-19 virus 02/20/2020    Renal colic     Umbilical hernia    [2]   Current Outpatient Medications:     carvedilol  (COREG ) 25 MG tablet, Take 1 tablet (25 mg total) by mouth in the morning and 1 tablet (25 mg total) in the evening. Take with meals., Disp: 180 tablet, Rfl: 2    CONTOUR NEXT TEST STRIPS Strp, , Disp: , Rfl:     empty container (  SHARPS-A-GATOR DISPOSAL SYSTEM) Misc, Use as directed for sharps disposal, Disp: 1 each, Rfl: 2    evolocumab  (REPATHA  SURECLICK) 140 mg/mL PnIj, Inject the contents of one pen (140 mg) under the skin every fourteen (14) days., Disp: 2 mL, Rfl: 11    felodipine  (PLENDIL ) 10 MG 24 hr tablet, TAKE 1 TABLET BY MOUTH ONCE DAILY., Disp: 90 tablet, Rfl: 1    JARDIANCE 25 mg tablet, Take 1 tablet (25 mg total) by mouth daily., Disp: , Rfl:     losartan -hydroCHLOROthiazide  (HYZAAR ) 100-12.5 mg per tablet, TAKE 1 TABLET BY MOUTH ONCE DAILY., Disp: 90 tablet, Rfl: 3    metFORMIN  (GLUCOPHAGE ) 500 MG tablet, Take 1 tablet (500 mg total) by mouth in the morning and 1 tablet (500 mg total) in the evening. Take with meals., Disp: 180 tablet, Rfl: 1    nitroglycerin  (NITROSTAT ) 0.4 MG SL tablet, Place 1 tablet (0.4 mg total) under the tongue every five (5) minutes as needed for chest pain. Maximum of 3 doses in 15 minutes., Disp: 25 tablet, Rfl: 6    prasugrel  (EFFIENT ) 10 mg tablet, Take 1 tablet (10 mg total) by mouth daily., Disp: 90 tablet, Rfl: 3  [3]   Allergies  Allergen Reactions    Statins-Hmg-Coa Reductase Inhibitors Muscle Pain    Amoxicillin Rash    Penicillin Rash

## 2024-01-17 ENCOUNTER — Inpatient Hospital Stay: Admit: 2024-01-17 | Discharge: 2024-01-17 | Payer: Medicare (Managed Care)

## 2024-01-17 MED ADMIN — gadopiclenol (ELUCIREM,VUEWAY) injection 9.8 mL: 9.8 mL | INTRAVENOUS | @ 17:00:00 | Stop: 2024-01-17

## 2024-02-07 MED FILL — REPATHA SURECLICK 140 MG/ML SUBCUTANEOUS PEN INJECTOR: SUBCUTANEOUS | 84 days supply | Qty: 6 | Fill #2

## 2024-02-09 ENCOUNTER — Encounter: Admit: 2024-02-09 | Discharge: 2024-02-10 | Payer: Medicare (Managed Care)

## 2024-02-09 DIAGNOSIS — R972 Elevated prostate specific antigen [PSA]: Principal | ICD-10-CM

## 2024-02-09 NOTE — Patient Instructions (Signed)
 PROSTATE BIOPSY    KEY POINTS    Your prostate biopsy will be performed while you're awake, we will provide numbing medication to minimize discomfort.  You must use a fleet enema (can be purchased at your local pharmacy) on the night before or morning of your biopsy. If not performed beforehand, your biopsy may be cancelled.  If you take a blood thinning medication, make sure to reach out to your primary care doctor to check if it is okay to stop taking it for a period of time before your biopsy.    What is a prostate biopsy?  A biopsy is a procedure where samples are taken from an organ, and those sample are examined under a microscope to look for cancer. During a prostate biopsy, there are usually 12 or more samples taken of different areas to check for cancer.    Why do I need a prostate biopsy?  Prostate cancer effects 1 in 6 American men. If a prostate biopsy has been recommended, it is usually because you are at increased risk for having prostate cancer based on your PSA blood test level, rectal exam, age, family history, ethnic background, and several other factors. A prostate biopsy is the only way to definitively diagnose prostate cancer.    How is a prostate biopsy performed?  There are two ???Sampling Approaches,??? which differ in the equipment used to perform your biopsy, and the number of biopsy samples that are taken. There are also two ???Biopsy Techniques,??? which differ in how the prostate is accessed. See the table and figure below for more information.     Sampling Approach Biopsy Technique Biopsy Setting   Standard Biopsy - biopsy is performed in a grid-like fashion. Twelve biopsy samples are taken. This is recommended for patients with a negative MRI or those with evidence of advanced prostate cancer. Transrectal Biopsy - an ultrasound probe is gently advanced into the rectum. Local anesthetic is given to the nerves of the prostate. Biopsy samples are obtained by accessing the prostate through the rectum. In this approach, the patient is lying on his side. Office - this is the standard approach to prostate biopsy, performed in the clinic using local anesthetic. This type of biopsy is performed with the patient awake. It typically takes 10-15 minutes and the patient can leave, unaccompanied after the procedure.   MRI-Ultrasound Fusion Biopsy - to increase detection, we often recommend a prostate MRI to identify suspicious lesions. Then using computerized software, we overlay the MRI on top of live [place] to guide the biopsy. Samples are taken from the suspicious MRI target lesions, in addition to the 12-core standard biopsy grid as described above. Transperineal Biopsy - an ultrasound probe is gently advanced into the rectum. Local anesthetic is given in the perineum (skin between scrotum and rectum) and to the nerves of the prostate. Biopsy samples are obtained by accessing the prostate via the perineum. In this approach, the patient is lying on his back with his legs spread. This approach is associated with a lower risk of infection compared to transrectal but may have more temporary discomfort. Sedation - this approach is usually reserved for patients who cannot tolerate the procedure while awake. The biopsy is performed while the patient is asleep under sedation. Thi is associated with increased risks attributed to the anesthetic medications. This type of biopsy takes longer due to initiation and recovery from anesthesia. The patient is required to have an escort with this biopsy approach. The patient cannot eat or  drink anything starting at midnight before the biopsy.       PREPARATION:    You must perform an enema before your biopsy (night before or morning of) to help clean out your rectum and decrease the risk of infection. Fleet enemas are available over-the-counter at most pharmacies. You may be given antibiotics in the Urology clinic on the day of your biopsy, depending on provider preferences and biopsy technique.     You should avoid non-steroidal anti-inflammatory medications such as ibuprofen (Advil, Motrin, Aleve) for 7 days prior to your biopsy. You can continue to take Aspirin. If you take blood thinners like Coumadin, Eliquis, Xarelto, ask your doctor how long you should stop taking these medications before your biopsy.  Please check with your primary doctor and ensure that it is safe for you to???    You should call your primary doctor and say ???My urologist needs to biopsy my prostate and is worried about bleeding problems because of the medicine I take. Can I???  [ ]  Stop Coumadin (Warfarin) or Plavix (Clopidogrel) 5 days prior to prostate biopsy?  [ ]  Stop Eliquis (Apixaban) or Xarelto (Rivaroxaban) 3 days prior to prostate biopsy?  [ ]  Stop Lovenox (Enoxaparin) injections 24 hours prior to prostate biopsy?    Please make sure to wear loose fitting clothing (I.e. T-shirt, athletic shorts, sweatpants) as you will be asked to change into a gown prior to your prostate biopsy.    If you are taking a blood thinner not listed above, ask your doctor if it is safe to stop taking the medication for 7 days prior to your biopsy. Your primary doctor may call the Urology clinic to speak to the urologist at [phone].    PRECAUTIONS:     Expected side effects:  Bloody urine: can be expected for 2-7 days after your biopsy sometimes longer.   Blood in the stool: can be expected for 2-5 days after your biopsy.  Blood in the semen: can be expected for 4-12 weeks after the biopsy.  Discomfort: you may experience discomfort for 12-24 hours after the biopsy.  Sexual Dysfunction: you may experience a decrease in the quality of your erections for up to 12 weeks after the biopsy.    When to call your doctor:  Infection: this occurs in <2% of biopsies at [facility]. If you have a fever >100.5??F, chills, or significant problems with urination, you may require antibiotics in the hospital. Antibiotics and an enema are given before the biopsy to lower the risk of infection. A transperineal approach may further reduce this risk.   Inability to urinate: this occurs in 1% of men after the biopsy because your urine travels through the prostate on its way out of the body. Swelling from the biopsy can block the flow of urine, requiring that a catheter be placed to drain urine. This typically resolves and the catheter is removed in 3 days.    Are there any restrictions after the biopsy is performed?  Do not exercise, strain or lift any heavy objects for 3 days following the biopsy. Do not have sexual intercourse or ejaculate for 3 days following the biopsy. This is to help prevent further bleeding.    When should I expect to get the results of the biopsy?  Biopsy results take about 1-week to process. A virtual visit will be schedule with a urology team member to review and arrange next steps. Results may be released to you before this appointment.    Emergency  Contact information:     Urology Clinic  Hours: M-F 8:00am-4:00pm  Phone: [phone]     Physician On-Call: (Urgent concerns only)  Hours: M-F 4:00pm - 8:00am, Saturdays, Sundays and Holidays (24 hrs)  Contact: Hospital Operator (ask for the Urology doctor on call)  Phone: [phone]

## 2024-02-09 NOTE — Progress Notes (Addendum)
 The patient reports they are physically located in [place]  and is currently: at home. I conducted a audio/video visit. I spent  3m 28s on the video call with the patient. I spent an additional 10 minutes on pre- and post-visit activities on the date of service .         Assessment: 68 year old with elevated PSA of 8.68 and MRI with PR 3 and 4 lesions.     We reviewed MRI results and discussed prostate biopsy as the only way to definitively diagnose malignancy. I discussed the procedure types available - including transrectal and transperineal approaches. I explained that the transperineal approach has a lower complication rate related to infection (whereas the transrectal approach has a higher infection rate given the transrectal approach - and therefore requires two antibiotics to minimize risk). For both procedures, there is a risk of pain and bleeding, as well as the expected minor complications of hematuria, hematochezia, and hematospermia which resolve over 6 weeks.    We discussed sedation options including valium and general anesthesia. Discussion of the enema and holding anticoagulation were carried out in the clinic today, and this was supplemented with written instructions.     Ultimately he prefers to proceed with TR fusion biopsy. All questions were answered.     Addendum from NP Ramm: He can switch prasugrel  to baby aspirin  7 days prior and then switch back to prasugrel  monotherapy post procedure.      Plan:  - Prostate biopsy: TR MRI fusion  - Follow up to review pathology     Referring Provider: Alyse Slater Pao, MD    Chief Concern: Elevated PSA    History of Present Illness:   A 68 y.o.-year man seen in consultation at the request of Dr. Alyse, Slater Pao, MD for PSA elevation. From the records that I reviewed PSA was 8.68 in July.     The patient denies prostatitis symptoms such as orchalgia, perineal pain, inguinal pain, and rectal pain. He denies hematuria.    He has not had a prostate biopsy  He has not had an abnormal DRE  He has not had a mpMRI of the prostate    PVR: not completed today    Urology history: history of nephrolithiasis about 12 years ago; has passed stones and had surgical intervention previously    CTA A/P 06/2022: non-obstructing right renal calculi     Family history of prostate cancer:  None    Interval 02/09/24  Prostate MRI with PR 3 lesion (left PZ), PR 4 lesions (left and right TZ)  Prominent indeterminate right common femoral chain LN    History of cardiac stents - taking prasugrel  for anticoagulation   Follows with NP Ramm       Lab Results   Component Value Date/Time    PSA 8.19 (H) 12/14/2023 03:39 PM    PSA 8.68 (H) 09/13/2023 02:24 PM    PSASCRN 0.9 08/01/2013 09:12 AM       PMH:  Past Medical History[1]     PSH:  Past Surgical History[2]    Allergies: Allergies[3]     Medications: Current Medications[4]    Review of Systems: See HPI. Negative for other systems in the 12-system questionnaire completed by the patient and reviewed by me today.    Physical Examination:  GENERAL: Pleasant man in no acute distress.   VITAL SIGNS:   There were no vitals filed for this visit.    Lab Results   Component Value  Date/Time    PSA 8.19 (H) 12/14/2023 03:39 PM    PSA 8.68 (H) 09/13/2023 02:24 PM    PSASCRN 0.9 08/01/2013 09:12 AM       MRI Prostate W Wo Contrast  Status: Final result     PACS Images     Show images for MRI Prostate W Wo Contrast MRI Screening Form    Screening Form      Impression   1.  PI-RADS 4 lesions seen within the anterior transitional zone, bilaterally (as detailed).   2.  Additional PI-RADS 3 lesion noted along the left anterior peripheral zone about the prostate apex, measuring up to 0.6 cm in greatest dimension with associated abutment of the urethra.   3.  Note made of several prominent right common femoral chain lymph nodes, which measure up to 1.0 cm in short axis. These are nonspecific and could potentially be reactive in nature, however, any potential sites of early nodal metastasis or not entirely excluded (given the above). Continued attention is recommended on follow-up.               PI-RADSregistered v2.1 Assessment Categories   PI-RADS 1 - Very low (clinically significant cancer is highly unlikely to be present)   PI-RADS 2 - Low (clinically significant cancer is unlikely to be present)   PI-RADS 3 - Intermediate (the presence of clinically significant cancer is equivocal)   PI-RADS 4 - High (clinically significant cancer is likely to be present)   PI-RADS 5 - Very high (clinically significant cancer is highly likely to be present     Narrative   EXAM: MRI PROSTATE W WO CONTRAST (PROSTATE PROTOCOL)   ACCESSION: [id number] UN   CLINICAL INDICATION: elevated PSA  - R97.20 - Elevated PSA        COMPARISON: None      TECHNIQUE: Multisequence and multiplanar magnetic resonance imaging of the pelvis was performed with and without intravenous contrast with prostate cancer protocol. .      CONTRAST: 9.8 mL of Gadopiclenol       Clinical history of prostate cancer: None   Prior biopsy: Unknown   Most recent PSA: 8.2 ng/mL         FINDINGS:      QUALITY: Adequate      HEMORRHAGE: Absent      PROSTATE SIZE: Prostate measures 4.9 x 3.5 x 3.5 cm with calculated total volume of 30 cc.      PERIPHERAL ZONE: Scattered areas of heterogeneous T2 signal hypointensity which may reflect sequela of prostatitis or scarring. Suspicious lesions as below:      Lesion 1:   Size: 0.5 x 0.2 x 0.6 cm   Location: Left anterior peripheral zone along the level of the prostate apex. Associated abutment of the urethra.   T2: Mild ill-defined low T2 signal PI-RADS: 3   DWI: Marked hypointense signal on ADC map (8:16; 9:16) with mild signal hyperintensity on DWI PI-RADS: 3   DCE: Negative   Prostate margin: Does abut the prostate margin   Extracapsular Extension: Absent   Overall Lesion PI-RADS category: 3      TRANSITIONAL ZONE: Central gland enlargement with changes of benign prostatic hypertrophy. Suspicious lesions as below:      Lesion 2:   Size: 1.0 x 0.6 x 0.9 cm   Location: Left anterior transition zone along the level of the prostate mid gland   T2: Ill-defined moderate low T2 signal hypointensity (4:12) PI-RADS: 4   DWI: Marked low signal on  ADC map with corresponding hyperintensity on DWI PI-RADS: 4   DCE: Positive   Prostate margin: Does not abut the prostate margin   Extracapsular Extension: Absent   Overall Lesion PI-RADS category: 4      Lesion 3:   Size: 1.6 x 1.0 x 0.9 cm   Location: Right anterior transitional zone along the level of the prostate mid gland to apex.   T2: Heterogeneous T2 signal hypointensity with obscured margins (4:14) PI-RADS: 3   DWI: Marked low signal on ADC map with corresponding hyperintensity on diffusion weighted imaging PI-RADS: 5   DCE: Positive   Prostate margin: Does abut the prostate margin   Extracapsular Extension: Absent   Overall Lesion PI-RADS category: 4      SEMINAL VESICLES: Unremarkable. Seminal vesicles are symmetric in appearance.      NEUROVASCULAR BUNDLES: Unremarkable.      BLADDER: Urinary bladder demonstrates mild circumferential wall thickening with scattered trabeculations, which may reflect sequelae of chronic bladder outlet obstruction (given prostamegaly).      LYMPH NODES: No several prominent right common femoral chain lymph nodes are identified (4:2-5), which measure up to 1.0 cm in short axis and are felt indeterminate. Any potential sites of early nodal metastasis or not entirely excluded.      OTHER: No pelvic free fluid or drainable collection.      VESSELS: The visualized pelvic vasculature appears to be patent.      LARGE FIELD-OF-VIEW: No findings of bowel obstruction on large field-of-view imaging.      BONES AND SOFT TISSUES: Bilateral fat-containing inguinal hernias. No worrisome osseous lesion is identified.          [1]   Past Medical History:  Diagnosis Date    Acute hypoxic respiratory failure (CMS-HCC) 02/22/2020    Arthritis     Calculus of kidney     Diabetes mellitus (CMS-HCC)     Type II    Encounter for long-term (current) use of other medications     Hives     Hypertension     Hypertrophy of prostate with urinary obstruction and other lower urinary tract symptoms (LUTS)     Hypokalemia     Hyponatremia     OSA on CPAP     Other testicular hypofunction     Pneumonia due to COVID-19 virus 02/20/2020    Renal colic     Umbilical hernia    [2]   Past Surgical History:  Procedure Laterality Date    ARTHROSCOPY KNEE W/ DRILLING      CARDIOTHORACIC PROCEDURE  8/24    CORONARY STENT PLACEMENT  2024    EXTRACORPOREAL SHOCK WAVE LITHOTRIPSY Right 12/06/2007    NASAL SEPTUM SURGERY      NOSE SURGERY  1988    OTHER SURGICAL HISTORY  2011    Knee, maniscus    PR CATH PLACE/CORON ANGIO, IMG SUPER/INTERP,W LEFT HEART VENTRICULOGRAPHY N/A 10/26/2022    Procedure: Left Heart Catheterization With Intervention;  Surgeon: Gil Franky Barter, MD;  Location: [facility] CATH;  Service: Cardiology    PR CATH PLACE/CORON ANGIO, IMG SUPER/INTERP,W LEFT HEART VENTRICULOGRAPHY N/A 02/15/2023    Procedure: Left Heart Catheterization With Intervention;  Surgeon: Gil Franky Barter, MD;  Location: [facility] CATH;  Service: Cardiology    PR COLONOSCOPY W/BIOPSY SINGLE/MULTIPLE N/A 06/14/2022    Procedure: COLONOSCOPY, FLEXIBLE, PROXIMAL TO SPLENIC FLEXURE; WITH BIOPSY, SINGLE OR MULTIPLE;  Surgeon: Gwenyth Barter Agent, MD;  [facility];  Service: Gastroenterology  SKIN BIOPSY      10+ yesrs ago on the [place]    UMBILICAL HERNIA REPAIR  10/24/2013    w/ INSERTION OF MESH    WISDOM TOOTH EXTRACTION  ?   [3]   Allergies  Allergen Reactions    Statins-Hmg-Coa Reductase Inhibitors Muscle Pain    Amoxicillin Rash    Penicillin Rash   [4]   Current Outpatient Medications:     carvedilol  (COREG ) 25 MG tablet, Take 1 tablet (25 mg total) by mouth in the morning and 1 tablet (25 mg total) in the evening. Take with meals., Disp: 180 tablet, Rfl: 2    CONTOUR NEXT TEST STRIPS Strp, , Disp: , Rfl:     empty container (SHARPS-A-GATOR DISPOSAL SYSTEM) Misc, Use as directed for sharps disposal, Disp: 1 each, Rfl: 2    evolocumab  (REPATHA  SURECLICK) 140 mg/mL PnIj, Inject the contents of one pen (140 mg) under the skin every fourteen (14) days., Disp: 2 mL, Rfl: 11    felodipine  (PLENDIL ) 10 MG 24 hr tablet, TAKE 1 TABLET BY MOUTH ONCE DAILY., Disp: 90 tablet, Rfl: 1    JARDIANCE 25 mg tablet, Take 1 tablet (25 mg total) by mouth daily., Disp: , Rfl:     losartan -hydroCHLOROthiazide  (HYZAAR ) 100-12.5 mg per tablet, TAKE 1 TABLET BY MOUTH ONCE DAILY., Disp: 90 tablet, Rfl: 3    metFORMIN  (GLUCOPHAGE ) 500 MG tablet, Take 2 tablets (1,000 mg total) by mouth in the morning and 2 tablets (1,000 mg total) in the evening. Take with meals., Disp: 360 tablet, Rfl: 1    prasugrel  (EFFIENT ) 10 mg tablet, Take 1 tablet (10 mg total) by mouth daily., Disp: 90 tablet, Rfl: 3

## 2024-02-20 NOTE — Telephone Encounter (Signed)
 Preprocedure call to discuss prostate biopsy. Left message. Sent mc msg.

## 2024-03-11 NOTE — Patient Instructions (Signed)
 Patient Instructions:                         Outpatient Prostate Procedures    After your procedure:    Do not exercise, strain or lift any heavy objects for 3 days following the biopsy. This is to help prevent bleeding.    Do not have sexual intercourse or ejaculate for one week following the biopsy. This is to help prevent further bleeding.    You can expect to see some blood in your urine, bowel movements and semen for 2-4 weeks after the biopsy. If you have heavy bleeding or pass clots, contact your doctor or go to the Emergency Department.    If you have a temperature greater than 100 degrees or shaking chills in the first few days after the biopsy contact your doctor or go to the Emergency Department.    A Nurse practitioner from our Urology department will contact prostate biopsy patients  in approximately one week with results and you be given further instructions for follow up at that time.  If you do not receive a phone call in one week with your results please call our clinic.     If you have any questions or concerns please call:    Urology Clinic [phone]       Monday- Friday 8:00 am- 4:00 pm     Evenings and weekends and holidays   Call the Hospital Operator and ask for the Urology doctor on call: [phone]

## 2024-03-13 ENCOUNTER — Ambulatory Visit: Admit: 2024-03-13 | Discharge: 2024-03-14 | Payer: Medicare (Managed Care)

## 2024-03-13 DIAGNOSIS — R972 Elevated prostate specific antigen [PSA]: Principal | ICD-10-CM

## 2024-03-13 MED ORDER — SULFAMETHOXAZOLE 800 MG-TRIMETHOPRIM 160 MG TABLET
ORAL_TABLET | Freq: Once | ORAL | 0 refills | 1.00000 days | Status: CP
Start: 2024-03-13 — End: 2024-03-13

## 2024-03-13 MED ADMIN — lidocaine (XYLOCAINE) 10 mg/mL (1 %) injection 20 mL: 20 mL | @ 20:00:00 | Stop: 2024-03-13

## 2024-03-13 MED ADMIN — levoFLOXacin (LEVAQUIN) tablet 500 mg: 500 mg | ORAL | @ 19:00:00 | Stop: 2024-03-13

## 2024-03-13 MED ADMIN — sulfamethoxazole-trimethoprim (BACTRIM DS) 800-160 mg tablet 160 mg of trimethoprim: 1 | ORAL | @ 20:00:00 | Stop: 2024-03-13

## 2024-03-13 NOTE — Progress Notes (Signed)
 Patient came in for transrectal prostate biopsy.  Patient reports he did an enema at home before his appointment.   Consent was signed.  Patient was positioned on his left side.  Time out was performed.  Patient tolerated procedure well.  AVS given to the patient and after care instructions was explained with the patient.      The following supplies were used:      Bard Biopsy Instrument  Lot #: [id number]  Exp:01/02/2027

## 2024-03-13 NOTE — Progress Notes (Signed)
 Procedure note    Procedure:   Transrectal prostate biopsy (systematic + UroNav fusion biopsy of 3 lesions)  Ultrasonic guidance for needle placement  TRUS  Local anesthetic injection    Indication: 20M h/o T2DM on Jardiance, CAD on prasugrel , with elevated PSA and abnormal MRI   MRI-US  fusion guided biopsy recommended.  Additional detail in prior urology notes.     He previously underwent multi-parametric MRI of the prostate. The images and report are available in PACS. Both the expert radiologist and I have looked at this study carefully, including the T2 phase, the diffusion weighted imaging phase, and the dynamic contrast enhancement phase.     The mpMRI demonstrated the following lesions:  - Lesion 1: PR3, L anterior PZ apex  - Lesion 2: PR4, L anterior TZ mid  - Lesion 3: PR4, R anterior TZ mid    Preparation: Enema and antibiotic prep adequate (oral Levofloxacin 500 mg  and Bactrim ).    Resident Assistant: none    Time out was performed immediately prior to the procedure.     Procedure: The patient was placed in the left lateral decubitus position.    The electromagnetic field generator which is used to perform image guided biopsies was attached to the table and positioned over the hip. The ultrasound probe, specially fitted with an EM needle tracking guide, was placed into the rectum. The TRUS probe was first used to guide placement of a bilateral periprostatic nerve block utilizing a total of 20 cc of 1% lidocaine  bilaterally at the apex and base. During the procedure on an independent software fusion platform, the UroNav system with [employer], 3-D gland segmentation was done with the ultrasound fused to the 3-D segmentation of the MRI with a semi-automatic technique prior to targeted biopsy. The lesions that were suspicious for cancer on the preoperative MRI were called up as targets and overlaid on the real-time ultrasound image. Approximately 10 minutes was spent fusing the MRI with the transrectal ultrasound.  Targeted Cores of the MRI ROI was obtained with an 18-gauge spring loaded needle biopsy gun. I felt that sampling was successful.     The prostate was then evaluated with ultrasound:   Estimated volume: 28.59 mm x 49.01 mm x 45.95 mm, est vol 33.67 cc     Seminal vesicles: normal.   Hypoechoic lesions: none    The imaging is on file and stored in a permanent location.    Co-registration Quality:     Very Good   Targeting Quality:      Very Good   Cognitive Biopsy:      No       Next, 12 prostate biopsies were taken in the standard schematic fashion. The patient tolerated the procedure well without immediate complications.     Core summary:  Lesion 1: 3 targets  Lesion 2: 3 targets  Lesion 3: 3 targets    12 systematics    Plan:   1. F/u biopsy result  2. Prostate biopsy protocol antibiotics have been completed.   3. Postbiopsy instructions given.

## 2024-03-19 ENCOUNTER — Encounter: Admit: 2024-03-19 | Discharge: 2024-03-20 | Payer: Medicare (Managed Care)

## 2024-03-19 DIAGNOSIS — C61 Malignant neoplasm of prostate: Principal | ICD-10-CM

## 2024-03-19 NOTE — Progress Notes (Signed)
 The patient reports they are physically located in [place]  and is currently: at home. I conducted a audio/video visit. I spent  34m 30s on the video call with the patient. I spent an additional 10 minutes on pre- and post-visit activities on the date of service .                Reason for visit: Prostate biopsy results      HPI:  [name] is a 69 y.o. year old gentleman with a history of elevated psa of 8.19. Here for prostate biopsy results review    [facility] provider: [facility] provider list: Kathryn Harrawood   TRUS:  MpMRI:    PI-RADS 4 lesions seen within the anterior transitional zone, bilaterally (as detailed).   2.  Additional PI-RADS 3 lesion noted along the left anterior peripheral zone about the prostate apex, measuring up to 0.6 cm in greatest dimension with associated abutment of the urethra.   Size: 30  PSA:    Lab Results   Component Value Date/Time    PSA 8.19 (H) 12/14/2023 03:39 PM    PSA 8.68 (H) 09/13/2023 02:24 PM    PSASCRN 0.9 08/01/2013 09:12 AM       RBC, UA   Date Value Ref Range Status   06/08/2022 <1 <=3 /HPF Final         Path report:  Diagnosis   Date Value Ref Range Status   03/13/2024   Final    A: Prostate, right apex, needle core biopsy  - Benign prostate tissue  - Benign portion of seminal vesicle/ejaculatory duct.    B: Prostate, right mid, needle core biopsy  - Benign prostate tissue.    C: Prostate, right base, needle core biopsy  - Benign prostate tissue  - Benign portion of seminal vesicle/ejaculatory duct.    D: Prostate, left apex, needle core biopsy  - Benign prostate tissue.    E: Prostate, left mid, needle core biopsy  - Benign prostate tissue.    F: Prostate, left base, needle core biopsy  - Benign prostate tissue.    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    I: Prostate, target 3, needle core biopsy  -Prostatic adenocarcinoma, Gleason score 4 + 3 = 7 (Grade group 3) involving 3 of 3 cores, approximately 12, 8, 7 mm in linear extent, approximately 60% of total core length.    The pattern 4 in this case demonstrates cribriform morphology.    This electronic signature is attestation that the pathologist personally reviewed the submitted material(s) and the final diagnosis reflects that evaluation.       Diagnosis Comment   Date Value Ref Range Status   06/14/2022   Final    The presence of mild patchy lamina propria fibrosis and reactive epithelial changes are suggestive of a mild ischemic type change, if supported by a corresponding clinical impression.             Allergies[1]    Prior to Admission medications   Medication Sig  Start Date End Date Taking? Authorizing Provider   carvedilol  (COREG ) 25 MG tablet Take 1 tablet (25 mg total) by mouth in the morning and 1 tablet (25 mg total) in the evening. Take with meals. 11/14/23   Merla Calton Amble, AGNP   CONTOUR NEXT TEST STRIPS Strp  11/17/20   [provider]   empty container [facility] DISPOSAL SYSTEM) Misc Use as directed for sharps disposal 08/23/21      evolocumab  (REPATHA  SURECLICK) 140 mg/mL PnIj Inject the contents of one pen (140 mg) under the skin every fourteen (14) days. 08/24/23   Ramm, Calton Amble, AGNP   felodipine  (PLENDIL ) 10 MG 24 hr tablet TAKE 1 TABLET BY MOUTH ONCE DAILY. 10/05/23   Merla Calton Amble, AGNP   JARDIANCE 25 mg tablet Take 1 tablet (25 mg total) by mouth daily. 11/01/23   [provider]   losartan -hydroCHLOROthiazide  (HYZAAR ) 100-12.5 mg per tablet TAKE 1 TABLET BY MOUTH ONCE DAILY. 09/12/23   Merla Calton Amble, AGNP   metFORMIN  (GLUCOPHAGE ) 500 MG tablet Take 2 tablets (1,000 mg total) by mouth in the morning and 2 tablets (1,000 mg total) in the evening. Take with meals. 12/26/23 03/25/24  Alyse Slater Pao, MD   prasugrel  (EFFIENT ) 10 mg tablet Take 1 tablet (10 mg total) by mouth daily. 12/04/23   Merla Calton Amble, AGNP       Past Medical History[2]    ROS:  Review of Systems - Genito-Urinary ROS: no dysuria, trouble voiding, or hematuria     Assessment:     Spoke with George Moore  re: prostate biopsy results. We discussed pathology report including Gleason score and volume, risk stratification, and follow-up plan. He will be contacted by our scheduling team for further details. If he has not been contacted by our team in the next 5 business days he will contact our clinic.     He does not require imaging based on his risk stratification  He Has had mpMRI of the prostate  His risk stratification is Unfavorable intermediate risk                                                                                         Plan:     Multidisc appointments: Message sent to scheduling team         Urology: yes         Rad Onc: yes        Med Onc: no        Imaging:NA        GMajor candidate: no     Placed in AVS education link on prostate cancer   General PCa info: [email or web address]   Treatment Video: [email or web address]  Sexual Health Video: [email or web address]        askMUSIC - Prostate cancer pathologic outcomes ([email or web address])     He was encouraged to make note of any questions and bring them with him to his appointment.           [1]   Allergies  Allergen Reactions    Statins-Hmg-Coa Reductase Inhibitors Muscle Pain    Amoxicillin Rash    Penicillin Rash   [  2]   Past Medical History:  Diagnosis Date    Acute hypoxic respiratory failure    (CMS-HCC) 02/22/2020    Arthritis     Calculus of kidney     Diabetes mellitus (CMS-HCC)     Type II    Encounter for long-term (current) use of other medications     Hives Hypertension     Hypertrophy of prostate with urinary obstruction and other lower urinary tract symptoms (LUTS)     Hypokalemia     Hyponatremia     OSA on CPAP     Other testicular hypofunction     Pneumonia due to COVID-19 virus 02/20/2020    Renal colic     Umbilical hernia

## 2024-03-19 NOTE — Patient Instructions (Addendum)
 Sondra Peloquin:   GU cancer coordinator   Office phone: [phone]  Fax number: [phone]       Book recommendation:  [facility] on Prostate Health: Answers to Questions about Prostate Enlargement, Inflammation and Cancer  By Adelene Adolf. Royann Cords, MD and Paras H. Mason Sole, MD  [facility], 2022    Life After the Diagnosis: Expert Advice on Living Well with Serious Illness for Patients and Caregivers  By Amey Ka. Pantilat MD  [facility], 2017    Prostate Cancer Information page:  [email or web address][email or web address]    Treatment videos:   [email or web address]    Sexual Health videos:    [email or web address]    Outcomes Nomogram:   askMUSIC - Prostate cancer pathologic outcomes ([email or web address])

## 2024-03-19 NOTE — Telephone Encounter (Signed)
 Mailed avs

## 2024-03-19 NOTE — Telephone Encounter (Signed)
 Newly diagnosed prostate cancer GG3 has had prostate MrI, needs MDC visit with rad/onc and urology please    Thank you!  [place]

## 2024-03-20 DIAGNOSIS — C61 Malignant neoplasm of prostate: Principal | ICD-10-CM

## 2024-03-26 NOTE — Progress Notes (Signed)
 Radiation Oncology Consultation Note    Patient Name: George Moore  MRN: [id number]  DOB: February 03, 1956    Date of Service: 03/28/2024     Referring Physician: Powell Earnie Saras  Primary Care Provider: Alyse Slater Pao, MD    Patient Address  [address]  [facility] [id number]    Diagnosis    No primary diagnosis found.    Cancer Staging   No matching staging information was found for the patient.    Assessment    George Moore is a 69 y.o. male who presents with prostate adenocarcinoma ([id number], PSA 8.19 ng/mL, highest Gleason score 4+3=7, grade group 3, 0/12 systematic cores positive, 3/3 targeted ROIs positive, 30 mL gland by MRI, AJCC 8th Ed. Stage IIB, unfavorable intermediate risk).    Prostate MRI notes lesion abutment of the urethra and several prominent right common femoral chain lymph nodes which could be reactive, however, nodal metastasis cannot be entirely excluded.     Plan    [ ]  PSMA PET   [ ]  CK vs IMRT with 6 months ADT  If patient elects IMRT, would potentially like to get treated closer to home. Refer to Dr. Marcey Penton  Concern for CK due to lesion abutment of the urethra.   [ ]  Patient follows with GI for chronic diarrhea/constipation  [ ]  Lymph node involvement: 8%  [ ]  Will await patient decision and PSMA PET results  [ ]  Live in [place], [place]    Today we discussed the rationale, risks, benefits, and logistics of radiation therapy.  In particular, we discussed treatment options for patients with unfavorable intermediate risk prostate cancer which can include radical prostatectomy, definitive radiotherapy (including EBRT using various fractionation schema and SBRT) with or without concomitant ADT and with or without brachytherapy boost, or watchful waiting. At the end of the consultation, the patient elected to consider options.     Regarding radiotherapy treatment, should the patient elect to proceed with radiotherapy I would recommend neoadjuvant and concomitant ADT for a total of 6 months.  ADT treatment would be initiated approximately 2 to 3 months prior to definitive treatment.     We also discussed the possibility of pelvic radiotherapy followed by a brachytherapy boost given the results of a randomized trial which demonstrated improved rates of biochemical recurrence free survival with this approach compared to dose escalated radiotherapy alone (Morris et al., IJROBP 2017).  We discussed that despite these improvements, there were no observed benefits in terms of overall survival or distant metastatic disease free survival yet there was a clear increase in grade 3 urinary toxicity including a high rate of grade 3 urinary strictures (Rodda et al., IJROBP 2017).    The risks of radiation were discussed with the patient in detail.  These include, but are not limited to, common side effects such as urinary frequency, dysuria, and urgency, as well as rectal urgency, as well as lower risks of erectile dysfunction (approximately 25-35%), hematuria or stricture (2-3%), and rectal bleeding (5%).  There is less than 1% risk of bowel or bladder injury requiring surgical intervention, and a less than 1% risk of incontinence.  I indicated that a very low percentage of patients can experience urinary retention and require catheterization.  I also reviewed the low theoretical incidence of secondary malignancy with the patient and I reviewed salvage treatment options in the event of biochemical failure.    We also discussed the risks of ADT which can include limited  to hot flashes, osteoporosis, osteopenia, weight gain, fatigue, gynecomastia, emotional and cognitive changes, and low long-term risks of cardiovascular morbidity or other end-organ damage.    ADT Plan (6 months Eligard; 22.5 mg x 2 doses)    Dose 1: pending  Dose 2: pending        ----    History of Present Illness     George Moore is a 69 y.o. male who initially presented to medical attention with elevated PSA on routine screening with his PCP. He was then referred to urology for further workup.     MRI prostate on 01/17/2024 demonstrated three PI-RADS lesions, one with abutment of the urethra. Several prominent right common femoral chain lymph nodes were identified and are non specific.     Baseline Symptoms & Medications    General:  Patient has arthritis. Feels well overall    Urinary: Endorses urgency. Denies frequency, urge incontinence, stress incontinence, nocturia, weak stream, and incomplete emptying.   Nocturia: 0 times per night, occasional if consumes a lot of water  Prior Prostate Procedures: None    GI: Denies rectal pain, or bleeding. Has occasional diarrhea, goes through cycles of days where he has diarrhea/loose stool 3-4x/day. See Edsel Graven PA (GI)    Colonoscopy: Most recent in 2024, tubular adenoma. Unknown when he is supposed to return  Inflammatory bowel disease: No  Anticoagulation: Yes, prasugrel     Sexual Function: has difficulty attaining erections and has difficulty maintaining erections  Hot Flashes 0 times per day    Alpha Antagonists: None  Analgesics: None  5a Reductase Inhibitors: None  Antispasmodics: None  Bowel Agents: None  PDE5i: None  Other (GI/GU) medications: Magnesium for loose stools    Prior Radiation Therapy:  No  Pacemaker:  No  Pregnancy: The patient is male.    Diarrhea - Grade 1 - Increase of < 4 stools per day over baseline; mild increase in ostomy output compared to baseline  Proctitis - Grade 0 - Baseline  Fatigue - Grade 0 - Baseline  Hot flashes - Grade 0 - Baseline  Dermatitis radiation - Grade 0 - Baseline  Urinary frequency - Grade 0 - Baseline  Urinary incontinence - Grade 0 - Baseline  Urinary retention - Grade 0 - Baseline  Urinary tract pain - Grade 0 - Baseline  Urinary urgency - Grade 0 - Baseline  Bladder spasm - Grade 0 - Baseline        Past Medical History    The patient  has a past medical history of Acute hypoxic respiratory failure (CMS-HCC) (02/22/2020), Arthritis, Calculus of kidney, Diabetes mellitus (CMS-HCC), Encounter for long-term (current) use of other medications, Hives, Hypertension, Hypertrophy of prostate with urinary obstruction and other lower urinary tract symptoms (LUTS), Hypokalemia, Hyponatremia, OSA on CPAP, Other testicular hypofunction, Pneumonia due to COVID-19 virus (02/20/2020), Renal colic, and Umbilical hernia.     Past Surgical History    The patient  has a past surgical history that includes Arthroscopy knee w/ drilling; Extracorporeal shock wave lithotripsy (Right, 12/06/2007); pr colonoscopy w/biopsy single/multiple (N/A, 06/14/2022); Nasal septum surgery; pr cath place/coron angio, img super/interp,w left heart ventriculography (N/A, 10/26/2022); Umbilical hernia repair (10/24/2013); pr cath place/coron angio, img super/interp,w left heart ventriculography (N/A, 02/15/2023); Nose surgery (1988); Cardiothoracic procedure (8/24); Other surgical history (2011); Wisdom tooth extraction (?); Coronary stent placement (2024); and Skin biopsy.     Medications    The patient has a current medication list which includes the following prescription(s): carvedilol , contour  next test strips, empty container, repatha  sureclick, felodipine , jardiance, losartan -hydrochlorothiazide , metformin , and prasugrel .    Allergies    Statins-hmg-coa reductase inhibitors, Amoxicillin, and Penicillin     Social History    Semi-retired. Works at [employer]. 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 Presents with daughter  Social History     Tobacco Use    Smoking status: Never     Passive exposure: Past    Smokeless tobacco: Former     Types: Snuff     Quit date: 07/18/1987    Tobacco comments:     Never habitually use   Substance Use Topics    Alcohol use: Not Currently     Comment: Glass of wine, drink of beer, shot of whiskey, maybe 4 a yea        Family History  His family history includes Hypertension in his brother; Nephrolithiasis in his father and sister; Stroke in his father and mother.  He He indicated that his mother is deceased. He indicated that his father is deceased. He indicated that the status of his sister is unknown. He indicated that the status of his brother is unknown. He indicated that the status of his neg hx is unknown.    family history includes Hypertension in his brother; Nephrolithiasis in his father and sister; Stroke in his father and mother.    Mother - colon cancer   Grandmother - lung cancer  Father was adopted, no medical history avialable. Had T2DM, throat cancer    Review of Systems (Positives are underlined)      Constitutional: Weight changes, fatigue, fevers, chills, night sweats  HEENT:  Headaches, vision or hearing changes, tinnitus, lightheadedness, dizziness, epistaxis, hoarseness, sore throat, dysphagia  CV:  Chest pain, pressure, palpitations, dyspnea on exertion, orthopnea, claudication, edema  Resp: Cough, wheezing, hemoptysis   GI:  Nausea, vomiting, diarrhea, abdominal pain, jaundice, constipation, rectal bleeding, melena  GU:  Dysuria, urinary frequency, urgency, hematuria, incontinence  MSK:  Muscle weakness or pain, decreased range of motion  Neuro:  Focal weakness, numbness, tingling, tremors, seizures, memory problems  Endo:  Heat or cold intolerance, polyuria, polydipsia, tremor  Skin:  New skin lesions, rashes  Heme: Easy bruising, easy bleeding, lymphadenopathy  Psychiatric: Anxiety, depression    Physical Examination    Wt Readings from Last 1 Encounters:   03/28/24 98.9 kg (218 lb)     Temp Readings from Last 1 Encounters:   03/28/24 36.2 ??C (97.2 ??F) (Temporal)     BP Readings from Last 1 Encounters:   03/28/24 (!) 158/73     Pulse Readings from Last 1 Encounters:   03/13/24 56     SpO2 Readings from Last 1 Encounters:   03/13/24 96%      General:  The patient is sitting comfortably in no acute distress.  HEENT:  Normocephalic, atraumatic  Lymph:  No palpable lymphadenopathy   CV:  Regular rate and rhythm  Resp:  Good inspiratory effort  Abd:  Soft, non-distended  Back:  No bony tenderness  Ext:  No clubbing, cyanosis, or edema  Neuro:  CN II-XII grossly intact and symmetric, no focal neurologic deficits  Skin:  No skin lesions were noted  DRE: Not performed    ECOG Performance Status: 0 = Fully active, able to carry on all pre-disease performance without restriction    Labs    PSA   Date Value Ref Range Status   12/14/2023 8.19 (H) 0.00 - 4.00 ng/mL Final   09/13/2023 8.68 (H)  0.00 - 4.00 ng/mL Final     PSA Screening   Date Value Ref Range Status   08/01/2013 0.9 0.0 - 4.0 ng/mL Final     Comment:     Roche ECLIA methodology.  According to the American Urological Association, Serum PSA should  decrease and remain at undetectable levels after radical  prostatectomy. The AUA defines biochemical recurrence as an initial  PSA value 0.2 ng/mL or greater followed by a subsequent confirmatory  PSA value 0.2 ng/mL or greater.  Values obtained with different assay methods or kits cannot be used  interchangeably. Results cannot be interpreted as absolute evidence  of the presence or absence of malignant disease.       Testosterone   Date Value Ref Range Status   08/01/2013 409 348 - 1,197 ng/dL Final       WBC   Date Value Ref Range Status   12/26/2023 6.3 3.6 - 11.2 10*9/L Final   09/13/2023 7.4 3.6 - 11.2 10*9/L Final   02/13/2023 8.4 3.6 - 11.2 10*9/L Final     Absolute Neutrophils   Date Value Ref Range Status   06/09/2022 6.8 1.8 - 7.8 10*9/L Final   06/08/2022 8.0 (H) 1.8 - 7.8 10*9/L Final   10/04/2019 4.9 1.7 - 7.7 10*9/L Final     HGB   Date Value Ref Range Status   12/26/2023 14.6 12.9 - 16.5 g/dL Final   92/90/7974 85.0 12.9 - 16.5 g/dL Final   87/90/7975 84.8 12.9 - 16.5 g/dL Final   94/71/7984 83.9 12.6 - 17.7 g/dL Final     Platelet   Date Value Ref Range Status   12/26/2023 156 150 - 450 10*9/L Final   09/13/2023 145 (L) 150 - 450 10*9/L Final   02/13/2023 144 (L) 150 - 450 10*9/L Final Creatinine Whole Blood, POC   Date Value Ref Range Status   10/14/2022 1.1 0.8 - 1.4 mg/dL Final   92/76/7975 1.3 0.8 - 1.4 mg/dL Final     Creatinine   Date Value Ref Range Status   12/26/2023 0.94 0.73 - 1.18 mg/dL Final   92/90/7974 8.93 0.73 - 1.18 mg/dL Final   87/90/7975 9.00 0.73 - 1.18 mg/dL Final       Imaging    MRI Prostate W Wo Contrast  Result Date: 01/18/2024   Impression: 1.  PI-RADS 4 lesions seen within the anterior transitional zone, bilaterally (as detailed). 2.  Additional PI-RADS 3 lesion noted along the left anterior peripheral zone about the prostate apex, measuring up to 0.6 cm in greatest dimension with associated abutment of the urethra. 3.  Note made of several prominent right common femoral chain lymph nodes, which measure up to 1.0 cm in short axis. These are nonspecific and could potentially be reactive in nature, however, any potential sites of early nodal metastasis or not entirely excluded (given the above). Continued attention is recommended on follow-up. PI-RADSregistered v2.1 Assessment Categories PI-RADS 1 - Very low (clinically significant cancer is highly unlikely to be present) PI-RADS 2 - Low (clinically significant cancer is unlikely to be present) PI-RADS 3 - Intermediate (the presence of clinically significant cancer is equivocal) PI-RADS 4 - High (clinically significant cancer is likely to be present) PI-RADS 5 - Very high (clinically significant cancer is highly likely to be present      Pathology    The patient underwent a transrectal ultrasound guided biopsy of the prostate on 03/13/2024 with MRI fusion and targeted biopsies which demonstrated:  Lab Results   Component Value Date    FINALDX  03/13/2024     A: Prostate, right apex, needle core biopsy  - Benign prostate tissue  - Benign portion of seminal vesicle/ejaculatory duct.    B: Prostate, right mid, needle core biopsy  - Benign prostate tissue.    C: Prostate, right base, needle core biopsy  - Benign prostate tissue  - Benign portion of seminal vesicle/ejaculatory duct.    D: Prostate, left apex, needle core biopsy  - Benign prostate tissue.    E: Prostate, left mid, needle core biopsy  - Benign prostate tissue.    F: Prostate, left base, needle core biopsy  - Benign prostate tissue.    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    I: Prostate, target 3, needle core biopsy  -Prostatic adenocarcinoma, Gleason score 4 + 3 = 7 (Grade group 3) involving 3 of 3 cores, approximately 12, 8, 7 mm in linear extent, approximately 60% of total core length.    The pattern 4 in this case demonstrates cribriform morphology.    This electronic signature is attestation that the pathologist personally reviewed the submitted material(s) and the final diagnosis reflects that evaluation.         Genomic Biomarker Testing  None      [facility] Nomogram Estimates    Organ-confined disease: 52%  Extracapsular extension: 44%  Lymph node involvement: 8%  Seminal vesicle invasion: 6%    ---  Laneta Minder, PA-C  Radiation Oncology

## 2024-03-27 NOTE — Progress Notes (Signed)
 REFERRING PROVIDER: Powell Earnie Saras    PCP: Alyse Slater Pao, MD    ASSESSMENT:  George Moore is a 69 y.o. 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         I explained the difference between favorable and unfavorable intermediate risk disease, such that favorable disease includes GG 1 or 2, <50% biopsy cores positive and only 1 intermediate risk factor (e.g. PSA 10-20, cT2b-2c, or GG 2 or 3).     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 Imaging reveals prostate MRI as above. PSMA PET pending.     Preferred treatment options for unfavorable intermediate risk disease rather would include radical prostatectomy +/- pelvic lymph node dissection versus EBRT radiation therapy + 4-6 months of androgen deprivation (hormonal) therapy with or without brachytherapy.     Regarding radiation therapy, the patient will be speaking with our radiation oncology colleagues to discuss these modalities further.     Regarding prostatectomy, I described my operative approach with robotic radical prostatectomy. Given intermediate risk disease, I would not typically recommend lymph node dissection. I described benefits and risks of the operation including but not limited to risks of anesthesia (e.g. heart attack, stroke, blood clot, pulmonary embolism, pneumonia), surgical bleeding, infection, urine leak, urethral stricture, bowel injury, injury to other adjacent organs, and death from surgery.      I also discussed the two main long-term functional issues, erectile dysfunction and continence, the prolonged period of time involved in the recovery of urinary control and erectile function, and various modes of therapy that can be used to help during this period. I offered data from my experience to give him a sense of what he can expect at 3, 6, and 12 months after surgery. I discussed in detail the modalities for treating ED and/or rehabilitation of erectile function. I reassured him that there are many tools available to help him with erectile function. I emphasized the importance of kegel exercises to help with continence, and asked him to learn these prior to surgery if he elects prostatectomy. I further detailed the perioperative period, including surgical time, drain, foley catheter placement for a week, a 1 day hospitalization, pain management protocols, discharge criteria, and follow up surveillance regimens. Given abdominal girth, we discussed that he would likely be at a higher risk of incontinence with surgery. We also discussed the higher risk of cardiac complication with surgery given cardiac history.        PLAN:    PSMA PET scan (ordered by rad onc)  Patient is considering his options and will let us  know what he decides.  If decides on surgery, would need cardiac clearance and anticoagulation recommendations.    All questions were answered to the apparent satisfaction of the patient and his daughter.    This visit fulfills criteria for use of G2211 modifier as an encounter related to ongoing medical care related to the patient's single, serious condition or complex condition.       REASON FOR VISIT:   Intermediate risk prostate cancer  HISTORY OF PRESENT ILLNESS:   George Moore is a 69 y.o. male who comes in today in consultation at the request of Powell Earnie Saras for intermediate risk prostate cancer.     Chronologic history as follows:    Lab Results   Component Value Date/Time    PSA 8.19 (H) 12/14/2023 03:39 PM    PSA 8.68 (H) 09/13/2023 02:24 PM    PSASCRN 0.9 08/01/2013 09:12 AM     01/17/2024: Prostate MRI with bilateral PIRADS4 lesion; PIRADS3, indeterminate prominent right common femoral chain LNS up to 1.0 cm  03/13/2024: Gleason Grade Group 3 in 1/15 cores; additional 2 cores 3+3 (target counted as 1 core per convention)      No significant LUTS.    He reports erectile dysfunction.    Past abdominal surgical history: umbilical hernia repair  Cardiac history: history of cardiac stent -- taking prasugrel  for anticoagulation  Anticoagulation: prasugrel   History of TURP / other prostate surgical history: denies    He does not have a family history for prostate cancer.    Expanded Prostate Cancer Index Composite (Epic-CP) Results This Visit    Score > 4 is clinically meaningful     Urinary Incontinence : (Patient-Rptd) 0  (03/23/24)  out of 12    Urinary Irritation/Obstructive : (Patient-Rptd) 0  (03/23/24)  out of 12    Bowel: (Patient-Rptd) 1  (03/23/24)  out of 12    Sexual Function : (Patient-Rptd) 11  (03/23/24)  out of 12    Vitality/Hormonal: (Patient-Rptd) 1  (03/23/24)  out of 12    Overall Prostate Cancer QOL : (Patient-Rptd) 13  (03/23/24)  out of 60    Change in last 2 scores   (Positive number = worsening symptoms)   Domain Change in Score    Incontinence   0   Change is significant if INCREASE is > 1   Irritation/Obstructive   0    Bowel   0    Sexual   0    Vitality/Hormonal   0      Patient completed and provider reviewed patient-reported outcome questionnaire      PAST MEDICAL HISTORY:   Past Medical History[1]    PAST SURGICAL HISTORY:   Past Surgical History[2]    MEDICATIONS:  Current Medications[3]    ALLERGIES:  Allergies[4]    FAMILY HISTORY:  family history includes Hypertension in his brother; Nephrolithiasis in his father and sister; Stroke in his father and mother.    SOCIAL HISTORY:   reports that he has never smoked. He has been exposed to tobacco smoke. He quit smokeless tobacco use about 36 years ago.  His smokeless tobacco use included snuff. He reports that he does not currently use alcohol. He reports that he does not use drugs.    PHYSICAL EXAM:  GENERAL: Pleasant male in no acute distress.   VITAL SIGNS: There were no vitals taken for this visit.  PULMONARY: Normal work of breathing, no use of accessory muscles  ABDOMEN: Soft, non-tender, non-distended. No organomegaly or hernias.  PSYCHOLOGIC: Normal affect, normal mood    REVIEW OF SYSTEMS:  Negative upon 10-system review other than what is mentioned in the HPI.     IMAGING: Personally reviewed.  MRI Prostate W Wo Contrast  Narrative: EXAM: MRI PROSTATE W WO CONTRAST (PROSTATE PROTOCOL)  ACCESSION: [id number] UN  CLINICAL INDICATION: elevated PSA  - R97.20 - Elevated PSA      COMPARISON: None    TECHNIQUE: Multisequence and multiplanar  magnetic resonance imaging of the pelvis was performed with and without intravenous contrast with prostate cancer protocol. .    CONTRAST: 9.8 mL of Gadopiclenol     Clinical history of prostate cancer: None  Prior biopsy: Unknown  Most recent PSA: 8.2 ng/mL    FINDINGS:    QUALITY: Adequate    HEMORRHAGE: Absent    PROSTATE SIZE: Prostate measures 4.9 x 3.5 x 3.5 cm with calculated total volume of 30 cc.    PERIPHERAL ZONE: Scattered areas of heterogeneous T2 signal hypointensity which may reflect sequela of prostatitis or scarring. Suspicious lesions as below:    Lesion 1:  Size: 0.5 x 0.2 x 0.6 cm  Location: Left anterior peripheral zone along the level of the prostate apex. Associated abutment of the urethra.  T2: Mild ill-defined low T2 signal PI-RADS: 3  DWI: Marked hypointense signal on ADC map (8:16; 9:16) with mild signal hyperintensity on DWI PI-RADS: 3  DCE: Negative  Prostate margin: Does abut the prostate margin  Extracapsular Extension: Absent  Overall Lesion PI-RADS category: 3    TRANSITIONAL ZONE: Central gland enlargement with changes of benign prostatic hypertrophy. Suspicious lesions as below:    Lesion 2:  Size: 1.0 x 0.6 x 0.9 cm  Location: Left anterior transition zone along the level of the prostate mid gland  T2: Ill-defined moderate low T2 signal hypointensity (4:12) PI-RADS: 4  DWI: Marked low signal on ADC map with corresponding hyperintensity on DWI PI-RADS: 4  DCE: Positive  Prostate margin: Does not abut the prostate margin  Extracapsular Extension: Absent  Overall Lesion PI-RADS category: 4    Lesion 3:  Size: 1.6 x 1.0 x 0.9 cm  Location: Right anterior transitional zone along the level of the prostate mid gland to apex.  T2: Heterogeneous T2 signal hypointensity with obscured margins (4:14) PI-RADS: 3  DWI: Marked low signal on ADC map with corresponding hyperintensity on diffusion weighted imaging PI-RADS: 5  DCE: Positive  Prostate margin: Does abut the prostate margin  Extracapsular Extension: Absent  Overall Lesion PI-RADS category: 4    SEMINAL VESICLES: Unremarkable. Seminal vesicles are symmetric in appearance.    NEUROVASCULAR BUNDLES: Unremarkable.    BLADDER: Urinary bladder demonstrates mild circumferential wall thickening with scattered trabeculations, which may reflect sequelae of chronic bladder outlet obstruction (given prostamegaly).    LYMPH NODES: No several prominent right common femoral chain lymph nodes are identified (4:2-5), which measure up to 1.0 cm in short axis and are felt indeterminate. Any potential sites of early nodal metastasis or not entirely excluded.    OTHER: No pelvic free fluid or drainable collection.    VESSELS: The visualized pelvic vasculature appears to be patent.    LARGE FIELD-OF-VIEW: No findings of bowel obstruction on large field-of-view imaging.    BONES AND SOFT TISSUES: Bilateral fat-containing inguinal hernias. No worrisome osseous lesion is identified.  Impression: 1.  PI-RADS 4 lesions seen within the anterior transitional zone, bilaterally (as detailed).  2.  Additional PI-RADS 3 lesion noted along the left anterior peripheral zone about the prostate apex, measuring up to 0.6 cm in greatest dimension with associated abutment of the urethra.  3.  Note made of several prominent right common femoral chain lymph nodes, which measure up to 1.0 cm in short axis. These are nonspecific and could potentially be reactive in nature, however, any potential sites of early nodal metastasis or not entirely excluded (given the above). Continued attention is recommended on follow-up.    PI-RADSregistered v2.1 Assessment  Categories  PI-RADS 1 - Very low (clinically significant cancer is highly unlikely to be present)  PI-RADS 2 - Low (clinically significant cancer is unlikely to be present)  PI-RADS 3 - Intermediate (the presence of clinically significant cancer is equivocal)  PI-RADS 4 - High (clinically significant cancer is likely to be present)  PI-RADS 5 - Very high (clinically significant cancer is highly likely to be present      PATHOLOGY:  Diagnosis   Date Value Ref Range Status   03/13/2024   Final    A: Prostate, right apex, needle core biopsy  - Benign prostate tissue  - Benign portion of seminal vesicle/ejaculatory duct.    B: Prostate, right mid, needle core biopsy  - Benign prostate tissue.    C: Prostate, right base, needle core biopsy  - Benign prostate tissue  - Benign portion of seminal vesicle/ejaculatory duct.    D: Prostate, left apex, needle core biopsy  - Benign prostate tissue.    E: Prostate, left mid, needle core biopsy  - Benign prostate tissue.    F: Prostate, left base, needle core biopsy  - Benign prostate tissue.    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    I: Prostate, target 3, needle core biopsy  -Prostatic adenocarcinoma, Gleason score 4 + 3 = 7 (Grade group 3) involving 3 of 3 cores, approximately 12, 8, 7 mm in linear extent, approximately 60% of total core length.    The pattern 4 in this case demonstrates cribriform morphology.    This electronic signature is attestation that the pathologist personally reviewed the submitted material(s) and the final diagnosis reflects that evaluation.            [1]   Past Medical History:  Diagnosis Date    Acute hypoxic respiratory failure    (CMS-HCC) 02/22/2020    Arthritis     Calculus of kidney     Diabetes mellitus (CMS-HCC)     Type II    Encounter for long-term (current) use of other medications     Hives     Hypertension     Hypertrophy of prostate with urinary obstruction and other lower urinary tract symptoms (LUTS)     Hypokalemia     Hyponatremia     OSA on CPAP     Other testicular hypofunction     Pneumonia due to COVID-19 virus 02/20/2020    Renal colic     Umbilical hernia    [2]   Past Surgical History:  Procedure Laterality Date    ARTHROSCOPY KNEE W/ DRILLING      CARDIOTHORACIC PROCEDURE  8/24    CORONARY STENT PLACEMENT  2024    EXTRACORPOREAL SHOCK WAVE LITHOTRIPSY Right 12/06/2007    NASAL SEPTUM SURGERY      NOSE SURGERY  1988    OTHER SURGICAL HISTORY  2011    Knee, maniscus    PR CATH PLACE/CORON ANGIO, IMG SUPER/INTERP,W LEFT HEART VENTRICULOGRAPHY N/A 10/26/2022    Procedure: Left Heart Catheterization With Intervention;  Surgeon:  Gil Franky Barter, MD;  Location: [facility] CATH;  Service: Cardiology    PR CATH PLACE/CORON ANGIO, IMG SUPER/INTERP,W LEFT HEART VENTRICULOGRAPHY N/A 02/15/2023    Procedure: Left Heart Catheterization With Intervention;  Surgeon: Gil Franky Barter, MD;  Location: [facility] CATH;  Service: Cardiology    PR COLONOSCOPY W/BIOPSY SINGLE/MULTIPLE N/A 06/14/2022    Procedure: COLONOSCOPY, FLEXIBLE, PROXIMAL TO SPLENIC FLEXURE; WITH BIOPSY, SINGLE OR MULTIPLE; Surgeon: Gwenyth Barter Agent, MD;  [facility]: [facility];  Service: Gastroenterology    SKIN BIOPSY      10+ yesrs ago on the [place]    UMBILICAL HERNIA REPAIR  10/24/2013    w/ INSERTION OF MESH    WISDOM TOOTH EXTRACTION  ?   [3]   Current Outpatient Medications   Medication Sig Dispense Refill    carvedilol  (COREG ) 25 MG tablet Take 1 tablet (25 mg total) by mouth in the morning and 1 tablet (25 mg total) in the evening. Take with meals. 180 tablet 2    CONTOUR NEXT TEST STRIPS Strp       empty container (SHARPS-A-GATOR DISPOSAL SYSTEM) Misc Use as directed for sharps disposal 1 each 2    evolocumab  (REPATHA  SURECLICK) 140 mg/mL PnIj Inject the contents of one pen (140 mg) under the skin every fourteen (14) days. 2 mL 11    felodipine  (PLENDIL ) 10 MG 24 hr tablet TAKE 1 TABLET BY MOUTH ONCE DAILY. 90 tablet 1    JARDIANCE 25 mg tablet Take 1 tablet (25 mg total) by mouth daily.      losartan -hydroCHLOROthiazide  (HYZAAR ) 100-12.5 mg per tablet TAKE 1 TABLET BY MOUTH ONCE DAILY. 90 tablet 3    metFORMIN  (GLUCOPHAGE ) 500 MG tablet Take 2 tablets (1,000 mg total) by mouth in the morning and 2 tablets (1,000 mg total) in the evening. Take with meals. 360 tablet 1    prasugrel  (EFFIENT ) 10 mg tablet Take 1 tablet (10 mg total) by mouth daily. 90 tablet 3     No current facility-administered medications for this visit.   [4]   Allergies  Allergen Reactions    Statins-Hmg-Coa Reductase Inhibitors Muscle Pain    Amoxicillin Rash    Penicillin Rash

## 2024-03-28 ENCOUNTER — Ambulatory Visit
Admit: 2024-03-28 | Discharge: 2024-03-29 | Payer: Medicare (Managed Care) | Attending: Student in an Organized Health Care Education/Training Program | Primary: Student in an Organized Health Care Education/Training Program

## 2024-03-28 ENCOUNTER — Ambulatory Visit: Admit: 2024-03-28 | Discharge: 2024-03-29 | Payer: Medicare (Managed Care)

## 2024-03-28 DIAGNOSIS — C61 Malignant neoplasm of prostate: Principal | ICD-10-CM

## 2024-03-29 NOTE — Progress Notes (Signed)
 Introductory visit with patient. Pt here to see Dr. Avram to discuss management of prostate cancer. I provided the patient with my contact information and discussed my role as their nurse navigator. New patient resource list provided and reviewed. Plan for PSMA per rad onc, referral to [facility] for radiation. Lauraine DOROTHA Brooks, RN

## 2024-04-07 ENCOUNTER — Ambulatory Visit: Admit: 2024-04-07 | Payer: Medicare (Managed Care)

## 2024-04-22 ENCOUNTER — Ambulatory Visit: Admitting: Radiation Oncology
